# Patient Record
Sex: Male | Born: 1975 | Race: White | Hispanic: No | Marital: Married | Smoking: Former smoker
Health system: Southern US, Community
[De-identification: ages and names within clinical notes are randomized; demographics above are authoritative.]

## PROBLEM LIST (undated history)

## (undated) DIAGNOSIS — E119 Type 2 diabetes mellitus without complications: Secondary | ICD-10-CM

## (undated) DIAGNOSIS — Z9989 Dependence on other enabling machines and devices: Secondary | ICD-10-CM

## (undated) DIAGNOSIS — G4733 Obstructive sleep apnea (adult) (pediatric): Secondary | ICD-10-CM

## (undated) DIAGNOSIS — R519 Headache, unspecified: Secondary | ICD-10-CM

## (undated) DIAGNOSIS — I10 Essential (primary) hypertension: Secondary | ICD-10-CM

## (undated) DIAGNOSIS — G8929 Other chronic pain: Secondary | ICD-10-CM

## (undated) DIAGNOSIS — R51 Headache: Secondary | ICD-10-CM

## (undated) HISTORY — DX: Headache, unspecified: R51.9

## (undated) HISTORY — DX: Obstructive sleep apnea (adult) (pediatric): G47.33

## (undated) HISTORY — PX: APPENDECTOMY: SHX54

## (undated) HISTORY — DX: Type 2 diabetes mellitus without complications: E11.9

## (undated) HISTORY — DX: Essential (primary) hypertension: I10

## (undated) HISTORY — DX: Dependence on other enabling machines and devices: Z99.89

## (undated) HISTORY — DX: Headache: R51

## (undated) HISTORY — DX: Other chronic pain: G89.29

---

## 2008-10-06 LAB — HM DIABETES EYE EXAM: HM Diabetic Eye Exam: NORMAL

## 2009-01-28 ENCOUNTER — Ambulatory Visit (HOSPITAL_BASED_OUTPATIENT_CLINIC_OR_DEPARTMENT_OTHER): Admission: RE | Admit: 2009-01-28 | Discharge: 2009-01-28 | Payer: Self-pay | Admitting: Neurology

## 2009-01-28 ENCOUNTER — Encounter: Payer: Self-pay | Admitting: Internal Medicine

## 2009-02-05 ENCOUNTER — Ambulatory Visit: Payer: Self-pay | Admitting: Internal Medicine

## 2009-03-04 ENCOUNTER — Ambulatory Visit: Payer: Self-pay | Admitting: Internal Medicine

## 2009-03-04 DIAGNOSIS — G4730 Sleep apnea, unspecified: Secondary | ICD-10-CM

## 2009-03-04 DIAGNOSIS — G4710 Hypersomnia, unspecified: Secondary | ICD-10-CM | POA: Insufficient documentation

## 2009-03-11 DIAGNOSIS — J309 Allergic rhinitis, unspecified: Secondary | ICD-10-CM | POA: Insufficient documentation

## 2009-03-25 ENCOUNTER — Encounter: Payer: Self-pay | Admitting: Internal Medicine

## 2009-04-07 ENCOUNTER — Encounter: Payer: Self-pay | Admitting: Internal Medicine

## 2009-04-12 ENCOUNTER — Ambulatory Visit: Payer: Self-pay | Admitting: Internal Medicine

## 2009-04-12 DIAGNOSIS — I10 Essential (primary) hypertension: Secondary | ICD-10-CM

## 2009-04-12 DIAGNOSIS — I152 Hypertension secondary to endocrine disorders: Secondary | ICD-10-CM | POA: Insufficient documentation

## 2009-04-12 DIAGNOSIS — E1159 Type 2 diabetes mellitus with other circulatory complications: Secondary | ICD-10-CM | POA: Insufficient documentation

## 2009-04-12 LAB — CONVERTED CEMR LAB
AST: 34 units/L (ref 0–37)
Alkaline Phosphatase: 58 units/L (ref 39–117)
Basophils Absolute: 0.1 10*3/uL (ref 0.0–0.1)
Bilirubin, Direct: 0.2 mg/dL (ref 0.0–0.3)
CO2: 29 meq/L (ref 19–32)
Chloride: 105 meq/L (ref 96–112)
Creatinine, Ser: 0.7 mg/dL (ref 0.4–1.5)
HCT: 41.1 % (ref 39.0–52.0)
Hemoglobin: 14 g/dL (ref 13.0–17.0)
Lymphocytes Relative: 26 % (ref 12.0–46.0)
MCHC: 34.1 g/dL (ref 30.0–36.0)
MCV: 86.8 fL (ref 78.0–100.0)
Monocytes Relative: 9.6 % (ref 3.0–12.0)
Potassium: 3.8 meq/L (ref 3.5–5.1)
RBC: 4.74 M/uL (ref 4.22–5.81)
Specific Gravity, Urine: >=1.03 (ref 1.000–1.030)
TSH: 1.21 microintl units/mL (ref 0.35–5.50)
Total Bilirubin: 0.3 mg/dL (ref 0.3–1.2)
Urine Glucose: NEGATIVE mg/dL
Urobilinogen, UA: 0.2 (ref 0.0–1.0)
pH: 5.5 (ref 5.0–8.0)

## 2009-04-25 ENCOUNTER — Encounter (INDEPENDENT_AMBULATORY_CARE_PROVIDER_SITE_OTHER): Payer: Self-pay | Admitting: *Deleted

## 2009-04-25 ENCOUNTER — Ambulatory Visit: Payer: Self-pay | Admitting: Internal Medicine

## 2009-05-10 ENCOUNTER — Encounter: Payer: Self-pay | Admitting: Internal Medicine

## 2009-05-26 ENCOUNTER — Encounter: Payer: Self-pay | Admitting: Internal Medicine

## 2009-06-03 ENCOUNTER — Ambulatory Visit: Payer: Self-pay | Admitting: Internal Medicine

## 2009-06-03 DIAGNOSIS — D485 Neoplasm of uncertain behavior of skin: Secondary | ICD-10-CM | POA: Insufficient documentation

## 2009-06-18 ENCOUNTER — Encounter: Payer: Self-pay | Admitting: Internal Medicine

## 2009-06-21 ENCOUNTER — Telehealth: Payer: Self-pay | Admitting: Internal Medicine

## 2009-06-28 ENCOUNTER — Ambulatory Visit: Payer: Self-pay | Admitting: Internal Medicine

## 2009-06-28 LAB — CONVERTED CEMR LAB
BUN: 6 mg/dL (ref 6–23)
Chloride: 103 meq/L (ref 96–112)
Direct LDL: 104.7 mg/dL
Hgb A1c MFr Bld: 6.5 % (ref 4.6–6.5)
Potassium: 4.2 meq/L (ref 3.5–5.1)
VLDL: 64.8 mg/dL — ABNORMAL HIGH (ref 0.0–40.0)

## 2009-08-16 ENCOUNTER — Ambulatory Visit: Payer: Self-pay | Admitting: Internal Medicine

## 2009-08-16 DIAGNOSIS — F329 Major depressive disorder, single episode, unspecified: Secondary | ICD-10-CM

## 2009-08-16 DIAGNOSIS — F3289 Other specified depressive episodes: Secondary | ICD-10-CM | POA: Insufficient documentation

## 2009-08-16 LAB — CONVERTED CEMR LAB
Leukocytes, UA: NEGATIVE
Nitrite: NEGATIVE
Specific Gravity, Urine: >=1.03 (ref 1.000–1.030)
Urine Glucose: >=1000 mg/dL
Urobilinogen, UA: 0.2 (ref 0.0–1.0)

## 2009-08-25 ENCOUNTER — Ambulatory Visit: Payer: Self-pay | Admitting: Professional

## 2009-09-21 ENCOUNTER — Encounter: Payer: Self-pay | Admitting: Internal Medicine

## 2009-09-28 ENCOUNTER — Ambulatory Visit: Payer: Self-pay | Admitting: Internal Medicine

## 2009-09-28 ENCOUNTER — Encounter (INDEPENDENT_AMBULATORY_CARE_PROVIDER_SITE_OTHER): Payer: Self-pay | Admitting: *Deleted

## 2009-09-28 LAB — CONVERTED CEMR LAB
ALT: 48 units/L (ref 0–53)
AST: 35 units/L (ref 0–37)
Basophils Relative: 0.5 % (ref 0.0–3.0)
Bilirubin Urine: NEGATIVE
Bilirubin, Direct: 0.1 mg/dL (ref 0.0–0.3)
Chloride: 103 meq/L (ref 96–112)
Direct LDL: 105 mg/dL
Eosinophils Relative: 5.5 % — ABNORMAL HIGH (ref 0.0–5.0)
HCT: 42.4 % (ref 39.0–52.0)
Hgb A1c MFr Bld: 6.6 % — ABNORMAL HIGH (ref 4.6–6.5)
Ketones, ur: NEGATIVE mg/dL
Leukocytes, UA: NEGATIVE
Lymphs Abs: 1.9 10*3/uL (ref 0.7–4.0)
MCV: 86.7 fL (ref 78.0–100.0)
Monocytes Absolute: 0.6 10*3/uL (ref 0.1–1.0)
Monocytes Relative: 8.8 % (ref 3.0–12.0)
Neutrophils Relative %: 58.3 % (ref 43.0–77.0)
Potassium: 4.2 meq/L (ref 3.5–5.1)
RBC: 4.89 M/uL (ref 4.22–5.81)
Sodium: 143 meq/L (ref 135–145)
Total CHOL/HDL Ratio: 6
Total Protein: 8 g/dL (ref 6.0–8.3)
Urine Glucose: NEGATIVE mg/dL
VLDL: 55 mg/dL — ABNORMAL HIGH (ref 0.0–40.0)
WBC: 7.2 10*3/uL (ref 4.5–10.5)
pH: 6 (ref 5.0–8.0)

## 2009-09-29 ENCOUNTER — Encounter: Payer: Self-pay | Admitting: Internal Medicine

## 2009-10-03 ENCOUNTER — Encounter: Payer: Self-pay | Admitting: Internal Medicine

## 2009-10-03 ENCOUNTER — Ambulatory Visit: Payer: Self-pay | Admitting: Internal Medicine

## 2009-10-03 DIAGNOSIS — J45909 Unspecified asthma, uncomplicated: Secondary | ICD-10-CM | POA: Insufficient documentation

## 2009-10-03 DIAGNOSIS — R05 Cough: Secondary | ICD-10-CM

## 2009-10-03 DIAGNOSIS — R059 Cough, unspecified: Secondary | ICD-10-CM | POA: Insufficient documentation

## 2009-10-07 ENCOUNTER — Ambulatory Visit: Payer: Self-pay | Admitting: Internal Medicine

## 2009-11-01 ENCOUNTER — Telehealth: Payer: Self-pay | Admitting: Internal Medicine

## 2009-11-10 ENCOUNTER — Telehealth: Payer: Self-pay | Admitting: Internal Medicine

## 2010-03-16 ENCOUNTER — Emergency Department (HOSPITAL_COMMUNITY)
Admission: EM | Admit: 2010-03-16 | Discharge: 2010-03-16 | Payer: Self-pay | Source: Home / Self Care | Admitting: Emergency Medicine

## 2010-03-20 LAB — DIFFERENTIAL
Basophils Absolute: 0 10*3/uL (ref 0.0–0.1)
Basophils Relative: 1 % (ref 0–1)
Eosinophils Absolute: 0.3 10*3/uL (ref 0.0–0.7)
Lymphocytes Relative: 22 % (ref 12–46)
Lymphs Abs: 1.7 10*3/uL (ref 0.7–4.0)
Monocytes Absolute: 0.6 10*3/uL (ref 0.1–1.0)
Neutrophils Relative %: 66 % (ref 43–77)

## 2010-03-20 LAB — POCT I-STAT, CHEM 8
BUN: 12 mg/dL (ref 6–23)
Chloride: 106 mEq/L (ref 96–112)
Glucose, Bld: 248 mg/dL — ABNORMAL HIGH (ref 70–99)
Hemoglobin: 14.3 g/dL (ref 13.0–17.0)
Potassium: 3.9 mEq/L (ref 3.5–5.1)
Sodium: 141 mEq/L (ref 135–145)
TCO2: 24 mmol/L (ref 0–100)

## 2010-03-20 LAB — CBC
Hemoglobin: 13.9 g/dL (ref 13.0–17.0)
MCH: 28.7 pg (ref 26.0–34.0)
RBC: 4.85 MIL/uL (ref 4.22–5.81)

## 2010-03-22 ENCOUNTER — Ambulatory Visit
Admission: RE | Admit: 2010-03-22 | Discharge: 2010-03-22 | Payer: Self-pay | Source: Home / Self Care | Attending: Internal Medicine | Admitting: Internal Medicine

## 2010-03-22 ENCOUNTER — Other Ambulatory Visit: Payer: Self-pay | Admitting: Internal Medicine

## 2010-03-22 ENCOUNTER — Encounter: Payer: Self-pay | Admitting: Internal Medicine

## 2010-03-22 DIAGNOSIS — G8929 Other chronic pain: Secondary | ICD-10-CM | POA: Insufficient documentation

## 2010-03-22 DIAGNOSIS — M25559 Pain in unspecified hip: Secondary | ICD-10-CM | POA: Insufficient documentation

## 2010-03-22 DIAGNOSIS — M25569 Pain in unspecified knee: Secondary | ICD-10-CM

## 2010-03-22 DIAGNOSIS — I880 Nonspecific mesenteric lymphadenitis: Secondary | ICD-10-CM | POA: Insufficient documentation

## 2010-03-22 LAB — URINALYSIS, ROUTINE W REFLEX MICROSCOPIC
Bilirubin Urine: NEGATIVE
Leukocytes, UA: NEGATIVE
Urine Glucose: 100
pH: 6 (ref 5.0–8.0)

## 2010-03-22 LAB — CBC WITH DIFFERENTIAL/PLATELET
Basophils Absolute: 0 10*3/uL (ref 0.0–0.1)
Eosinophils Relative: 5.1 % — ABNORMAL HIGH (ref 0.0–5.0)
Lymphs Abs: 2 10*3/uL (ref 0.7–4.0)
Monocytes Absolute: 0.9 10*3/uL (ref 0.1–1.0)
Monocytes Relative: 9.6 % (ref 3.0–12.0)
Neutro Abs: 5.6 10*3/uL (ref 1.4–7.7)
Neutrophils Relative %: 62.1 % (ref 43.0–77.0)
Platelets: 278 10*3/uL (ref 150.0–400.0)
WBC: 9 10*3/uL (ref 4.5–10.5)

## 2010-03-22 LAB — HEPATIC FUNCTION PANEL
ALT: 46 U/L (ref 0–53)
AST: 34 U/L (ref 0–37)
Albumin: 4.2 g/dL (ref 3.5–5.2)
Alkaline Phosphatase: 67 U/L (ref 39–117)
Bilirubin, Direct: 0.1 mg/dL (ref 0.0–0.3)
Total Protein: 7.8 g/dL (ref 6.0–8.3)

## 2010-03-22 LAB — BASIC METABOLIC PANEL
BUN: 14 mg/dL (ref 6–23)
GFR: 123.2 mL/min (ref >60.00)
Glucose, Bld: 188 mg/dL — ABNORMAL HIGH (ref 70–99)
Potassium: 4.2 mEq/L (ref 3.5–5.1)

## 2010-03-22 LAB — SEDIMENTATION RATE: Sed Rate: 25 mm/hr — ABNORMAL HIGH (ref 0–22)

## 2010-03-22 LAB — TSH: TSH: 2.09 u[IU]/mL (ref 0.35–5.50)

## 2010-03-22 LAB — CONVERTED CEMR LAB
Alpha-1-Globulin: 4.3 % (ref 2.9–4.9)
Gamma Globulin: 15.8 % (ref 11.1–18.8)

## 2010-03-22 LAB — HEMOGLOBIN A1C: Hgb A1c MFr Bld: 8.3 % — ABNORMAL HIGH (ref 4.6–6.5)

## 2010-03-23 ENCOUNTER — Telehealth: Payer: Self-pay | Admitting: Internal Medicine

## 2010-03-28 NOTE — Letter (Signed)
Summary: Results Follow-up Letter  [facility]  [address] [place], [place] [address]   Phone: [phone]  Fax: [phone]    04/12/2009  [address] [place], [place]  [address]  Dear [name],   The following are the results of your recent test(s):  Test     Result     Blood sugar     194 A1C=7.2     decent control Liver/kidney   normal Urine       normal Thyroid     normal CBC       normal   _________________________________________________________  Please call for an appointment as directed _________________________________________________________ _________________________________________________________ _________________________________________________________  Sincerely,  Sanda Linger MD [facility]

## 2010-03-28 NOTE — Assessment & Plan Note (Signed)
Summary: NEW/ [employer]ET[employer] / NWS  #   Vital Signs:  Patient profile:   35 year old male Height:      69 inches (175.26 cm) Weight:      286 pounds (130 kg) BMI:     42.39 O2 Sat:      97 % on Room air Temp:     97.5 degrees F (36.39 degrees C) oral Pulse rate:   69 / minute Pulse rhythm:   regular Resp:     16 per minute BP sitting:   146 / 88  (left arm) Cuff size:   large  Vitals Entered By: Rock Nephew CMA (April 12, 2009 8:50 AM)  Nutrition Counseling: Patient's BMI is greater than 25 and therefore counseled on weight management options.  O2 Flow:  Room air CC: New pt here to establish care/allergies/growth on left arm/aj   Primary Care Provider:  [name]  CC:  New pt here to establish care/allergies/growth on left arm/aj.  History of Present Illness: New to me with complaints.... 1. Has been on CPAP for 3 weeks but he still has excessive daytime sleepiness, weight gain, fatigue, and lack of exercise. 2. He has had a raised bump on left forearm for several months. 3. allergies  Preventive Screening-Counseling & Management  Alcohol-Tobacco     Alcohol drinks/day: 0     Smoking Status: never  Hep-HIV-STD-Contraception     Hepatitis Risk: no risk noted     HIV Risk: no risk noted     STD Risk: no risk noted     SBE monthly: yes      Sexual History:  currently monogamous.        Drug Use:  never.        Blood Transfusions:  no.    Medications Prior to Update: 1)  Atenolol 50 Mg Tabs (Atenolol) .... Take 2 By Mouth Once Daily  Current Medications (verified): 1)  Atenolol 50 Mg Tabs (Atenolol) .... Take 2 By Mouth Once Daily  Allergies (verified): 1)  ! Pcn  Past History:  Past Medical History: Obstructive Sleep Apnea NPSG 01/28/09- AHI 37.8; CPAP 13- AHI 0/hr Allergic Rhinitis Chronic headache Diabetes mellitus, type II Hypertension Allergic rhinitis  Social History: Smoking Status:  never Hepatitis Risk:  no risk noted HIV Risk:  no  risk noted STD Risk:  no risk noted Sexual History:  currently monogamous Drug Use:  never Blood Transfusions:  no  Review of Systems       The patient complains of weight gain.  The patient denies anorexia, fever, weight loss, chest pain, syncope, dyspnea on exertion, peripheral edema, prolonged cough, headaches, hemoptysis, abdominal pain, hematuria, difficulty walking, depression, and angioedema.   ENT:  Complains of nasal congestion and postnasal drainage; denies decreased hearing, ear discharge, hoarseness, nosebleeds, ringing in ears, sinus pressure, and sore throat. Resp:  Complains of excessive snoring and hypersomnolence; denies chest discomfort, chest pain with inspiration, cough, coughing up blood, pleuritic, shortness of breath, sputum productive, and wheezing. Endo:  Complains of weight change; denies cold intolerance, excessive hunger, excessive thirst, excessive urination, heat intolerance, and polyuria.  Physical Exam  General:  alert, well-developed, well-nourished, well-hydrated, and overweight-appearing.   Head:  normocephalic, atraumatic, no abnormalities observed, and no abnormalities palpated.   Eyes:  vision grossly intact, pupils equal, pupils round, and pupils reactive to light.   Ears:  R ear normal and L ear normal.   Mouth:  Oral mucosa and oropharynx without lesions or  exudates.  Teeth in good repair. Neck:  supple, full ROM, no masses, no carotid bruits, and no neck tenderness.   Lungs:  Normal respiratory effort, chest expands symmetrically. Lungs are clear to auscultation, no crackles or wheezes. Heart:  Normal rate and regular rhythm. S1 and S2 normal without gallop, murmur, click, rub or other extra sounds. Abdomen:  soft, non-tender, normal bowel sounds, no distention, no masses, no guarding, no rigidity, no rebound tenderness, no hepatomegaly, and no splenomegaly.   Msk:  normal ROM, no joint tenderness, no joint swelling, no joint warmth, and no redness  over joints.   Pulses:  R and L carotid,radial,femoral,dorsalis pedis and posterior tibial pulses are full and equal bilaterally Extremities:  No clubbing, cyanosis, edema, or deformity noted with normal full range of motion of all joints.   Neurologic:  No cranial nerve deficits noted. Station and gait are normal. Plantar reflexes are down-going bilaterally. DTRs are symmetrical throughout. Sensory, motor and coordinative functions appear intact. Skin:  on the left upper forearm there is a purple, pendunculated lesion that is round and 4 mm in width and 1 cm in heighth. Cervical Nodes:  no anterior cervical adenopathy and no posterior cervical adenopathy.   Axillary Nodes:  no R axillary adenopathy and no L axillary adenopathy.   Psych:  Cognition and judgment appear intact. Alert and cooperative with normal attention span and concentration. No apparent delusions, illusions, hallucinations  Diabetes Management Exam:    Foot Exam (with socks and/or shoes not present):       Sensory-Pinprick/Light touch:          Left medial foot (L-4): normal          Left dorsal foot (L-5): normal          Left lateral foot (S-1): normal          Right medial foot (L-4): normal          Right dorsal foot (L-5): normal          Right lateral foot (S-1): normal       Sensory-Monofilament:          Left foot: normal          Right foot: normal       Inspection:          Left foot: normal          Right foot: normal       Nails:          Left foot: normal          Right foot: normal    Eye Exam:       Eye Exam done elsewhere          Date: 10/06/2008          Results: normal          Done by: [employer]   Impression & Recommendations:  Problem # 1:  NEOPLASM OF UNCERTAIN BEHAVIOR OF SKIN (ICD-238.2) Assessment New  Orders: Dermatology Referral (Derma)  Problem # 2:  HYPERTENSION (ICD-401.9) Assessment: Unchanged  His updated medication list for this problem includes:    Atenolol 50 Mg  Tabs (Atenolol) .Marland Kitchen... Take 2 by mouth once daily  Orders: Venipuncture (91478) TLB-BMP (Basic Metabolic Panel-BMET) (80048-METABOL) TLB-CBC Platelet - w/Differential (85025-CBCD) TLB-Hepatic/Liver Function Pnl (80076-HEPATIC) TLB-TSH (Thyroid Stimulating Hormone) (84443-TSH) TLB-A1C / Hgb A1C (Glycohemoglobin) (83036-A1C) TLB-Udip w/ Micro (81001-URINE) TLB-Microalbumin/Creat Ratio, Urine (82043-MALB) Venipuncture (29562) TLB-BMP (Basic Metabolic Panel-BMET) (80048-METABOL) TLB-CBC Platelet - w/Differential (85025-CBCD)  TLB-Hepatic/Liver Function Pnl (80076-HEPATIC) TLB-TSH (Thyroid Stimulating Hormone) (84443-TSH) TLB-A1C / Hgb A1C (Glycohemoglobin) (83036-A1C) TLB-Udip w/ Micro (81001-URINE)  BP today: 146/88 Prior BP: 122/84 (03/04/2009)  Problem # 3:  DIABETES MELLITUS, TYPE II (ICD-250.00) Assessment: Unchanged  Orders: Venipuncture (10175) TLB-BMP (Basic Metabolic Panel-BMET) (80048-METABOL) TLB-CBC Platelet - w/Differential (85025-CBCD) TLB-Hepatic/Liver Function Pnl (80076-HEPATIC) TLB-TSH (Thyroid Stimulating Hormone) (84443-TSH) TLB-A1C / Hgb A1C (Glycohemoglobin) (83036-A1C) TLB-Udip w/ Micro (81001-URINE) TLB-Microalbumin/Creat Ratio, Urine (82043-MALB) Venipuncture (10258) TLB-BMP (Basic Metabolic Panel-BMET) (80048-METABOL) TLB-CBC Platelet - w/Differential (85025-CBCD) TLB-Hepatic/Liver Function Pnl (80076-HEPATIC) TLB-TSH (Thyroid Stimulating Hormone) (84443-TSH) TLB-A1C / Hgb A1C (Glycohemoglobin) (83036-A1C) TLB-Udip w/ Micro (81001-URINE)  Problem # 4:  HYPERSOMNIA WITH SLEEP APNEA UNSPECIFIED (ICD-780.53) Assessment: Deteriorated start Nuvigil Orders: Venipuncture (52778) TLB-BMP (Basic Metabolic Panel-BMET) (80048-METABOL) TLB-CBC Platelet - w/Differential (85025-CBCD) TLB-Hepatic/Liver Function Pnl (80076-HEPATIC) TLB-TSH (Thyroid Stimulating Hormone) (84443-TSH) TLB-A1C / Hgb A1C (Glycohemoglobin) (83036-A1C) TLB-Udip w/ Micro  (81001-URINE) TLB-Microalbumin/Creat Ratio, Urine (82043-MALB) Sleep Disorder Referral (Sleep Disorder) Venipuncture (24235) TLB-BMP (Basic Metabolic Panel-BMET) (80048-METABOL) TLB-CBC Platelet - w/Differential (85025-CBCD) TLB-Hepatic/Liver Function Pnl (80076-HEPATIC) TLB-TSH (Thyroid Stimulating Hormone) (84443-TSH) TLB-A1C / Hgb A1C (Glycohemoglobin) (83036-A1C) TLB-Udip w/ Micro (81001-URINE)  Problem # 5:  ALLERGIC RHINITIS (ICD-477.9) Assessment: New  His updated medication list for this problem includes:    Allegra 180 Mg Tabs (Fexofenadine hcl) ..... One by mouth once daily for allergies  Discussed use of allergy medications and environmental measures.   Complete Medication List: 1)  Atenolol 50 Mg Tabs (Atenolol) .... Take 2 by mouth once daily 2)  Allegra 180 Mg Tabs (Fexofenadine hcl) .... One by mouth once daily for allergies 3)  Nuvigil 150 Mg Tabs (Armodafinil) .... Once daily for sleepiness  Patient Instructions: 1)  Please schedule a follow-up appointment in 2 weeks. 2)  It is important that you exercise regularly at least 20 minutes 5 times a week. If you develop chest pain, have severe difficulty breathing, or feel very tired , stop exercising immediately and seek medical attention. 3)  You need to lose weight. Consider a lower calorie diet and regular exercise.  4)  Check your blood sugars regularly. If your readings are usually above  200 or below 70 you should contact our office. 5)  It is important that your Diabetic A1c level is checked every 3 months. 6)  See your eye doctor yearly to check for diabetic eye damage. 7)  Check your feet each night for sore areas, calluses or signs of infection. 8)  Check your Blood Pressure regularly. If it is above 130/80: you should make an appointment. Prescriptions: NUVIGIL 150 MG TABS (ARMODAFINIL) once daily for sleepiness  #21 x 0   Entered and Authorized by:   [name]   Signed by:   [name] on  04/12/2009   Method used:   Samples Given   RxID:   [id number] ALLEGRA 180 MG TABS (FEXOFENADINE HCL) one by mouth once daily for allergies  #30 x 11   Entered and Authorized by:   [name]   Signed by:   [name] on 04/12/2009   Method used:   Electronically to        [facility] (retail)       [address]       [place], [place]  [address]       Ph: [phone] or [phone]       Fax: [phone]   RxID:   [id number]

## 2010-03-28 NOTE — Assessment & Plan Note (Signed)
Summary: 1 month/apc   Copy to:  Dr Clarisse Gouge Primary Provider/Referring Provider:  [name]  CC:  1 mointh followup.  Pt states that CPAP works well.  He is averaging 7-8 hours of sleep every night and feels much better rested during the day.Marland Kitchen  History of Present Illness:  March 04, 2009- 33 yoM referred courtesy of Dr Clarisse Gouge with obstructive sleep apnea. He complains that he is always tired with trouble staying aeake in daytime. Wife complains of his snoring and he wakes with dry  mouth. He lets his wife drive. Recently quit caffeine due to headaches.  Bedtime 10-11 PM, sleep latency 30 minutes. Restless sleep with frequent waking to roll over, up 7 AM. Weight gain 50 lbs/ 2 years. No ENT surgery. NPSG done 01/28/09- AHI 37.8. CPAP titrated to 13 cwp for AHI 0/hr.  April 25, 2009- OSA He has been using cpap regularly with good compliance and control. I will review download  when available. He had transient ear popping  but that has resolved.  Pressure 13 through [facility]. Has full face mask. It also relieved nasal congestion. Wife hasn't reported concerns. He sleeps well and feels rested.      Current Medications (verified): 1)  Atenolol 50 Mg Tabs (Atenolol) .... Take One By Mouth Bid 2)  Allegra 180 Mg Tabs (Fexofenadine Hcl) .... One By Mouth Once Daily For Allergies 3)  Nuvigil 150 Mg Tabs (Armodafinil) .... Once Daily For Sleepiness 4)  Janumet 50-500 Mg Tabs (Sitagliptin-Metformin Hcl) .... One By Mouth Two Times A Day For Diabetes 5)  Altace 5 Mg Caps (Ramipril) .... One By Mouth Once Daily For Blood Pressure  Allergies (verified): 1)  ! 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    Vital Signs:  Patient profile:   35 year old male Weight:      287 pounds O2 Sat:      95 % on Room air Pulse rate:   70 / minute BP sitting:   140 / 80  (left arm) Cuff size:   large  Vitals Entered By: Vernie Murders (April 25, 2009 3:26 PM)  O2 Flow:  Room air  Physical Exam  Additional Exam:  General: A/Ox3; pleasant and cooperative, NAD, beard heavy set SKIN: no rash, lesions NODES: no lymphadenopathy HEENT: [place]/AT, EOM- WNL, Conjuctivae- clear, PERRLA, TM-WNL, Nose- mild stuffiness, Throat- clear and wnl, Mallampati  III NECK: Supple w/ fair ROM, JVD- none, normal carotid impulses w/o bruits Thyroid- CHEST: Clear to P&A HEART: RRR, no m/g/r heard ABDOMEN: Soft and nl; QIH:KVQQ, nl pulses, no edema  NEURO: Grossly intact to observation      Impression & Recommendations:  Problem # 1:  HYPERSOMNIA WITH SLEEP APNEA UNSPECIFIED (ICD-780.53)  Excellent initial complaince and control at 13 cwp. We can continue without changes needed. We again discussed cpap therapy and sleep hygiene.  Medications Added to Medication List This Visit: 1)  Cpap 13 Hcs   Other Orders: Est. Patient Level II ([address])  Patient Instructions: 1)  Please schedule a follow-up appointment in 6 months. 2)  Continue with CPAP at 13. Let me know if there are problems.

## 2010-03-28 NOTE — Letter (Signed)
Summary: Results Follow-up Letter  [facility]  [address] [place], [place] [address]   Phone: [phone]  Fax: [phone]    09/29/2009  [address] [place], [place]  [address]  Dear Mr. SENSABAUGH,   The following are the results of your recent test(s):  Test     Result     Blood sugars   good control Liver/kidney   normal CBC       normal Thyroid     normal Urine       normal   _________________________________________________________  Please call for an appointment as directed _________________________________________________________ _________________________________________________________ _________________________________________________________  Sincerely,  Sanda Linger MD [facility]

## 2010-03-28 NOTE — Assessment & Plan Note (Signed)
Summary: sleep apnea/ mbw   Copy to:  Dr Clarisse Gouge Primary Provider/Referring Provider:  Modena Slater Jones/ [facility]   History of Present Illness: March 04, 2009- 35 yoM referred courtesy of Dr Clarisse Gouge with obstructive sleep apnea. He complains that he is always tired with trouble staying aeake in daytime. Wife complains of his snoring and he wakes with dry  mouth. He lets his wife drive. Recently quit caffeine due to headaches.  Bedtime 10-11 PM, sleep latency 30 minutes. Restless sleep with frequent waking to roll over, up 7 AM. Weight gain 50 lbs/ 2 years. No ENT surgery. NPSG done 01/28/09- AHI 37.8. CPAP titrated to 13 cwp for AHI 0/hr.  Preventive Screening-Counseling & Management  Alcohol-Tobacco     Smoking Status: current  Current Medications (verified): 1)  Atenolol 50 Mg Tabs (Atenolol) .... Take 2 By Mouth Once Daily  Allergies (verified): 1)  ! 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  The patient denies shortness of breath at rest, productive cough,  non-productive cough, coughing up blood, chest pain, irregular heartbeats, acid heartburn, indigestion, abdominal pain, difficulty swallowing, sore throat, tooth/dental problems, itching, ear ache, anxiety, depression, hand/feet swelling, rash, change in color of mucus, and fever.    Vital Signs:  Patient profile:   35 year old male Height:      69 inches Weight:      292.38 pounds BMI:     43.33 O2 Sat:      98 % on Room air Pulse rate:   76 / minute BP sitting:   122 / 84  (right arm) Cuff size:   large  O2 Flow:  Room air  Physical Exam  Additional Exam:  General: A/Ox3; pleasant and cooperative, NAD, beard SKIN: no rash, lesions NODES: no lymphadenopathy HEENT: [place]/AT, EOM- WNL, Conjuctivae- clear, PERRLA, TM-WNL, Nose- clear, Throat- clear and wnl, [place]  III NECK: Supple w/ fair ROM, JVD- none, normal carotid impulses w/o bruits Thyroid- normal to palpation CHEST: Clear to P&A HEART: RRR, no m/g/r heard ABDOMEN: Soft and nl; UJW:JXBJ, nl pulses, no edema  NEURO: Grossly intact to observation      Impression & Recommendations:  Problem # 1:  HYPERSOMNIA WITH SLEEP APNEA UNSPECIFIED (ICD-780.53)  He will be a good candidate for cpap, but may need to work to get a mask seal with his beard. We have discussed weight, sleep hygiene, driving responsibility, and the physiology and treatments for sleep apnea.  Medications Added to Medication List This Visit: 1)  Atenolol 50 Mg Tabs (Atenolol) .... Take 2 by mouth once daily  Other Orders: Consultation Level III ([address]) DME Referral (DME)  Patient Instructions: 1)  Please schedule a follow-up appointment in 1 month. 2)  See PCC to start CPAP. Call if you have problems.

## 2010-03-28 NOTE — Letter (Signed)
Summary: [facility] Consult Scheduled Letter  [facility]  [address] [place], [place] [address]   Phone: [phone]  Fax: [phone]      09/28/2009 MRN: [id number]  Robert Gamble [address] [place], [place]  [address]    Dear Mr. KIRTZ,      We have scheduled an appointment for you. At the recommendation of Dr.Thomas Jones we have scheduled you a consult with ([facility]) DR.Young on August 30,2011 Tue at 3:15pm.Their phone number is [phone].If this appointment day and time is not convenient for you, please feel free to call the office of the doctor you are being referred to at the number listed above and reschedule the appointment.  **Please give 24hr notice if you need to Cancel/Reschedule to avoid a $50.00 fee. also Bring insurancce card and any co-pay due at time of visit** .   [facility]-2nd floor [address]  [place] [address]  Thank you,  Patient Care Coordinator [facility]

## 2010-03-28 NOTE — Letter (Signed)
Summary: CMN for CPAP Supplies/[facility]  CMN for CPAP Supplies/[facility]   Imported By: Sherian Rein 05/13/2009 11:03:42  _____________________________________________________________________  External Attachment:    Type:   Image     Comment:   External Document

## 2010-03-28 NOTE — Assessment & Plan Note (Signed)
Summary: [facility]  [facility]   Vital Signs:  Patient profile:   35 year old male Height:      69 inches Weight:      279 pounds BMI:     41.35 O2 Sat:      97 % on Room air Temp:     97.1 degrees F oral Pulse rate:   82 / minute Pulse rhythm:   regular Resp:     16 per minute BP sitting:   130 / 78  (left arm) Cuff size:   large  Vitals Entered By: Lamar Sprinkles, CMA (June 03, 2009 8:09 AM)  Nutrition Counseling: Patient's BMI is greater than 25 and therefore counseled on weight management options.  O2 Flow:  Room air CC: f/u from testing by Dr Vela Prose, increased pain in left leg   Primary Care Provider:  [name]  CC:  f/u from testing by Dr Vela Prose and increased pain in left leg.  History of Present Illness: He returns for f/up and complains of worsening symptoms in his left thigh with sharp, shooting pain. Dr. 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  He also comments about lesion on his left elbow that has not been shown to a Dermatologist yet.  Current Medications (verified): 1)  Atenolol 50 Mg Tabs (Atenolol) .... Take One By Mouth Two Times A Day 2)  Allegra 180 Mg Tabs (Fexofenadine Hcl) .... One By Mouth Once Daily For Allergies 3)  Nuvigil 150 Mg Tabs (Armodafinil) .... Once Daily For Sleepiness 4)  Janumet 50-500 Mg Tabs (Sitagliptin-Metformin Hcl) .... One By Mouth Two Times A Day For Diabetes 5)  Altace 5 Mg Caps (Ramipril) .... One By Mouth Once Daily For Blood Pressure 6)  Cpap 13 Hcs  Allergies (verified): 1)  ! Pcn  Past History:  Past Medical History: Reviewed history from 04/12/2009 and no changes required. Obstructive Sleep Apnea NPSG 01/28/09- AHI 37.8; CPAP 13- AHI 0/hr Allergic Rhinitis Chronic headache Diabetes mellitus, type II Hypertension Allergic rhinitis  Past Surgical History: Reviewed history from 03/04/2009 and no changes required. None  Family History: Reviewed history from  03/04/2009 and no changes required. Parents living Sister- OSA/ CPAP Lung Cancer, clotting disorder, emphysema, heart disease  Social History: Reviewed history from 03/04/2009 and no changes required. 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 1/2 ppd Quit ETOH, Street drugs  Review of Systems  The patient denies anorexia, fever, weight loss, weight gain, chest pain, syncope, dyspnea on exertion, peripheral edema, prolonged cough, headaches, hemoptysis, abdominal pain, hematuria, suspicious skin lesions, and enlarged lymph nodes.    Physical Exam  General:  alert, well-developed, well-nourished, well-hydrated, and overweight-appearing.   Head:  normocephalic, atraumatic, no abnormalities observed, and no abnormalities palpated.   Eyes:  vision grossly intact, pupils equal, pupils round, and pupils reactive to light.   Mouth:  Oral mucosa and oropharynx without lesions or exudates.  Teeth in good repair. Neck:  supple, full ROM, no masses, no carotid bruits, and no neck tenderness.   Lungs:  Normal respiratory effort, chest expands symmetrically. Lungs are clear to auscultation, no crackles or wheezes. Heart:  Normal rate and regular rhythm. S1 and S2 normal without gallop, murmur, click, rub or other extra sounds. Abdomen:  soft, non-tender, normal bowel sounds, no distention, no masses, no guarding, no rigidity, no rebound tenderness, no hepatomegaly, and no splenomegaly.   Msk:  normal ROM, no joint  tenderness, no joint swelling, no joint warmth, and no redness over joints.   Pulses:  R and L carotid,radial,femoral,dorsalis pedis and posterior tibial pulses are full and equal bilaterally Extremities:  No clubbing, cyanosis, edema, or deformity noted with normal full range of motion of all joints.   Neurologic:  LLE sensory loss over the left lateral thigh.  alert & oriented X3, cranial nerves II-XII intact, strength normal in all extremities, gait normal, DTRs  symmetrical and normal, finger-to-nose normal, heel-to-shin normal, toes down bilaterally on Babinski, Romberg negative, and LUE sensory loss.   Skin:  over his left elbow he has a raised, purple dome that is slightly pedunculated.  Cervical Nodes:  no anterior cervical adenopathy and no posterior cervical adenopathy.   Axillary Nodes:  no R axillary adenopathy and no L axillary adenopathy.   Inguinal Nodes:  no R inguinal adenopathy and no L inguinal adenopathy.   Psych:  Cognition and judgment appear intact. Alert and cooperative with normal attention span and concentration. No apparent delusions, illusions, hallucinations  Diabetes Management Exam:    Foot Exam (with socks and/or shoes not present):       Sensory-Pinprick/Light touch:          Left medial foot (L-4): normal          Left dorsal foot (L-5): normal          Left lateral foot (S-1): normal          Right medial foot (L-4): normal          Right dorsal foot (L-5): normal          Right lateral foot (S-1): normal       Sensory-Monofilament:          Left foot: normal          Right foot: normal       Inspection:          Left foot: normal          Right foot: normal       Nails:          Left foot: normal          Right foot: normal   Impression & Recommendations:  Problem # 1:  HYPERTENSION (ICD-401.9) Assessment Improved  His updated medication list for this problem includes:    Atenolol 50 Mg Tabs (Atenolol) .Marland Kitchen... Take one by mouth two times a day    Altace 5 Mg Caps (Ramipril) ..... One by mouth once daily for blood pressure  BP today: 130/78 Prior BP: 140/80 (04/25/2009)  Prior 10 Yr Risk Heart Disease: Not enough information (04/25/2009)  Labs Reviewed: K+: 3.8 (04/12/2009) Creat: : 0.7 (04/12/2009)     Problem # 2:  MONONEURITIS OF UNSPECIFIED SITE (ICD-355.9) Assessment: New try lyrica for pain Orders: Neurology Referral (Neuro)  Problem # 3:  DIABETES MELLITUS, TYPE II  (ICD-250.00) Assessment: Unchanged  His updated medication list for this problem includes:    Janumet 50-500 Mg Tabs (Sitagliptin-metformin hcl) ..... One by mouth two times a day for diabetes    Altace 5 Mg Caps (Ramipril) ..... One by mouth once daily for blood pressure  Labs Reviewed: Creat: 0.7 (04/12/2009)     Last Eye Exam: normal (10/06/2008) Reviewed HgBA1c results: 7.2 (04/12/2009)  Problem # 4:  NEOPLASM, SKIN, UNCERTAIN BEHAVIOR (ICD-238.2) Assessment: Unchanged  Orders: Dermatology Referral (Derma)  Complete Medication List: 1)  Atenolol 50 Mg Tabs (Atenolol) .... Take one by mouth two times  a day 2)  Allegra 180 Mg Tabs (Fexofenadine hcl) .... One by mouth once daily for allergies 3)  Nuvigil 150 Mg Tabs (Armodafinil) .... Once daily for sleepiness 4)  Janumet 50-500 Mg Tabs (Sitagliptin-metformin hcl) .... One by mouth two times a day for diabetes 5)  Altace 5 Mg Caps (Ramipril) .... One by mouth once daily for blood pressure 6)  Cpap 13 Hcs  7)  Lyrica 50 Mg Caps (Pregabalin) .... One by mouth three times a day as needed for nerve pain  Patient Instructions: 1)  Please schedule a follow-up appointment in 2 months. 2)  It is important that you exercise regularly at least 20 minutes 5 times a week. If you develop chest pain, have severe difficulty breathing, or feel very tired , stop exercising immediately and seek medical attention. 3)  You need to lose weight. Consider a lower calorie diet and regular exercise.  4)  Check your blood sugars regularly. If your readings are usually above 200  or below 70 you should contact our office. 5)  It is important that your Diabetic A1c level is checked every 3 months. 6)  See your eye doctor yearly to check for diabetic eye damage. 7)  Check your feet each night for sore areas, calluses or signs of infection. 8)  Check your Blood Pressure regularly. If it is above 130/80: you should make an appointment. Prescriptions: LYRICA  50 MG CAPS (PREGABALIN) One by mouth three times a day as needed for nerve pain  #63 x 0   Entered and Authorized by:   [name]   Signed by:   [name] on 06/03/2009   Method used:   Samples Given   RxID:   [id number] JANUMET 50-500 MG TABS (SITAGLIPTIN-METFORMIN HCL) One by mouth two times a day for diabetes  #168 x 0   Entered and Authorized by:   [name]   Signed by:   [name] on 06/03/2009   Method used:   Samples Given   RxID:   [id number]

## 2010-03-28 NOTE — Letter (Signed)
Summary: CMN for CPAP Supplies/[facility]  CMN for CPAP Supplies/[facility]   Imported By: Sherian Rein 03/29/2009 07:44:48  _____________________________________________________________________  External Attachment:    Type:   Image     Comment:   External Document

## 2010-03-28 NOTE — Progress Notes (Signed)
Summary: [facility] pt  Phone Note Call from Patient Call back at [facility] Phone [phone]   Summary of Call: Pt has history of migraines. He had rx for imitrex from previous MD. Patient is requesting new rx for imitrex or maxalt. Please advise.   Initial call taken by: Lamar Sprinkles, CMA,  November 01, 2009 1:52 PM  Follow-up for Phone Call        [facility] Maxalt #12 and 1 ref Follow-up by: Tresa Garter MD,  November 01, 2009 3:00 PM  Additional Follow-up for Phone Call Additional follow up Details #1::        Pt informed  Additional Follow-up by: Lamar Sprinkles, CMA,  November 01, 2009 3:32 PM    New/Updated Medications: MAXALT-MLT 10 MG TBDP (RIZATRIPTAN BENZOATE) 1 by mouth once daily as needed migraine Prescriptions: MAXALT-MLT 10 MG TBDP (RIZATRIPTAN BENZOATE) 1 by mouth once daily as needed migraine  #12 x 1   Entered and Authorized by:   Tresa Garter MD   Signed by:   Tresa Garter MD on 11/01/2009   Method used:   Electronically to        [id number] (retail)       [address]       [place], [place]  [address]       Ph: [phone] or [phone]       Fax: [phone]   RxID:   [id number] MAXALT-MLT 10 MG TBDP (RIZATRIPTAN BENZOATE) 1 by mouth once daily as needed migraine  #12 x 12   Entered and Authorized by:   Tresa Garter MD   Signed by:   Tresa Garter MD on 11/01/2009   Method used:   Electronically to        [id number] (retail)       [address]       [place], [place]  [address]       Ph: [phone] or [phone]       Fax: [phone]   RxID:   [id number]

## 2010-03-28 NOTE — Assessment & Plan Note (Signed)
Summary: FU Robert [facility]ble FRIDAY/NWS  #   Vital Signs:  Patient profile:   35 year old male Height:      69 inches Weight:      272.25 pounds BMI:     40.35 O2 Sat:      94 % on Room air Temp:     97.4 degrees F oral Pulse rate:   65 / minute Pulse rhythm:   regular Resp:     16 per minute BP sitting:   140 / 82  (left arm) Cuff size:   large  Vitals Entered By: Rock Nephew CMA (October 07, 2009 8:36 AM)  Nutrition Counseling: Patient's BMI is greater than 25 and therefore counseled on weight management options.  O2 Flow:  Room air CC: follow-up visit Is Patient Diabetic? Yes Did you bring your meter with you today? No Pain Assessment Patient in pain? no       Does patient need assistance? Functional Status Self care Ambulation Normal   Primary Care Provider:  [name]  CC:  follow-up visit.  History of Present Illness: He returns for f/up and he says that he feels much better with less cough and no wheezing.  Preventive Screening-Counseling & Management  Alcohol-Tobacco     Alcohol drinks/day: 0     Smoking Status: never  Clinical Review Panels:  Diabetes Management   HgBA1C:  6.6 (09/28/2009)   Creatinine:  0.7 (09/28/2009)   Last Dilated Eye Exam:  normal (10/06/2008)   Last Foot Exam:  yes (10/07/2009)  CBC   WBC:  7.2 (09/28/2009)   RBC:  4.89 (09/28/2009)   Hgb:  14.6 (09/28/2009)   Hct:  42.4 (09/28/2009)   Platelets:  239.0 (09/28/2009)   MCV  86.7 (09/28/2009)   MCHC  34.5 (09/28/2009)   RDW  15.0 (09/28/2009)   PMN:  58.3 (09/28/2009)   Lymphs:  26.9 (09/28/2009)   Monos:  8.8 (09/28/2009)   Eosinophils:  5.5 (09/28/2009)   Basophil:  0.5 (09/28/2009)  Complete Metabolic Panel   Glucose:  90 (09/28/2009)   Sodium:  143 (09/28/2009)   Potassium:  4.2 (09/28/2009)   Chloride:  103 (09/28/2009)   CO2:  31 (09/28/2009)   BUN:  10 (09/28/2009)   Creatinine:  0.7 (09/28/2009)   Albumin:  4.5 (09/28/2009)   Total Protein:  8.0  (09/28/2009)   Calcium:  9.1 (09/28/2009)   Total Bili:  0.5 (09/28/2009)   Alk Phos:  49 (09/28/2009)   SGPT (ALT):  48 (09/28/2009)   SGOT (AST):  35 (09/28/2009)   Current Medications (verified): 1)  Atenolol 50 Mg Tabs (Atenolol) .... Take One By Mouth Two Times A Day 2)  Allegra 180 Mg Tabs (Fexofenadine Hcl) .... One By Mouth Once Daily For Allergies As Needed 3)  Janumet 50-500 Mg Tabs (Sitagliptin-Metformin Hcl) .... One By Mouth Two Times A Day For Diabetes 4)  Altace 5 Mg Caps (Ramipril) .... One By Mouth Once Daily For Blood Pressure 5)  Cpap 13 Hcs 6)  Neurontin 100 Mg Caps (Gabapentin) .... One By Mouth Three Times A Day As Needed 7)  Cymbalta 30 Mg Cpep (Duloxetine Hcl) .... One By Mouth Once Daily 8)  Freestyle Lite Test  Strp (Glucose Blood) .... Use Two Times A Day As Directed 9)  Avelox 400 Mg Tabs (Moxifloxacin Hcl) .... One By Mouth Once Daily For 7 Days 10)  Mytussin Ac 100-10 Mg/68ml Syrp (Guaifenesin-Codeine) .... 5-10 Ml By Mouth Qid As Needed For Cough 11)  Proair Hfa 108 (90 Base) Mcg/act Aers (Albuterol Sulfate) .Marland Kitchen.. 1-2 Puffs Qid As Needed For Wheezing  Allergies (verified): 1)  ! Pcn 2)  ! 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 Parents living Sister- OSA/ CPAP Lung Cancer, clotting disorder, emphysema, heart disease  Social History: Reviewed history from 03/04/2009 and no changes required. 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   Resp:  Complains of cough; denies chest discomfort, coughing up blood, pleuritic, shortness of breath, sputum productive, and wheezing.  Physical Exam  General:  alert, well-developed, well-nourished, well-hydrated, and overweight-appearing.   Mouth:  good dentition, no gingival abnormalities, pharynx pink and moist, no erythema, no exudates, no posterior lymphoid hypertrophy, no postnasal drip, no pharyngeal crowing, no lesions, no aphthous ulcers, no erosions, no tongue abnormalities, and no leukoplakia.   Neck:  supple, full ROM, no masses, no thyromegaly, no thyroid nodules or tenderness, no JVD, normal carotid upstroke, and no carotid bruits.   Lungs:  normal respiratory effort, no intercostal retractions, no accessory muscle use, normal breath sounds, no dullness, no fremitus, no crackles, and no wheezes.   Heart:  normal rate, regular rhythm, no murmur, no gallop, no rub, and no JVD.   Abdomen:  soft, non-tender, normal bowel sounds, no distention, no masses, no guarding, no rigidity, no rebound tenderness, no abdominal hernia, no inguinal hernia, no hepatomegaly, and no splenomegaly.   Msk:  normal ROM, no joint tenderness, no joint swelling, no joint warmth, no redness over joints, no joint deformities, no joint instability, and no crepitation.   Pulses:  R and L carotid,radial,femoral,dorsalis pedis and posterior tibial pulses are full and equal bilaterally Extremities:  No clubbing, cyanosis, edema, or deformity noted with  normal full range of motion of all joints.   Neurologic:  No cranial nerve deficits noted. Station and gait are normal. Plantar reflexes are down-going bilaterally. DTRs are symmetrical throughout. Sensory, motor and coordinative functions appear intact. Skin:  Intact without suspicious lesions or rashes Cervical Nodes:  No lymphadenopathy noted Psych:  Cognition and judgment appear intact. Alert and cooperative with normal attention span and concentration. No apparent delusions, illusions, hallucinations  Diabetes Management Exam:    Foot Exam (with socks and/or shoes not present):       Sensory-Pinprick/Light touch:          Left medial foot (L-4): normal          Left dorsal foot (L-5): normal          Left lateral foot (S-1): normal          Right medial foot (L-4): normal          Right dorsal foot (L-5): normal  Right lateral foot (S-1): normal       Sensory-Monofilament:          Left foot: normal          Right foot: normal       Inspection:          Left foot: normal          Right foot: normal       Nails:          Left foot: normal          Right foot: normal   Impression & Recommendations:  Problem # 1:  BRONCHITIS-ACUTE (ICD-466.0) Assessment Improved  His updated medication list for this problem includes:    Avelox 400 Mg Tabs (Moxifloxacin hcl) ..... One by mouth once daily for 7 days    Mytussin Ac 100-10 Mg/29ml Syrp (Guaifenesin-codeine) .Marland Kitchen... 5-10 ml by mouth qid as needed for cough    Proair Hfa 108 (90 Base) Mcg/act Aers (Albuterol sulfate) .Marland Kitchen... 1-2 puffs qid as needed for wheezing  Take antibiotics and other medications as directed. Encouraged to push clear liquids, get enough rest, and take acetaminophen as needed. To be seen in 5-7 days if no improvement, sooner if worse.  Problem # 2:  ASTHMA, UNSPECIFIED, UNSPECIFIED STATUS (ICD-493.90) Assessment: Improved  His updated medication list for this problem includes:    Proair Hfa 108 (90 Base)  Mcg/act Aers (Albuterol sulfate) .Marland Kitchen... 1-2 puffs qid as needed for wheezing  Pulmonary Functions Reviewed: O2 sat: 94 (10/07/2009)  Problem # 3:  COUGH (ICD-786.2) Assessment: Improved  Complete Medication List: 1)  Atenolol 50 Mg Tabs (Atenolol) .... Take one by mouth two times a day 2)  Allegra 180 Mg Tabs (Fexofenadine hcl) .... One by mouth once daily for allergies as needed 3)  Janumet 50-500 Mg Tabs (Sitagliptin-metformin hcl) .... One by mouth two times a day for diabetes 4)  Altace 5 Mg Caps (Ramipril) .... One by mouth once daily for blood pressure 5)  Cpap 13 Hcs  6)  Neurontin 100 Mg Caps (Gabapentin) .... One by mouth three times a day as needed 7)  Cymbalta 30 Mg Cpep (Duloxetine hcl) .... One by mouth once daily 8)  Freestyle Lite Test Strp (Glucose blood) .... Use two times a day as directed 9)  Avelox 400 Mg Tabs (Moxifloxacin hcl) .... One by mouth once daily for 7 days 10)  Mytussin Ac 100-10 Mg/84ml Syrp (Guaifenesin-codeine) .... 5-10 ml by mouth qid as needed for cough 11)  Proair Hfa 108 (90 Base) Mcg/act Aers (Albuterol sulfate) .Marland Kitchen.. 1-2 puffs qid as needed for wheezing  Patient Instructions: 1)  Please schedule a follow-up appointment in 2 months. 2)  It is important that you exercise regularly at least 20 minutes 5 times a week. If you develop chest pain, have severe difficulty breathing, or feel very tired , stop exercising immediately and seek medical attention. 3)  You need to lose weight. Consider a lower calorie diet and regular exercise.  4)  Check your blood sugars regularly. If your readings are usually above 200  or below 70 you should contact our office. 5)  It is important that your Diabetic A1c level is checked every 3 months. 6)  See your eye doctor yearly to check for diabetic eye damage. 7)  Check your feet each night for sore areas, calluses or signs of infection. 8)  Check your Blood Pressure regularly. If it is above 130/80: you should make an  appointment.

## 2010-03-28 NOTE — Assessment & Plan Note (Signed)
Summary: 2 MO ROV /NWS  #   Vital Signs:  Patient profile:   35 year old male Height:      69 inches Weight:      277 pounds BMI:     41.05 O2 Sat:      96 % on Room air Temp:     97.0 degrees F oral Pulse rate:   60 / minute Pulse rhythm:   regular Resp:     16 per minute BP sitting:   110 / 68  (left arm) Cuff size:   large  Vitals Entered By: Bill Salinas CMA (Jun 28, 2009 8:24 AM)  Nutrition Counseling: Patient's BMI is greater than 25 and therefore counseled on weight management options.  O2 Flow:  Room air CC: follow-up visit/ ab   Primary Care Provider:  [name]  CC:  follow-up visit/ ab.  History of Present Illness:  Follow-Up Visit      This is a 35 year old man who presents for Follow-up visit.  The patient denies chest pain, palpitations, dizziness, syncope, low blood sugar symptoms, high blood sugar symptoms, edema, SOB, DOE, PND, and orthopnea.  The patient reports taking meds as prescribed, monitoring BP, monitoring blood sugars, and dietary compliance.  When questioned about possible medication side effects, the patient notes none.    Preventive Screening-Counseling & Management  Alcohol-Tobacco     Alcohol drinks/day: 0     Smoking Status: never  Clinical Review Panels:  Diabetes Management   HgBA1C:  7.2 (04/12/2009)   Creatinine:  0.7 (04/12/2009)   Last Dilated Eye Exam:  normal (10/06/2008)   Last Foot Exam:  yes (06/28/2009)  CBC   WBC:  6.6 (04/12/2009)   RBC:  4.74 (04/12/2009)   Hgb:  14.0 (04/12/2009)   Hct:  41.1 (04/12/2009)   Platelets:  230.0 (04/12/2009)   MCV  86.8 (04/12/2009)   MCHC  34.1 (04/12/2009)   RDW  12.8 (04/12/2009)   PMN:  58.3 (04/12/2009)   Lymphs:  26.0 (04/12/2009)   Monos:  9.6 (04/12/2009)   Eosinophils:  5.2 (04/12/2009)   Basophil:  0.9 (04/12/2009)  Complete Metabolic Panel   Glucose:  194 (04/12/2009)   Sodium:  141 (04/12/2009)   Potassium:  3.8 (04/12/2009)   Chloride:  105  (04/12/2009)   CO2:  29 (04/12/2009)   BUN:  11 (04/12/2009)   Creatinine:  0.7 (04/12/2009)   Albumin:  4.0 (04/12/2009)   Total Protein:  7.6 (04/12/2009)   Calcium:  9.3 (04/12/2009)   Total Bili:  0.3 (04/12/2009)   Alk Phos:  58 (04/12/2009)   SGPT (ALT):  49 (04/12/2009)   SGOT (AST):  34 (04/12/2009)   Medications Prior to Update: 1)  Atenolol 50 Mg Tabs (Atenolol) .... Take One By Mouth Two Times A Day 2)  Allegra 180 Mg Tabs (Fexofenadine Hcl) .... One By Mouth Once Daily For Allergies 3)  Nuvigil 150 Mg Tabs (Armodafinil) .... Once Daily For Sleepiness 4)  Janumet 50-500 Mg Tabs (Sitagliptin-Metformin Hcl) .... One By Mouth Two Times A Day For Diabetes 5)  Altace 5 Mg Caps (Ramipril) .... One By Mouth Once Daily For Blood Pressure 6)  Cpap 13 Hcs 7)  Neurontin 100 Mg Caps (Gabapentin) .... One By Mouth Tid  Current Medications (verified): 1)  Atenolol 50 Mg Tabs (Atenolol) .... Take One By Mouth Two Times A Day 2)  Allegra 180 Mg Tabs (Fexofenadine Hcl) .... One By Mouth Once Daily For Allergies 3)  Nuvigil 150 Mg Tabs (Armodafinil) .... Once Daily For Sleepiness 4)  Janumet 50-500 Mg Tabs (Sitagliptin-Metformin Hcl) .... One By Mouth Two Times A Day For Diabetes 5)  Altace 5 Mg Caps (Ramipril) .... One By Mouth Once Daily For Blood Pressure 6)  Cpap 13 Hcs 7)  Neurontin 100 Mg Caps (Gabapentin) .... One By Mouth Tid  Allergies (verified): 1)  ! Pcn 2)  ! Lyrica (Pregabalin)  Past History:  Past Medical History: Reviewed history from 04/12/2009 and no changes required. Obstructive Sleep Apnea NPSG 01/28/09- AHI 37.8; CPAP 13- AHI 0/hr Allergic Rhinitis Chronic headache Diabetes mellitus, type II Hypertension Allergic rhinitis  Past Surgical History: Reviewed history from 03/04/2009 and no changes required. None  Family History: Reviewed history from 03/04/2009 and no changes required. Parents living Sister- OSA/ CPAP Lung Cancer, clotting disorder,  emphysema, heart disease  Social History: Reviewed history from 03/04/2009 and no changes required. 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    Physical Exam  General:  alert, well-developed, well-nourished, well-hydrated, and overweight-appearing.   Head:  normocephalic, atraumatic, no abnormalities observed, and no abnormalities palpated.   Mouth:  Oral mucosa and oropharynx without lesions or exudates.  Teeth in good repair. Neck:  supple, full ROM, no masses, no carotid bruits, and no neck tenderness.   Lungs:  Normal respiratory effort, chest expands symmetrically. Lungs are clear to auscultation, no crackles or wheezes. Heart:  Normal rate and regular rhythm. S1 and S2 normal without gallop, murmur, click, rub or other extra sounds. Abdomen:  soft, non-tender, normal bowel sounds, no distention, no masses, no guarding, no rigidity, no rebound tenderness, no hepatomegaly, and no splenomegaly.   Msk:  normal ROM, no joint tenderness, no joint swelling, no joint warmth, and no redness over joints.   Pulses:  R and L carotid,radial,femoral,dorsalis pedis and posterior tibial pulses are full and equal bilaterally Extremities:  No clubbing, cyanosis, edema, or deformity noted with normal full range of motion of all joints.   Neurologic:  LLE sensory loss over the left lateral thigh.  alert & oriented X3, cranial nerves II-XII intact, strength normal in all extremities, gait normal, DTRs symmetrical and normal, finger-to-nose normal, heel-to-shin normal, toes down bilaterally on Babinski, Romberg negative, and LUE sensory loss.   Skin:  turgor normal, color normal, no rashes, no suspicious lesions, no ecchymoses, no petechiae, no purpura, no ulcerations, and no  edema.   Cervical Nodes:  no anterior cervical adenopathy and no posterior cervical adenopathy.   Axillary Nodes:  no R axillary adenopathy and no L axillary adenopathy.   Psych:  Cognition and judgment appear intact. Alert and cooperative with normal attention span and concentration. No apparent delusions, illusions, hallucinations  Diabetes Management Exam:    Foot Exam (with socks and/or shoes not present):       Sensory-Pinprick/Light touch:          Left medial foot (L-4): normal          Left dorsal foot (L-5): normal          Left lateral foot (S-1): normal          Right medial foot (L-4): normal          Right dorsal foot (L-5): normal          Right  lateral foot (S-1): normal       Sensory-Monofilament:          Left foot: normal          Right foot: normal       Inspection:          Left foot: normal          Right foot: normal       Nails:          Left foot: normal          Right foot: normal   Impression & Recommendations:  Problem # 1:  MONONEURITIS OF UNSPECIFIED SITE (ICD-355.9) Assessment Improved he feels better on neurontin so will continue  Problem # 2:  HYPERTENSION (ICD-401.9) Assessment: Improved  His updated medication list for this problem includes:    Atenolol 50 Mg Tabs (Atenolol) .Marland Kitchen... Take one by mouth two times a day    Altace 5 Mg Caps (Ramipril) ..... One by mouth once daily for blood pressure  Orders: TLB-Lipid Panel (80061-LIPID) Venipuncture ([address]) TLB-BMP (Basic Metabolic Panel-BMET) (80048-METABOL) TLB-A1C / Hgb A1C (Glycohemoglobin) (83036-A1C)  BP today: 110/68 Prior BP: 130/78 (06/03/2009)  Prior 10 Yr Risk Heart Disease: Not enough information (04/25/2009)  Labs Reviewed: K+: 3.8 (04/12/2009) Creat: : 0.7 (04/12/2009)     Problem # 3:  DIABETES MELLITUS, TYPE II (ICD-250.00) Assessment: Improved  His updated medication list for this problem includes:    Janumet 50-500 Mg Tabs (Sitagliptin-metformin hcl) ..... One by  mouth two times a day for diabetes    Altace 5 Mg Caps (Ramipril) ..... One by mouth once daily for blood pressure  Orders: TLB-Lipid Panel (80061-LIPID) Venipuncture ([address]) TLB-BMP (Basic Metabolic Panel-BMET) (80048-METABOL) TLB-A1C / Hgb A1C (Glycohemoglobin) (83036-A1C)  Labs Reviewed: Creat: 0.7 (04/12/2009)     Last Eye Exam: normal (10/06/2008) Reviewed HgBA1c results: 7.2 (04/12/2009)  Complete Medication List: 1)  Atenolol 50 Mg Tabs (Atenolol) .... Take one by mouth two times a day 2)  Allegra 180 Mg Tabs (Fexofenadine hcl) .... One by mouth once daily for allergies 3)  Nuvigil 150 Mg Tabs (Armodafinil) .... Once daily for sleepiness 4)  Janumet 50-500 Mg Tabs (Sitagliptin-metformin hcl) .... One by mouth two times a day for diabetes 5)  Altace 5 Mg Caps (Ramipril) .... One by mouth once daily for blood pressure 6)  Cpap 13 Hcs  7)  Neurontin 100 Mg Caps (Gabapentin) .... One by mouth tid  Patient Instructions: 1)  Please schedule a follow-up appointment in 4 months. 2)  It is important that you exercise regularly at least 20 minutes 5 times a week. If you develop chest pain, have severe difficulty breathing, or feel very tired , stop exercising immediately and seek medical attention. 3)  You need to lose weight. Consider a lower calorie diet and regular exercise.  4)  Check your blood sugars regularly. If your readings are usually above 200  or below 70 you should contact our office. 5)  It is important that your Diabetic A1c level is checked every 3 months. 6)  See your eye doctor yearly to check for diabetic eye damage. 7)  Check your feet each night for sore areas, calluses or signs of infection.   Immunization History:  Tetanus/Td Immunization History:    Tetanus/Td:  td (03/29/2004)

## 2010-03-28 NOTE — Letter (Signed)
Summary: Lipid Letter  [facility]  [address] [place], [place] [address]   Phone: [phone]  Fax: [phone]    09/29/2009  Robert Gamble [address] [place], [place]  [address]  Dear Robert Gamble:  We have carefully reviewed your last lipid profile from  and the results are noted below with a summary of recommendations for lipid management.    Cholesterol:       170     Goal: <200   HDL "good" Cholesterol:   57.84     Goal: >40   LDL "bad" Cholesterol:   105     Goal: <130   Triglycerides:       275.0     Goal: <150   too high!        TLC Diet (Therapeutic Lifestyle Change): Saturated Fats & Transfatty acids should be kept < 7% of total calories ***Reduce Saturated Fats Polyunstaurated Fat can be up to 10% of total calories Monounsaturated Fat Fat can be up to 20% of total calories Total Fat should be no greater than 25-35% of total calories Carbohydrates should be 50-60% of total calories Protein should be approximately 15% of total calories Fiber should be at least 20-30 grams a day ***Increased fiber may help lower LDL Total Cholesterol should be < 200mg /day Consider adding plant stanol/sterols to diet (example: Benacol spread) ***A higher intake of unsaturated fat may reduce Triglycerides and Increase HDL    Adjunctive Measures (may lower LIPIDS and reduce risk of Heart Attack) include: Aerobic Exercise (20-30 minutes 3-4 times a week) Limit Alcohol Consumption Weight Reduction Aspirin 75-81 mg a day by mouth (if not allergic or contraindicated) Dietary Fiber 20-30 grams a day by mouth     Current Medications: 1)    Atenolol 50 Mg Tabs (Atenolol) .... Take one by mouth two times a day 2)    Allegra 180 Mg Tabs (Fexofenadine hcl) .... One by mouth once daily for allergies as needed 3)    Janumet 50-500 Mg Tabs (Sitagliptin-metformin hcl) .... One by mouth two times a day for diabetes 4)    Altace 5 Mg Caps (Ramipril) .... One by mouth  once daily for blood pressure 5)    Cpap 13 Hcs  6)    Neurontin 100 Mg Caps (Gabapentin) .... One by mouth three times a day as needed 7)    Cymbalta 30 Mg Cpep (Duloxetine hcl) .... One by mouth once daily 8)    Freestyle Lite Test  Strp (Glucose blood) .... Use two times a day as directed  If you have any questions, please call. We appreciate being able to work with you.   Sincerely,    [facility] [name]

## 2010-03-28 NOTE — Assessment & Plan Note (Signed)
Summary: cold-lb   Vital Signs:  Patient profile:   35 year old male Height:      69 inches Weight:      278 pounds BMI:     41.20 O2 Sat:      94 % on Room air Temp:     97.8 degrees F oral Pulse rate:   99 / minute Pulse rhythm:   regular Resp:     20 per minute BP sitting:   140 / 90  (left arm) Cuff size:   large  Vitals Entered By: Rock Nephew CMA (October 03, 2009 8:50 AM)  Nutrition Counseling: Patient's BMI is greater than 25 and therefore counseled on weight management options.  O2 Flow:  Room air CC: Pt c/o congestion, cough w/ yellow mucus, headache and Bilqateral Ear pain/ pressure x 09/30/09, URI symptoms Is Patient Diabetic? Yes Did you bring your meter with you today? No Pain Assessment Patient in pain? yes     Location: head   Primary Care Provider:  [name]  CC:  Pt c/o congestion, cough w/ yellow mucus, headache and Bilqateral Ear pain/ pressure x 09/30/09, and URI symptoms.  History of Present Illness:  URI Symptoms      This is a 35 year old man who presents with URI symptoms.  The symptoms began 3 days ago.  The severity is described as moderate.  The patient reports nasal congestion, purulent nasal discharge, sore throat, productive cough, and earache, but denies sick contacts.  Associated symptoms include low-grade fever (<100.5 degrees), dyspnea, and wheezing.  The patient denies stiff neck, rash, vomiting, diarrhea, use of an antipyretic, and response to antipyretic.  The patient also reports muscle aches and severe fatigue.  The patient denies sneezing, seasonal symptoms, and headache.  The patient denies the following risk factors for Strep sinusitis: unilateral facial pain, unilateral nasal discharge, poor response to decongestant, double sickening, and tooth pain.    Current Medications (verified): 1)  Atenolol 50 Mg Tabs (Atenolol) .... Take One By Mouth Two Times A Day 2)  Allegra 180 Mg Tabs (Fexofenadine Hcl) .... One By Mouth Once  Daily For Allergies As Needed 3)  Janumet 50-500 Mg Tabs (Sitagliptin-Metformin Hcl) .... One By Mouth Two Times A Day For Diabetes 4)  Altace 5 Mg Caps (Ramipril) .... One By Mouth Once Daily For Blood Pressure 5)  Cpap 13 Hcs 6)  Neurontin 100 Mg Caps (Gabapentin) .... One By Mouth Three Times A Day As Needed 7)  Cymbalta 30 Mg Cpep (Duloxetine Hcl) .... One By Mouth Once Daily 8)  Freestyle Lite Test  Strp (Glucose Blood) .... Use Two Times A Day As Directed  Allergies (verified): 1)  ! Pcn 2)  ! 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 Parents living Sister- OSA/ CPAP Lung Cancer, clotting disorder, emphysema, heart disease  Social History: Reviewed history from 03/04/2009 and  no changes required. 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  The patient denies anorexia, weight loss, chest pain, syncope, dyspnea on exertion, peripheral edema, headaches, hemoptysis, abdominal pain, hematuria, suspicious skin lesions, and enlarged lymph nodes.    Physical Exam  General:  alert, well-developed, well-nourished, well-hydrated, and  overweight-appearing.   Head:  normocephalic, atraumatic, no abnormalities observed, and no abnormalities palpated.   Eyes:  vision grossly intact, pupils equal, pupils round, and pupils reactive to light.  no icterus. Ears:  R ear normal and L ear normal.   Nose:  no external deformity, no nasal discharge, no nasal polyps, no nasal mucosal lesions, no mucosal friability, no active bleeding or clots, mucosal erythema, and mucosal edema.   Mouth:  good dentition, no gingival abnormalities, pharynx pink and moist, no erythema, no exudates, no posterior lymphoid hypertrophy, no postnasal drip, no pharyngeal crowing, no lesions, no aphthous ulcers, no erosions, no tongue abnormalities, and no leukoplakia.   Neck:  supple, full ROM, no masses, no thyromegaly, no thyroid nodules or tenderness, no JVD, normal carotid upstroke, and no carotid bruits.   Lungs:  normal respiratory effort, no intercostal retractions, no accessory muscle use, no dullness, no fremitus, no crackles, R wheezes, and L wheezes.   Heart:  normal rate, regular rhythm, no murmur, no gallop, no rub, no JVD, and no HJR.   Abdomen:  soft, non-tender, normal bowel sounds, no distention, no masses, no guarding, no rigidity, no rebound tenderness, no abdominal hernia, no inguinal hernia, no hepatomegaly, and no splenomegaly.   Msk:  normal ROM, no joint tenderness, no joint swelling, no joint warmth, no redness over joints, no joint deformities, no joint instability, and no crepitation.   Pulses:  R and L carotid,radial,femoral,dorsalis pedis and posterior tibial pulses are full and equal bilaterally Extremities:  No clubbing, cyanosis, edema, or deformity noted with normal full range of motion of all joints.   Neurologic:  No cranial nerve deficits noted. Station and gait are normal. Plantar reflexes are down-going bilaterally. DTRs are symmetrical throughout. Sensory, motor and coordinative functions appear intact. Skin:  Intact without  suspicious lesions or rashes Cervical Nodes:  No lymphadenopathy noted Axillary Nodes:  No palpable lymphadenopathy Inguinal Nodes:  No significant adenopathy Psych:  Cognition and judgment appear intact. Alert and cooperative with normal attention span and concentration. No apparent delusions, illusions, hallucinations Additional Exam:  he received a nebulized inhalation with xopenex 1.25 mg and his lungs are clear afterwards and his breathing is unlabored  Diabetes Management Exam:    Foot Exam (with socks and/or shoes not present):       Sensory-Pinprick/Light touch:          Left medial foot (L-4): normal          Left dorsal foot (L-5): normal          Left lateral foot (S-1): normal          Right medial foot (L-4): normal          Right dorsal foot (L-5): normal          Right lateral foot (S-1): normal       Sensory-Monofilament:          Left foot: normal          Right foot: normal       Inspection:  Left foot: normal          Right foot: normal       Nails:          Left foot: normal          Right foot: normal   Impression & Recommendations:  Problem # 1:  ASTHMA, UNSPECIFIED, UNSPECIFIED STATUS (ICD-493.90) Assessment New  given depo-medrol IM,  His updated medication list for this problem includes:    Proair Hfa 108 (90 Base) Mcg/act Aers (Albuterol sulfate) .Marland Kitchen... 1-2 puffs qid as needed for wheezing  Pulmonary Functions Reviewed: O2 sat: 94 (10/03/2009)  Orders: Nebulizer Tx ([address])  Problem # 2:  BRONCHITIS-ACUTE (ICD-466.0) Assessment: New  His updated medication list for this problem includes:    Avelox 400 Mg Tabs (Moxifloxacin hcl) ..... One by mouth once daily for 7 days    Mytussin Ac 100-10 Mg/63ml Syrp (Guaifenesin-codeine) .Marland Kitchen... 5-10 ml by mouth qid as needed for cough    Proair Hfa 108 (90 Base) Mcg/act Aers (Albuterol sulfate) .Marland Kitchen... 1-2 puffs qid as needed for wheezing  Take antibiotics and other medications as directed. Encouraged  to push clear liquids, get enough rest, and take acetaminophen as needed. To be seen in 5-7 days if no improvement, sooner if worse.  Problem # 3:  COUGH (ICD-786.2) Assessment: New  Orders: T-2 View CXR (71020TC)  Complete Medication List: 1)  Atenolol 50 Mg Tabs (Atenolol) .... Take one by mouth two times a day 2)  Allegra 180 Mg Tabs (Fexofenadine hcl) .... One by mouth once daily for allergies as needed 3)  Janumet 50-500 Mg Tabs (Sitagliptin-metformin hcl) .... One by mouth two times a day for diabetes 4)  Altace 5 Mg Caps (Ramipril) .... One by mouth once daily for blood pressure 5)  Cpap 13 Hcs  6)  Neurontin 100 Mg Caps (Gabapentin) .... One by mouth three times a day as needed 7)  Cymbalta 30 Mg Cpep (Duloxetine hcl) .... One by mouth once daily 8)  Freestyle Lite Test Strp (Glucose blood) .... Use two times a day as directed 9)  Avelox 400 Mg Tabs (Moxifloxacin hcl) .... One by mouth once daily for 7 days 10)  Mytussin Ac 100-10 Mg/33ml Syrp (Guaifenesin-codeine) .... 5-10 ml by mouth qid as needed for cough 11)  Proair Hfa 108 (90 Base) Mcg/act Aers (Albuterol sulfate) .Marland Kitchen.. 1-2 puffs qid as needed for wheezing  Patient Instructions: 1)  Please schedule a follow-up appointment in 2-3 days. 2)  Take your antibiotic as prescribed until ALL of it is gone, but stop if you develop a rash or swelling and contact our office as soon as possible. 3)  Acute bronchitis symptoms for less than 10 days are not helped by antibiotics. take over the counter cough medications. call if no improvment in  5-7 days, sooner if increasing cough, fever, or new symptoms( shortness of breath, chest pain). Prescriptions: PROAIR HFA 108 (90 BASE) MCG/ACT AERS (ALBUTEROL SULFATE) 1-2 puffs QID as needed for wheezing  #1 inh x 3   Entered and Authorized by:   [name]   Signed by:   [name] on 10/03/2009   Method used:   Electronically to        [facility] (retail)       [address]       [place], [place]  [address]       Ph: [phone] or [phone]       Fax:  [phone]   RxID:   [id number] MYTUSSIN AC 100-10 MG/5ML SYRP (GUAIFENESIN-CODEINE) 5-10 ml by mouth QID as needed for cough  #8 ounces x 0   Entered and Authorized by:   [name]   Signed by:   [name] on 10/03/2009   Method used:   Print then Give to Patient   RxID:   [id number] AVELOX 400 MG TABS (MOXIFLOXACIN HCL) One by mouth once daily for 7 days  #7 x 0   Entered and Authorized by:   [name]   Signed by:   [name] on 10/03/2009   Method used:   Samples Given   RxID:   [id number]

## 2010-03-28 NOTE — Assessment & Plan Note (Signed)
Summary: FU Robert Gamble   Vital Signs:  Patient profile:   35 year old male Height:      69 inches Weight:      272 pounds BMI:     40.31 O2 Sat:      96 % on Room air Temp:     98.0 degrees F oral Pulse rate:   76 / minute Pulse rhythm:   regular Resp:     16 per minute BP sitting:   132 / 82  (left arm) Cuff size:   large  Vitals Entered By: Rock Nephew CMA (September 28, 2009 8:13 AM)  Nutrition Counseling: Patient's BMI is greater than 25 and therefore counseled on weight management options.  O2 Flow:  Room air CC: Follow-up visit// requesting lab orders Is Patient Diabetic? No Pain Assessment Patient in pain? no        Primary Care Provider:  [name]  CC:  Follow-up visit// requesting lab orders.  History of Present Illness:  Follow-Up Visit      This is a 35 year old man who presents for Follow-up visit.  The patient denies chest pain, palpitations, dizziness, syncope, low blood sugar symptoms, high blood sugar symptoms, edema, SOB, DOE, PND, and orthopnea.  Since the last visit the patient notes no new problems or concerns and being seen by a specialist.  The patient reports taking meds as prescribed, monitoring BP, monitoring blood sugars, and dietary compliance.  When questioned about possible medication side effects, the patient notes none.    Preventive Screening-Counseling & Management  Alcohol-Tobacco     Alcohol drinks/day: 0     Smoking Status: never  Caffeine-Diet-Exercise     Does Patient Exercise: no     Exercise Counseling: to improve exercise regimen  Hep-HIV-STD-Contraception     Hepatitis Risk: no risk noted     HIV Risk: no risk noted     STD Risk: no risk noted     SBE monthly: yes      Sexual History:  currently monogamous.        Drug Use:  never.        Blood Transfusions:  no.    Clinical Review Panels:  Lipid Management   Cholesterol:  177 (06/28/2009)   HDL (good cholesterol):  28.70 (06/28/2009)  Diabetes  Management   HgBA1C:  6.5 (06/28/2009)   Creatinine:  0.7 (06/28/2009)   Last Dilated Eye Exam:  normal (10/06/2008)   Last Foot Exam:  yes (09/28/2009)  CBC   WBC:  6.6 (04/12/2009)   RBC:  4.74 (04/12/2009)   Hgb:  14.0 (04/12/2009)   Hct:  41.1 (04/12/2009)   Platelets:  230.0 (04/12/2009)   MCV  86.8 (04/12/2009)   MCHC  34.1 (04/12/2009)   RDW  12.8 (04/12/2009)   PMN:  58.3 (04/12/2009)   Lymphs:  26.0 (04/12/2009)   Monos:  9.6 (04/12/2009)   Eosinophils:  5.2 (04/12/2009)   Basophil:  0.9 (04/12/2009)  Complete Metabolic Panel   Glucose:  94 (06/28/2009)   Sodium:  142 (06/28/2009)   Potassium:  4.2 (06/28/2009)   Chloride:  103 (06/28/2009)   CO2:  31 (06/28/2009)   BUN:  6 (06/28/2009)   Creatinine:  0.7 (06/28/2009)   Albumin:  4.0 (04/12/2009)   Total Protein:  7.6 (04/12/2009)   Calcium:  9.7 (06/28/2009)   Total Bili:  0.3 (04/12/2009)   Alk Phos:  58 (04/12/2009)   SGPT (ALT):  49 (04/12/2009)   SGOT (AST):  34 (04/12/2009)   Medications Prior to Update: 1)  Atenolol 50 Mg Tabs (Atenolol) .... Take One By Mouth Two Times A Day 2)  Allegra 180 Mg Tabs (Fexofenadine Hcl) .... One By Mouth Once Daily For Allergies As Needed 3)  Janumet 50-500 Mg Tabs (Sitagliptin-Metformin Hcl) .... One By Mouth Two Times A Day For Diabetes 4)  Altace 5 Mg Caps (Ramipril) .... One By Mouth Once Daily For Blood Pressure 5)  Cpap 13 Hcs 6)  Neurontin 100 Mg Caps (Gabapentin) .... One By Mouth Tid 7)  Cymbalta 30 Mg Cpep (Duloxetine Hcl) .... One By Mouth Once Daily 8)  Freestyle Lite Test  Strp (Glucose Blood) .... Use Two Times A Day As Directed  Current Medications (verified): 1)  Atenolol 50 Mg Tabs (Atenolol) .... Take One By Mouth Two Times A Day 2)  Allegra 180 Mg Tabs (Fexofenadine Hcl) .... One By Mouth Once Daily For Allergies As Needed 3)  Janumet 50-500 Mg Tabs (Sitagliptin-Metformin Hcl) .... One By Mouth Two Times A Day For Diabetes 4)  Altace 5 Mg Caps  (Ramipril) .... One By Mouth Once Daily For Blood Pressure 5)  Cpap 13 Hcs 6)  Neurontin 100 Mg Caps (Gabapentin) .... One By Mouth Three Times A Day As Needed 7)  Cymbalta 30 Mg Cpep (Duloxetine Hcl) .... One By Mouth Once Daily 8)  Freestyle Lite Test  Strp (Glucose Blood) .... Use Two Times A Day As Directed  Allergies (verified): 1)  ! Pcn 2)  ! 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 Parents living Sister- OSA/ CPAP Lung Cancer, clotting disorder, emphysema, heart disease  Social History: Reviewed history from 03/04/2009 and no changes required. 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 1/2 ppd Quit ETOH, Street drugs Does Patient Exercise:  no  Review of Systems  The patient denies anorexia, fever, weight loss, weight gain, chest pain, syncope, abdominal pain, suspicious skin lesions, depression, angioedema, and prolonged cough.   Psych:  Denies anxiety, depression, easily angered, easily tearful, irritability, mental problems, panic attacks, sense of great danger, suicidal thoughts/plans, thoughts of violence, unusual visions or sounds, and thoughts /plans of harming others. Endo:  Denies cold intolerance, excessive  hunger, excessive thirst, excessive urination, heat intolerance, polyuria, and weight change.  Physical Exam  General:  alert, well-developed, well-nourished, well-hydrated, and overweight-appearing.   Head:  normocephalic, atraumatic, no abnormalities observed, and no abnormalities palpated.   Mouth:  Oral mucosa and oropharynx without lesions or exudates.  Teeth in good repair. Neck:  supple, full ROM, no masses, no carotid bruits, and no neck tenderness.   Lungs:  Normal respiratory effort, chest expands symmetrically. Lungs are clear to auscultation, no crackles or wheezes. Heart:  Normal rate and regular rhythm. S1 and S2 normal without gallop, murmur, click, rub or other extra sounds. Abdomen:  soft, non-tender, normal bowel sounds, no distention, no masses, no guarding,  no rigidity, no rebound tenderness, no hepatomegaly, and no splenomegaly.  RLQ tenderness.   Msk:  normal ROM, no joint tenderness, no joint swelling, no joint warmth, no redness over joints, no joint deformities, no joint instability, no crepitation, and no muscle atrophy.   Pulses:  R and L carotid,radial,femoral,dorsalis pedis and posterior tibial pulses are full and equal bilaterally Extremities:  No clubbing, cyanosis, edema, or deformity noted with normal full range of motion of all joints.   Neurologic:  alert & oriented X3, cranial nerves II-XII intact, strength normal in all extremities, sensation intact to light touch, gait normal, and DTRs symmetrical and normal.   Skin:  turgor normal, color normal, no rashes, no suspicious lesions, no ecchymoses, no petechiae, no purpura, no ulcerations, and no edema.   Cervical Nodes:  no anterior cervical adenopathy and no posterior cervical adenopathy.   Axillary Nodes:  no R axillary adenopathy and no L axillary adenopathy.   Inguinal Nodes:  no R inguinal adenopathy and no L inguinal adenopathy.   Psych:  Cognition and judgment appear intact. Alert and cooperative with  normal attention span and concentration. No apparent delusions, illusions, hallucinations  Diabetes Management Exam:    Foot Exam (with socks and/or shoes not present):       Sensory-Pinprick/Light touch:          Left medial foot (L-4): normal          Left dorsal foot (L-5): normal          Left lateral foot (S-1): normal          Right medial foot (L-4): normal          Right dorsal foot (L-5): normal          Right lateral foot (S-1): normal       Sensory-Monofilament:          Left foot: normal          Right foot: normal       Inspection:          Left foot: normal          Right foot: normal       Nails:          Left foot: normal          Right foot: normal   Impression & Recommendations:  Problem # 1:  DEPRESSIVE DISORDER (ICD-311) Assessment Improved  His updated medication list for this problem includes:    Cymbalta 30 Mg Cpep (Duloxetine hcl) ..... One by mouth once daily  Discussed treatment options, including trial of antidpressant medication. Will refer to behavioral health. Follow-up call in in 24-48 hours and recheck in 2 weeks, sooner as needed. Patient agrees to call if any worsening of symptoms or thoughts of doing harm arise. Verified that the patient has no suicidal ideation at this time.   Problem # 2:  HYPERTENSION (ICD-401.9) Assessment: Improved  His updated medication list for this problem includes:    Atenolol 50 Mg Tabs (Atenolol) .Marland Kitchen... Take one by mouth two times a day    Altace 5 Mg Caps (Ramipril) ..... One by mouth once daily for blood pressure  Orders: Venipuncture (11914) TLB-Lipid Panel (80061-LIPID) TLB-BMP (Basic Metabolic Panel-BMET) (80048-METABOL) TLB-CBC Platelet - w/Differential (85025-CBCD) TLB-Hepatic/Liver Function Pnl (80076-HEPATIC) TLB-TSH (Thyroid Stimulating Hormone) (84443-TSH) TLB-A1C / Hgb A1C (Glycohemoglobin) (83036-A1C) TLB-Udip w/ Micro (81001-URINE)  BP today: 132/82 Prior BP: 154/82 (08/16/2009)  Prior 10  Yr Risk Heart Disease: Not enough information (  04/25/2009)  Labs Reviewed: K+: 4.2 (06/28/2009) Creat: : 0.7 (06/28/2009)   Chol: 177 (06/28/2009)   HDL: 28.70 (06/28/2009)   TG: 324.0 (06/28/2009)  Problem # 3:  DIABETES MELLITUS, TYPE II (ICD-250.00) Assessment: Unchanged  His updated medication list for this problem includes:    Janumet 50-500 Mg Tabs (Sitagliptin-metformin hcl) ..... One by mouth two times a day for diabetes    Altace 5 Mg Caps (Ramipril) ..... One by mouth once daily for blood pressure  Orders: Venipuncture (04540) TLB-Lipid Panel (80061-LIPID) TLB-BMP (Basic Metabolic Panel-BMET) (80048-METABOL) TLB-CBC Platelet - w/Differential (85025-CBCD) TLB-Hepatic/Liver Function Pnl (80076-HEPATIC) TLB-TSH (Thyroid Stimulating Hormone) (84443-TSH) TLB-A1C / Hgb A1C (Glycohemoglobin) (83036-A1C) TLB-Udip w/ Micro (81001-URINE)  Problem # 4:  HYPERSOMNIA WITH SLEEP APNEA UNSPECIFIED (ICD-780.53) Assessment: Unchanged  Orders: Sleep Disorder Referral (Sleep Disorder)  Complete Medication List: 1)  Atenolol 50 Mg Tabs (Atenolol) .... Take one by mouth two times a day 2)  Allegra 180 Mg Tabs (Fexofenadine hcl) .... One by mouth once daily for allergies as needed 3)  Janumet 50-500 Mg Tabs (Sitagliptin-metformin hcl) .... One by mouth two times a day for diabetes 4)  Altace 5 Mg Caps (Ramipril) .... One by mouth once daily for blood pressure 5)  Cpap 13 Hcs  6)  Neurontin 100 Mg Caps (Gabapentin) .... One by mouth three times a day as needed 7)  Cymbalta 30 Mg Cpep (Duloxetine hcl) .... One by mouth once daily 8)  Freestyle Lite Test Strp (Glucose blood) .... Use two times a day as directed  Patient Instructions: 1)  Please schedule a follow-up appointment in 4 months. 2)  It is important that you exercise regularly at least 20 minutes 5 times a week. If you develop chest pain, have severe difficulty breathing, or feel very tired , stop exercising immediately and seek  medical attention. 3)  You need to lose weight. Consider a lower calorie diet and regular exercise.  4)  Check your blood sugars regularly. If your readings are usually above 200: or below 70 you should contact our office. 5)  It is important that your Diabetic A1c level is checked every 3 months. 6)  See your eye doctor yearly to check for diabetic eye damage. 7)  Check your feet each night for sore areas, calluses or signs of infection. 8)  Check your Blood Pressure regularly. If it is above 130/80: you should make an appointment. Prescriptions: CYMBALTA 30 MG CPEP (DULOXETINE HCL) One by mouth once daily  #30 x 11   Entered and Authorized by:   [name]   Signed by:   [name] on 09/28/2009   Method used:   Electronically to        [id number] (retail)       [address]       [place], [place]  [address]       Ph: [phone] or [phone]       Fax: [phone]   RxID:   [id number] JANUMET 50-500 MG TABS (SITAGLIPTIN-METFORMIN HCL) One by mouth two times a day for diabetes  #238 x 0   Entered and Authorized by:   [name]   Signed by:   [name] on 09/28/2009   Method used:   Samples Given   RxID:   [id number]

## 2010-03-28 NOTE — Letter (Signed)
Summary: Out of Work  [facility]  [address] [place], [place] [address]   Phone: [phone]  Fax: [phone]    October 03, 2009   Employee:  Robert Gamble    To Whom It May Concern:   For Medical reasons, please excuse the above named employee from work for the following dates:  Start:   10/03/09  End:   10/06/09  If you need additional information, please feel free to contact our office.         Sincerely,    [name]

## 2010-03-28 NOTE — Letter (Signed)
Summary: Lipid Letter  [facility]  [address] [place], [place] [address]   Phone: [phone]  Fax: [phone]    06/28/2009  Robert Gamble [address] [place], [place]  [address]  Dear Robert Gamble:  We have carefully reviewed your last lipid profile from  and the results are noted below with a summary of recommendations for lipid management.    Cholesterol:       177     Goal: <   HDL "good" Cholesterol:   16.60     Goal: >   LDL "bad" Cholesterol:   105     Goal: <   Triglycerides:       324.0     Goal: <    the triglycerides need to be lower and the HDL higher, your blood sugars look good.    TLC Diet (Therapeutic Lifestyle Change): Saturated Fats & Transfatty acids should be kept < 7% of total calories ***Reduce Saturated Fats Polyunstaurated Fat can be up to 10% of total calories Monounsaturated Fat Fat can be up to 20% of total calories Total Fat should be no greater than 25-35% of total calories Carbohydrates should be 50-60% of total calories Protein should be approximately 15% of total calories Fiber should be at least 20-30 grams a day ***Increased fiber may help lower LDL Total Cholesterol should be < 200mg /day Consider adding plant stanol/sterols to diet (example: Benacol spread) ***A higher intake of unsaturated fat may reduce Triglycerides and Increase HDL    Adjunctive Measures (may lower LIPIDS and reduce risk of Heart Attack) include: Aerobic Exercise (20-30 minutes 3-4 times a week) Limit Alcohol Consumption Weight Reduction Aspirin 75-81 mg a day by mouth (if not allergic or contraindicated) Dietary Fiber 20-30 grams a day by mouth     Current Medications: 1)    Atenolol 50 Mg Tabs (Atenolol) .... Take one by mouth two times a day 2)    Allegra 180 Mg Tabs (Fexofenadine hcl) .... One by mouth once daily for allergies 3)    Nuvigil 150 Mg Tabs (Armodafinil) .... Once daily for sleepiness 4)    Janumet 50-500 Mg Tabs  (Sitagliptin-metformin hcl) .... One by mouth two times a day for diabetes 5)    Altace 5 Mg Caps (Ramipril) .... One by mouth once daily for blood pressure 6)    Cpap 13 Hcs  7)    Neurontin 100 Mg Caps (Gabapentin) .... One by mouth tid  If you have any questions, please call. We appreciate being able to work with you.   Sincerely,    [facility] [name]

## 2010-03-28 NOTE — Progress Notes (Signed)
Summary: ALT MED  Phone Note Call from Patient Call back at Home Phone [phone]   Summary of Call: Pt stopped lyrica b/c he was intolerant of side effects. Pt has f/u next week but wants to know if doctor would prescribe alt med while waiting on apt.  Initial call taken by: Lamar Sprinkles, CMA,  June 21, 2009 11:38 AM  Follow-up for Phone Call        Pt informed  Follow-up by: Lamar Sprinkles, CMA,  June 21, 2009 1:40 PM   New Allergies: ! LYRICA (PREGABALIN) New/Updated Medications: NEURONTIN 100 MG CAPS (GABAPENTIN) One by mouth TID New Allergies: ! LYRICA (PREGABALIN)Prescriptions: NEURONTIN 100 MG CAPS (GABAPENTIN) One by mouth TID  #90 x 2   Entered and Authorized by:   [name]   Signed by:   [name] on 06/21/2009   Method used:   Electronically to        [id number] (retail)       [address]       [place], [place]  [address]       Ph: [phone] or [phone]       Fax: [phone]   RxID:   [id number]

## 2010-03-28 NOTE — Progress Notes (Signed)
Summary: nos appt  Phone Note Call from Patient   Caller: [email or web address]  Call For: young Summary of Call: In ref to nos from 9/14, pt states he will call to rsc. Initial call taken by: Darletta Moll,  November 10, 2009 9:50 AM

## 2010-03-28 NOTE — Letter (Signed)
Summary: [facility] Consult Scheduled Letter  [facility]  [address] [place], [place] [address]   Phone: [phone]  Fax: [phone]      04/25/2009 MRN: [id number]  K[place]ion [place] [address] [place], [place]  [address]    Dear Mr. BOLGER,      We have scheduled an appointment for you.  At the recommendation of Dr.Jones, we have scheduled you a consult with [facility] on March 17.2011 at 8:45 am.  Their phone number is [phone]. If this appointment day and time is not convenient for you, please feel free to call the office of the doctor you are being referred to at the number listed above and reschedule the appointment.  [facility]  [address] [place], [place]  [address] [phone]   Thank you,  Patient Care Coordinator [facility]
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
## 2010-03-28 NOTE — Assessment & Plan Note (Signed)
Summary: 2 WK ROV  / APPT AT 3:30 WITH DR Villa Herb   #   Vital Signs:  Patient profile:   35 year old male Height:      69 inches Weight:      284.38 pounds BMI:     42.15 O2 Sat:      97 % on Room air Temp:     98.1 degrees F oral Pulse rate:   75 / minute Pulse rhythm:   regular Resp:     16 per minute BP sitting:   140 / 88  (left arm)  Vitals Entered By: Lucious Groves (April 25, 2009 2:20 PM)  Nutrition Counseling: Patient's BMI is greater than 25 and therefore counseled on weight management options.  O2 Flow:  Room air CC: F/U--Pt states that he is doing fine, but has pain, numbness, and burning in Left thigh./kb, Hypertension Management Is Patient Diabetic? No Pain Assessment Patient in pain? no      Comments Per the patient the pain comes on/off and is sharp, 7 of 10. This began about 1 week ago./kb   Primary Care Provider:  [name]  CC:  F/U--Pt states that he is doing fine, but has pain, numbness, and burning in Left thigh./kb, and Hypertension Management.  History of Present Illness: He returns to start treatment for DM II.  Also, he reports a problem with his left lateral thigh for 10-12 years with intermittent episodes of sharp/brief pain with numbness and burning. There has not been a rash.  He is doing well on Nuvigil  with better energy, staying awake during the day, completing more tasks, and losing weight. He is sleeping better at night as well.  Hypertension History:      He denies headache, chest pain, palpitations, dyspnea with exertion, orthopnea, PND, peripheral edema, visual symptoms, neurologic problems, syncope, and side effects from treatment.  He notes no problems with any antihypertensive medication side effects.        Positive major cardiovascular risk factors include diabetes and hypertension.  Negative major cardiovascular risk factors include male age less than 34 years old, no history of hyperlipidemia, negative family history  for ischemic heart disease, and non-tobacco-user status.        Further assessment for target organ damage reveals no history of ASHD, cardiac end-organ damage (CHF/LVH), stroke/TIA, peripheral vascular disease, renal insufficiency, or hypertensive retinopathy.     Preventive Screening-Counseling & Management  Alcohol-Tobacco     Alcohol drinks/day: 0     Smoking Status: never  Hep-HIV-STD-Contraception     Hepatitis Risk: no risk noted     HIV Risk: no risk noted     STD Risk: no risk noted     SBE monthly: yes      Sexual History:  currently monogamous.        Drug Use:  never.        Blood Transfusions:  no.    Clinical Review Panels:  Diabetes Management   HgBA1C:  7.2 (04/12/2009)   Creatinine:  0.7 (04/12/2009)   Last Dilated Eye Exam:  normal (10/06/2008)   Last Foot Exam:  yes (04/25/2009)  CBC   WBC:  6.6 (04/12/2009)   RBC:  4.74 (04/12/2009)   Hgb:  14.0 (04/12/2009)   Hct:  41.1 (04/12/2009)   Platelets:  230.0 (04/12/2009)   MCV  86.8 (04/12/2009)   MCHC  34.1 (04/12/2009)   RDW  12.8 (04/12/2009)   PMN:  58.3 (04/12/2009)   Lymphs:  26.0 (04/12/2009)   Monos:  9.6 (04/12/2009)   Eosinophils:  5.2 (04/12/2009)   Basophil:  0.9 (04/12/2009)  Complete Metabolic Panel   Glucose:  194 (04/12/2009)   Sodium:  141 (04/12/2009)   Potassium:  3.8 (04/12/2009)   Chloride:  105 (04/12/2009)   CO2:  29 (04/12/2009)   BUN:  11 (04/12/2009)   Creatinine:  0.7 (04/12/2009)   Albumin:  4.0 (04/12/2009)   Total Protein:  7.6 (04/12/2009)   Calcium:  9.3 (04/12/2009)   Total Bili:  0.3 (04/12/2009)   Alk Phos:  58 (04/12/2009)   SGPT (ALT):  49 (04/12/2009)   SGOT (AST):  34 (04/12/2009)   Medications Prior to Update: 1)  Atenolol 50 Mg Tabs (Atenolol) .... Take 2 By Mouth Once Daily 2)  Allegra 180 Mg Tabs (Fexofenadine Hcl) .... One By Mouth Once Daily For Allergies 3)  Nuvigil 150 Mg Tabs (Armodafinil) .... Once Daily For Sleepiness  Current  Medications (verified): 1)  Atenolol 50 Mg Tabs (Atenolol) .... Take One By Mouth Bid 2)  Allegra 180 Mg Tabs (Fexofenadine Hcl) .... One By Mouth Once Daily For Allergies 3)  Nuvigil 150 Mg Tabs (Armodafinil) .... Once Daily For Sleepiness 4)  Janumet 50-500 Mg Tabs (Sitagliptin-Metformin Hcl) .... One By Mouth Two Times A Day For Diabetes 5)  Altace 5 Mg Caps (Ramipril) .... One By Mouth Once Daily For Blood Pressure  Allergies (verified): 1)  ! Pcn  Past History:  Past Medical History: Reviewed history from 04/12/2009 and no changes required. Obstructive Sleep Apnea NPSG 01/28/09- AHI 37.8; CPAP 13- AHI 0/hr Allergic Rhinitis Chronic headache Diabetes mellitus, type II Hypertension Allergic rhinitis  Past Surgical History: Reviewed history from 03/04/2009 and no changes required. None  Family History: Reviewed history from 03/04/2009 and no changes required. Parents living Sister- OSA/ CPAP Lung Cancer, clotting disorder, emphysema, heart disease  Social History: Reviewed history from 03/04/2009 and no changes required. 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 1/2 ppd Quit ETOH, Street drugs  Review of Systems  The patient denies anorexia, fever, weight loss, weight gain, chest pain, abdominal pain, hematuria, difficulty walking, and enlarged lymph nodes.   MS:  Denies joint pain, joint redness, joint swelling, loss of strength, low back pain, mid back pain, muscle, cramps, and muscle weakness. Neuro:  Complains of numbness; denies brief paralysis, disturbances in coordination, headaches, inability to speak, memory loss, poor balance, seizures, sensation of room spinning, tingling, tremors, and weakness. Psych:  Denies anxiety, depression, easily angered, easily tearful, irritability, mental problems, panic attacks, sense of great danger, suicidal thoughts/plans, thoughts of violence, unusual visions or sounds, and thoughts /plans of harming  others. Endo:  Denies cold intolerance, excessive hunger, excessive thirst, excessive urination, heat intolerance, polyuria, and weight change.  Physical Exam  General:  alert, well-developed, well-nourished, well-hydrated, and overweight-appearing.   Head:  normocephalic, atraumatic, no abnormalities observed, and no abnormalities palpated.   Eyes:  vision grossly intact, pupils equal, pupils round, and pupils reactive to light.   Ears:  R ear normal and L ear normal.   Neck:  supple, full ROM, no masses, no carotid bruits, and no neck tenderness.   Lungs:  Normal respiratory effort, chest expands symmetrically. Lungs are clear to auscultation, no crackles or wheezes. Heart:  Normal rate and regular rhythm. S1 and S2 normal without gallop, murmur, click, rub or other extra sounds. Abdomen:  soft, non-tender, normal bowel sounds, no distention, no masses, no guarding, no rigidity, no rebound  tenderness, no hepatomegaly, and no splenomegaly.   Msk:  normal ROM, no joint tenderness, no joint swelling, no joint warmth, and no redness over joints.   Pulses:  R and L carotid,radial,femoral,dorsalis pedis and posterior tibial pulses are full and equal bilaterally Extremities:  No clubbing, cyanosis, edema, or deformity noted with normal full range of motion of all joints.   Neurologic:  LLE sensory loss over the left lateral thigh.  alert & oriented X3, cranial nerves II-XII intact, strength normal in all extremities, gait normal, DTRs symmetrical and normal, finger-to-nose normal, heel-to-shin normal, toes down bilaterally on Babinski, Romberg negative, and LUE sensory loss.   Skin:  turgor normal, color normal, no rashes, no suspicious lesions, no ecchymoses, no petechiae, no purpura, no ulcerations, and no edema.   Cervical Nodes:  no anterior cervical adenopathy and no posterior cervical adenopathy.   Axillary Nodes:  no R axillary adenopathy and no L axillary adenopathy.   Inguinal Nodes:  no R  inguinal adenopathy and no L inguinal adenopathy.   Psych:  Cognition and judgment appear intact. Alert and cooperative with normal attention span and concentration. No apparent delusions, illusions, hallucinations  Diabetes Management Exam:    Foot Exam (with socks and/or shoes not present):       Sensory-Pinprick/Light touch:          Left medial foot (L-4): normal          Left dorsal foot (L-5): normal          Left lateral foot (S-1): normal          Right medial foot (L-4): normal          Right dorsal foot (L-5): normal          Right lateral foot (S-1): normal       Sensory-Monofilament:          Left foot: normal          Right foot: normal       Inspection:          Left foot: normal          Right foot: normal       Nails:          Left foot: normal          Right foot: normal   Impression & Recommendations:  Problem # 1:  PARESTHESIA (ICD-782.0) Assessment New will look for sensory neuropathy Orders: Neurology Referral (Neuro)  Problem # 2:  HYPERTENSION (ICD-401.9) Assessment: Deteriorated  His updated medication list for this problem includes:    Atenolol 50 Mg Tabs (Atenolol) .Marland Kitchen... Take one by mouth bid    Altace 5 Mg Caps (Ramipril) ..... One by mouth once daily for blood pressure  BP today: 140/88 Prior BP: 146/88 (04/12/2009)  10 Yr Risk Heart Disease: Not enough information  Labs Reviewed: K+: 3.8 (04/12/2009) Creat: : 0.7 (04/12/2009)     Problem # 3:  DIABETES MELLITUS, TYPE II (ICD-250.00) Assessment: Deteriorated  His updated medication list for this problem includes:    Janumet 50-500 Mg Tabs (Sitagliptin-metformin hcl) ..... One by mouth two times a day for diabetes    Altace 5 Mg Caps (Ramipril) ..... One by mouth once daily for blood pressure  Orders: Ophthalmology Referral (Ophthalmology)  Labs Reviewed: Creat: 0.7 (04/12/2009)     Last Eye Exam: normal (10/06/2008) Reviewed HgBA1c results: 7.2 (04/12/2009)  Problem # 4:   HYPERSOMNIA WITH SLEEP APNEA UNSPECIFIED (ICD-780.53) Assessment: Improved continue Nuvigil  Complete Medication List: 1)  Atenolol 50 Mg Tabs (Atenolol) .... Take one by mouth bid 2)  Allegra 180 Mg Tabs (Fexofenadine hcl) .... One by mouth once daily for allergies 3)  Nuvigil 150 Mg Tabs (Armodafinil) .... Once daily for sleepiness 4)  Janumet 50-500 Mg Tabs (Sitagliptin-metformin hcl) .... One by mouth two times a day for diabetes 5)  Altace 5 Mg Caps (Ramipril) .... One by mouth once daily for blood pressure 6)  Cpap 13 Hcs   Hypertension Assessment/Plan:      The patient's hypertensive risk group is category C: Target organ damage and/or diabetes.  Today's blood pressure is 140/88.  His blood pressure goal is < 130/80.  Patient Instructions: 1)  Please schedule a follow-up appointment in 2 months. 2)  It is important that you exercise regularly at least 20 minutes 5 times a week. If you develop chest pain, have severe difficulty breathing, or feel very tired , stop exercising immediately and seek medical attention. 3)  You need to lose weight. Consider a lower calorie diet and regular exercise.  4)  Check your blood sugars regularly. If your readings are usually above 200  or below 70 you should contact our office. 5)  It is important that your Diabetic A1c level is checked every 3 months. 6)  See your eye doctor yearly to check for diabetic eye damage. 7)  Check your feet each night for sore areas, calluses or signs of infection. 8)  Check your Blood Pressure regularly. If it is above 130/80: you should make an appointment. Prescriptions: NUVIGIL 150 MG TABS (ARMODAFINIL) once daily for sleepiness  #30 x 11   Entered and Authorized by:   [name]   Signed by:   [name] on 04/25/2009   Method used:   Print then Give to Patient   RxID:   [id number] ATENOLOL 50 MG TABS (ATENOLOL) take one by mouth bid  #60 x 11   Entered and Authorized by:   [name]   Signed by:   [name] on 04/25/2009   Method used:   Print then Give to Patient   RxID:   [id number] ALTACE 5 MG CAPS (RAMIPRIL) One by mouth once daily for blood pressure  #30 x 11   Entered and Authorized by:   [name]   Signed by:   [name] on 04/25/2009   Method used:   Print then Give to Patient   RxID:   [id number] JANUMET 50-500 MG TABS (SITAGLIPTIN-METFORMIN HCL) One by mouth two times a day for diabetes  #168 x 0   Entered and Authorized by:   [name]   Signed by:   [name] on 04/25/2009   Method used:   Samples Given   RxID:   [id number]
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
## 2010-03-30 NOTE — Letter (Signed)
Summary: Results Follow-up Letter  [facility]  [address] [place], [place] [address]   Phone: [phone]  Fax: [phone]    03/22/2010  [address] [place], [place]  [address]  Dear Robert Gamble,   The following are the results of your recent test(s):  Test       Result     Right hip xray     normal Left knee xray     normal Inflammation test     abnormal Blood sugars     too high CBC         slightly high eosinophils Liver/kidney     normal Urine         normal   _________________________________________________________  Please call for an appointment soon _________________________________________________________ _________________________________________________________ _________________________________________________________  Sincerely,  Sanda Linger MD [facility]

## 2010-03-30 NOTE — Progress Notes (Signed)
Summary: RESULTS & RX?  Phone Note Call from Patient   Summary of Call: Patient is requesting results and thought a pain med was going to be called into the pharmacy. I see letter - should pt be concerned about elevated eosinophils or inflammation tests?  Initial call taken by: Lamar Sprinkles, CMA,  March 23, 2010 3:01 PM  Follow-up for Phone Call        yes, we will need to f/up on his ct scan and labs Follow-up by: [name],  March 23, 2010 3:04 PM  Additional Follow-up for Phone Call Additional follow up Details #1::        What about pain med?  How soon does he need f/u? He needs another CT and more labs? Additional Follow-up by: Lamar Sprinkles, CMA,  March 23, 2010 3:20 PM    Additional Follow-up for Phone Call Additional follow up Details #2::    How soon does pt need f/u?..........................Marland KitchenLamar Sprinkles, CMA  March 23, 2010 5:50 PM  Follow-up by: [name],  March 24, 2010 7:45 AM  Additional Follow-up for Phone Call Additional follow up Details #3:: Details for Additional Follow-up Action Taken: return for a recheck in 2-3 weeks  Rx called in, Pt informed......................Marland KitchenLamar Sprinkles, CMA  March 24, 2010 11:59 AM  Additional Follow-up by: [name],  March 24, 2010 7:45 AM  Prescriptions: HYDROCODONE-ACETAMINOPHEN 5-325 MG TABS (HYDROCODONE-ACETAMINOPHEN) Take 1-2 every 4 to 6 hrs as needed for pain  #50 x 1   Entered by:   Lamar Sprinkles, CMA   Authorized by:   [name]   Signed by:   Lamar Sprinkles, CMA on 03/24/2010   Method used:   Telephoned to ...       [facility] (retail)       [address]       [place], [place]  [address]       Ph: [phone] or [phone]       Fax: [phone]   RxID:   [id number] HYDROCODONE-ACETAMINOPHEN 5-325 MG TABS (HYDROCODONE-ACETAMINOPHEN) Take 1-2 every 4 to 6 hrs as needed for pain  #50 x 1   Entered and Authorized by:   [name]   Signed  by:   [name] on 03/23/2010   Method used:   Historical   RxID:   [id number]

## 2010-04-11 ENCOUNTER — Ambulatory Visit: Payer: Self-pay | Admitting: Internal Medicine

## 2010-04-12 ENCOUNTER — Ambulatory Visit (HOSPITAL_COMMUNITY)
Admission: EM | Admit: 2010-04-12 | Discharge: 2010-04-13 | Disposition: A | Discharge status: Home / Self Care | Payer: BC Managed Care – PPO | Attending: Surgery | Admitting: Surgery

## 2010-04-12 ENCOUNTER — Ambulatory Visit (INDEPENDENT_AMBULATORY_CARE_PROVIDER_SITE_OTHER): Payer: BC Managed Care – PPO | Admitting: Internal Medicine

## 2010-04-12 ENCOUNTER — Ambulatory Visit (HOSPITAL_COMMUNITY)
Admission: RE | Admit: 2010-04-12 | Discharge: 2010-04-12 | Disposition: A | Discharge status: Home / Self Care | Payer: BC Managed Care – PPO | Source: Ambulatory Visit | Attending: Internal Medicine | Admitting: Internal Medicine

## 2010-04-12 ENCOUNTER — Encounter (INDEPENDENT_AMBULATORY_CARE_PROVIDER_SITE_OTHER): Payer: Self-pay | Admitting: *Deleted

## 2010-04-12 ENCOUNTER — Other Ambulatory Visit: Payer: Self-pay | Admitting: Internal Medicine

## 2010-04-12 ENCOUNTER — Other Ambulatory Visit: Payer: BC Managed Care – PPO

## 2010-04-12 ENCOUNTER — Encounter: Payer: Self-pay | Admitting: Internal Medicine

## 2010-04-12 DIAGNOSIS — R1031 Right lower quadrant pain: Secondary | ICD-10-CM

## 2010-04-12 DIAGNOSIS — M51379 Other intervertebral disc degeneration, lumbosacral region without mention of lumbar back pain or lower extremity pain: Secondary | ICD-10-CM | POA: Insufficient documentation

## 2010-04-12 DIAGNOSIS — E119 Type 2 diabetes mellitus without complications: Secondary | ICD-10-CM | POA: Insufficient documentation

## 2010-04-12 DIAGNOSIS — R599 Enlarged lymph nodes, unspecified: Secondary | ICD-10-CM | POA: Insufficient documentation

## 2010-04-12 DIAGNOSIS — K3580 Unspecified acute appendicitis: Secondary | ICD-10-CM | POA: Insufficient documentation

## 2010-04-12 DIAGNOSIS — G43909 Migraine, unspecified, not intractable, without status migrainosus: Secondary | ICD-10-CM | POA: Insufficient documentation

## 2010-04-12 DIAGNOSIS — R1933 Right lower quadrant abdominal rigidity: Secondary | ICD-10-CM

## 2010-04-12 DIAGNOSIS — J8410 Pulmonary fibrosis, unspecified: Secondary | ICD-10-CM | POA: Insufficient documentation

## 2010-04-12 DIAGNOSIS — I10 Essential (primary) hypertension: Secondary | ICD-10-CM | POA: Insufficient documentation

## 2010-04-12 DIAGNOSIS — G4733 Obstructive sleep apnea (adult) (pediatric): Secondary | ICD-10-CM | POA: Insufficient documentation

## 2010-04-12 DIAGNOSIS — Z01812 Encounter for preprocedural laboratory examination: Secondary | ICD-10-CM | POA: Insufficient documentation

## 2010-04-12 DIAGNOSIS — M5137 Other intervertebral disc degeneration, lumbosacral region: Secondary | ICD-10-CM | POA: Insufficient documentation

## 2010-04-12 DIAGNOSIS — F17200 Nicotine dependence, unspecified, uncomplicated: Secondary | ICD-10-CM | POA: Insufficient documentation

## 2010-04-12 DIAGNOSIS — Z79899 Other long term (current) drug therapy: Secondary | ICD-10-CM | POA: Insufficient documentation

## 2010-04-12 LAB — BASIC METABOLIC PANEL
BUN: 14 mg/dL (ref 6–23)
Calcium: 9.4 mg/dL (ref 8.4–10.5)
Creatinine, Ser: 0.8 mg/dL (ref 0.4–1.5)
GFR calc Af Amer: >60 mL/min (ref >60)
GFR calc non Af Amer: >60 mL/min (ref >60)
GFR: 125.03 mL/min (ref >60.00)
Glucose, Bld: 147 mg/dL — ABNORMAL HIGH (ref 70–99)
Sodium: 140 mEq/L (ref 135–145)

## 2010-04-12 LAB — DIFFERENTIAL
Basophils Absolute: 0.1 10*3/uL (ref 0.0–0.1)
Basophils Relative: 1 % (ref 0–1)
Eosinophils Absolute: 0.4 10*3/uL (ref 0.0–0.7)
Monocytes Absolute: 0.9 10*3/uL (ref 0.1–1.0)
Neutro Abs: 3.8 10*3/uL (ref 1.7–7.7)

## 2010-04-12 LAB — HEPATIC FUNCTION PANEL
ALT: 42 U/L (ref 0–53)
Bilirubin, Direct: 0.1 mg/dL (ref 0.0–0.3)
Total Bilirubin: 0.5 mg/dL (ref 0.3–1.2)

## 2010-04-12 LAB — CBC
Hemoglobin: 13.3 g/dL (ref 13.0–17.0)
MCHC: 34.4 g/dL (ref 30.0–36.0)
Platelets: 229 10*3/uL (ref 150–400)
RDW: 13.4 % (ref 11.5–15.5)

## 2010-04-12 LAB — LIPASE: Lipase: 26 U/L (ref 11.0–59.0)

## 2010-04-12 LAB — AMYLASE: Amylase: 29 U/L (ref 27–131)

## 2010-04-13 ENCOUNTER — Other Ambulatory Visit: Payer: Self-pay | Admitting: General Surgery

## 2010-04-13 LAB — URINALYSIS, ROUTINE W REFLEX MICROSCOPIC
Nitrite: NEGATIVE
Protein, ur: NEGATIVE mg/dL
Specific Gravity, Urine: 1.015 (ref 1.005–1.030)
Urobilinogen, UA: 0.2 mg/dL (ref 0.0–1.0)

## 2010-04-13 LAB — CBC WITH DIFFERENTIAL/PLATELET
Basophils Absolute: 0 10*3/uL (ref 0.0–0.1)
Eosinophils Relative: 5.1 % — ABNORMAL HIGH (ref 0.0–5.0)
Lymphs Abs: 2.6 10*3/uL (ref 0.7–4.0)
MCV: 85 fl (ref 78.0–100.0)
Monocytes Absolute: 1 10*3/uL (ref 0.1–1.0)
Monocytes Relative: 11.4 % (ref 3.0–12.0)
Neutrophils Relative %: 53.6 % (ref 43.0–77.0)
Platelets: 271 10*3/uL (ref 150.0–400.0)
RDW: 13.7 % (ref 11.5–14.6)
WBC: 8.8 10*3/uL (ref 4.5–10.5)

## 2010-04-13 LAB — GLUCOSE, CAPILLARY: Glucose-Capillary: 155 mg/dL — ABNORMAL HIGH (ref 70–99)

## 2010-04-13 LAB — URINE MICROSCOPIC-ADD ON: Urine-Other: NONE SEEN
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
## 2010-04-19 NOTE — Assessment & Plan Note (Signed)
Summary: PER Robert Gamble  2-3 WK FU PER PT D/T  STC   Vital Signs:  Patient profile:   35 year old male Height:      69 inches Weight:      273 pounds BMI:     40.46 O2 Sat:      98 % on Room air Temp:     97.5 degrees F oral Pulse rate:   80 / minute Pulse rhythm:   regular Resp:     16 per minute BP sitting:   150 / 86  (left arm) Cuff size:   large  Vitals Entered By: Rock Nephew CMA (April 12, 2010 3:27 PM)  Nutrition Counseling: Patient's BMI is greater than 25 and therefore counseled on weight management options.  O2 Flow:  Room air  Primary Care Provider:  [name]  CC:  Abdominal pain.  History of Present Illness:  Abdominal Pain      This is a 35 year old man who presents with Abdominal pain.  The symptoms began 2 days ago.  On a scale of 1 to 10, the intensity is described as a 6.  The patient reports diarrhea, but denies nausea, vomiting, constipation, melena, hematochezia, anorexia, and hematemesis.  The location of the pain is right lower quadrant.  The pain is described as constant and dull.  The patient denies the following symptoms: fever, weight loss, dysuria, chest pain, jaundice, and dark urine.  The pain is worse with jarring of the abdomen.    Preventive Screening-Counseling & Management  Alcohol-Tobacco     Alcohol drinks/day: 0     Alcohol Counseling: not indicated; patient does not drink     Smoking Status: never     Tobacco Counseling: not indicated; no tobacco use  Hep-HIV-STD-Contraception     Hepatitis Risk: no risk noted     HIV Risk: no risk noted     STD Risk: no risk noted     SBE monthly: yes      Sexual History:  currently monogamous.        Drug Use:  never.        Blood Transfusions:  no.    Clinical Review Panels:  Immunizations   Last Tetanus Booster:  Td (03/29/2004)  Lipid Management   Cholesterol:  170 (09/28/2009)   HDL (good cholesterol):  27.90 (09/28/2009)  Diabetes Management   HgBA1C:  8.3  (03/22/2010)   Creatinine:  0.8 (03/22/2010)   Last Dilated Eye Exam:  normal (10/06/2008)   Last Foot Exam:  yes (04/12/2010)  CBC   WBC:  9.0 (03/22/2010)   RBC:  4.81 (03/22/2010)   Hgb:  14.5 (03/22/2010)   Hct:  40.8 (03/22/2010)   Platelets:  278.0 (03/22/2010)   MCV  84.8 (03/22/2010)   MCHC  35.5 (03/22/2010)   RDW  13.3 (03/22/2010)   PMN:  62.1 (03/22/2010)   Lymphs:  22.7 (03/22/2010)   Monos:  9.6 (03/22/2010)   Eosinophils:  5.1 (03/22/2010)   Basophil:  0.5 (03/22/2010)  Complete Metabolic Panel   Glucose:  188 (03/22/2010)   Sodium:  140 (03/22/2010)   Potassium:  4.2 (03/22/2010)   Chloride:  103 (03/22/2010)   CO2:  29 (03/22/2010)   BUN:  14 (03/22/2010)   Creatinine:  0.8 (03/22/2010)   Albumin:  4.2 (03/22/2010)   Total Protein:  7.8 (03/22/2010)   Calcium:  9.6 (03/22/2010)   Total Bili:  0.4 (03/22/2010)   Alk Phos:  67 (03/22/2010)  SGPT (ALT):  46 (03/22/2010)   SGOT (AST):  34 (03/22/2010)   Medications Prior to Update: 1)  Atenolol 50 Mg Tabs (Atenolol) .... Take One By Mouth Two Times A Day 2)  Allegra 180 Mg Tabs (Fexofenadine Hcl) .... One By Mouth Once Daily For Allergies As Needed 3)  Janumet 50-500 Mg Tabs (Sitagliptin-Metformin Hcl) .... One By Mouth Two Times A Day For Diabetes 4)  Cpap 13 Hcs 5)  Neurontin 100 Mg Caps (Gabapentin) .... One By Mouth Three Times A Day As Needed 6)  Freestyle Lite Test  Strp (Glucose Blood) .... Use Two Times A Day As Directed 7)  Maxalt-Mlt 10 Mg Tbdp (Rizatriptan Benzoate) .Marland Kitchen.. 1 By Mouth Once Daily As Needed Migraine 8)  Hydrocodone-Acetaminophen 5-325 Mg Tabs (Hydrocodone-Acetaminophen) .... Take 1-2 Every 4 To 6 Hrs As Needed For Pain 9)  Ibuprofen .... As Needed 10)  Diovan 320 Mg Tabs (Valsartan) .... One By Mouth Once Daily For High Blood Pressure  Current Medications (verified): 1)  Atenolol 50 Mg Tabs (Atenolol) .... Take One By Mouth Two Times A Day 2)  Allegra 180 Mg Tabs (Fexofenadine  Hcl) .... One By Mouth Once Daily For Allergies As Needed 3)  Janumet 50-500 Mg Tabs (Sitagliptin-Metformin Hcl) .... One By Mouth Two Times A Day For Diabetes 4)  Cpap 13 Hcs 5)  Neurontin 100 Mg Caps (Gabapentin) .... One By Mouth Three Times A Day As Needed 6)  Freestyle Lite Test  Strp (Glucose Blood) .... Use Two Times A Day As Directed 7)  Maxalt-Mlt 10 Mg Tbdp (Rizatriptan Benzoate) .Marland Kitchen.. 1 By Mouth Once Daily As Needed Migraine 8)  Hydrocodone-Acetaminophen 5-325 Mg Tabs (Hydrocodone-Acetaminophen) .... Take 1-2 Every 4 To 6 Hrs As Needed For Pain 9)  Ibuprofen .... As Needed 10)  Diovan 320 Mg Tabs (Valsartan) .... One By Mouth Once Daily For High Blood Pressure  Allergies (verified): 1)  ! Pcn 2)  ! Lyrica (Pregabalin) 3)  ! 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 1/2 ppd Quit ETOH, Street drugs  Risk Factors: Alcohol Use: 0 (04/12/2010) Exercise: no (09/28/2009)  Risk Factors: Smoking Status: never (04/12/2010)  Family History: Reviewed history from 03/04/2009 and no changes required. Parents living Sister- OSA/ CPAP Lung Cancer, clotting disorder, emphysema, heart disease  Social History: Reviewed history from 03/04/2009 and no changes required. 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 1/2 ppd Quit ETOH, Street drugs  Review of Systems       The patient complains of abdominal pain.  The patient denies anorexia, fever, weight loss, weight  gain, chest pain, peripheral edema, prolonged cough, headaches, hemoptysis, hematuria, suspicious skin lesions, difficulty walking, enlarged lymph nodes, and angioedema.   General:  Complains of chills; denies fatigue, fever, loss of appetite, malaise, and sweats. GI:  Complains of abdominal pain, change in bowel habits, and diarrhea; denies bloody stools, constipation, dark tarry stools, gas, indigestion, loss of appetite, nausea, vomiting, vomiting blood, and yellowish skin color.  Physical Exam  General:  alert, well-developed, well-nourished, well-hydrated, appropriate dress, normal appearance, healthy-appearing, cooperative to examination, good hygiene, and  overweight-appearing.   Head:  normocephalic, atraumatic, and no abnormalities observed.   Mouth:  Oral mucosa and oropharynx without lesions or exudates.  Teeth in good repair. Neck:  supple, full ROM, no masses, no thyromegaly, no thyroid nodules or tenderness, no JVD, normal carotid upstroke, no carotid bruits, no cervical lymphadenopathy, and no neck tenderness.   Lungs:  normal respiratory effort, no intercostal retractions, no accessory muscle use, normal breath sounds, no dullness, no fremitus, no crackles, and no wheezes.   Heart:  normal rate, regular rhythm, no murmur, no gallop, no rub, and no JVD.   Abdomen:   RLQ soft, normal bowel sounds, no distention, no masses, no rigidity, no abdominal hernia, guarding, rebound tenderness, and RLQ tenderness.   Rectal:  No external abnormalities noted. Normal sphincter tone. No rectal masses or tenderness. Genitalia:  circumcised, no hydrocele, no varicocele, no scrotal masses, no testicular masses or atrophy, no cutaneous lesions, and no urethral discharge.   Msk:  normal ROM, no joint swelling, no joint warmth, no redness over joints, no joint deformities, no joint instability, no crepitation, and no muscle atrophy.  there is extensive ecchymosis over the right hip with mild ttp but no  boney derangements. Extremities:  No clubbing, cyanosis, edema, or deformity noted with normal full range of motion of all joints.   Neurologic:  No cranial nerve deficits noted. Station and gait are normal. Plantar reflexes are down-going bilaterally. DTRs are symmetrical throughout. Sensory, motor and coordinative functions appear intact. Skin:  turgor normal, color normal, no rashes, no suspicious lesions, no petechiae, no purpura, no ulcerations, and no edema.   Cervical Nodes:  no anterior cervical adenopathy and no posterior cervical adenopathy.   Psych:  Cognition and judgment appear intact. Alert and cooperative with normal attention span and concentration. No apparent delusions, illusions, hallucinations  Diabetes Management Exam:    Foot Exam (with socks and/or shoes not present):       Sensory-Pinprick/Light touch:          Left medial foot (L-4): normal          Left dorsal foot (L-5): normal          Left lateral foot (S-1): normal          Right medial foot (L-4): normal          Right dorsal foot (L-5): normal          Right lateral foot (S-1): normal       Sensory-Monofilament:          Left foot: normal          Right foot: normal       Inspection:          Left foot: normal          Right foot: normal       Nails:          Left foot: normal          Right foot: normal   Impression & Recommendations:  Problem # 1:  ABDOMINAL RIGIDITY RIGHT LOWER QUADRANT (ZOX-096.04) Assessment New will check labs and scan his abd for appendicitis Orders: Radiology Referral (Radiology) Venipuncture (54098) TLB-BMP (Basic Metabolic Panel-BMET) (80048-METABOL) TLB-CBC Platelet - w/Differential (85025-CBCD) TLB-Hepatic/Liver Function Pnl (80076-HEPATIC) TLB-Amylase (82150-AMYL) TLB-Lipase (83690-LIPASE)  Complete Medication List: 1)  Atenolol 50 Mg Tabs (Atenolol) .... Take one by mouth two times a day 2)  Allegra 180 Mg Tabs (Fexofenadine hcl) .... One by mouth once daily for  allergies as needed 3)  Janumet 50-500 Mg Tabs (Sitagliptin-metformin hcl) .... One by mouth two times a day for diabetes 4)  Cpap 13 Hcs  5)  Neurontin 100 Mg Caps (Gabapentin) .... One by mouth three times a day as needed 6)  Freestyle Lite Test Strp (Glucose blood) .... Use two times a day as directed 7)  Maxalt-mlt 10 Mg Tbdp (Rizatriptan benzoate) .Marland Kitchen.. 1 by mouth once daily as needed migraine 8)  Hydrocodone-acetaminophen 5-325 Mg Tabs (Hydrocodone-acetaminophen) .... Take 1-2 every 4 to 6 hrs as needed for pain 9)  Ibuprofen  .... As needed 10)  Diovan 320 Mg Tabs (Valsartan) .... One by mouth once daily for high blood pressure   Orders Added: 1)  Radiology Referral [Radiology] 2)  Venipuncture [16109] 3)  TLB-BMP (Basic Metabolic Panel-BMET) [80048-METABOL] 4)  TLB-CBC Platelet - w/Differential [85025-CBCD] 5)  TLB-Hepatic/Liver Function Pnl [80076-HEPATIC] 6)  TLB-Amylase [82150-AMYL] 7)  TLB-Lipase [83690-LIPASE] 8)  Est. Patient Level IV [60454]

## 2010-05-09 ENCOUNTER — Encounter (INDEPENDENT_AMBULATORY_CARE_PROVIDER_SITE_OTHER): Payer: Self-pay | Admitting: *Deleted

## 2010-05-09 ENCOUNTER — Other Ambulatory Visit: Payer: BC Managed Care – PPO

## 2010-05-09 ENCOUNTER — Ambulatory Visit (INDEPENDENT_AMBULATORY_CARE_PROVIDER_SITE_OTHER): Payer: BC Managed Care – PPO | Admitting: Internal Medicine

## 2010-05-09 ENCOUNTER — Other Ambulatory Visit: Payer: Self-pay | Admitting: Internal Medicine

## 2010-05-09 ENCOUNTER — Encounter: Payer: Self-pay | Admitting: Internal Medicine

## 2010-05-09 DIAGNOSIS — J45909 Unspecified asthma, uncomplicated: Secondary | ICD-10-CM

## 2010-05-09 DIAGNOSIS — I10 Essential (primary) hypertension: Secondary | ICD-10-CM

## 2010-05-09 DIAGNOSIS — E119 Type 2 diabetes mellitus without complications: Secondary | ICD-10-CM

## 2010-05-09 DIAGNOSIS — J309 Allergic rhinitis, unspecified: Secondary | ICD-10-CM

## 2010-05-09 DIAGNOSIS — I880 Nonspecific mesenteric lymphadenitis: Secondary | ICD-10-CM

## 2010-05-09 DIAGNOSIS — E669 Obesity, unspecified: Secondary | ICD-10-CM

## 2010-05-09 LAB — CBC WITH DIFFERENTIAL/PLATELET
Basophils Absolute: 0 10*3/uL (ref 0.0–0.1)
Basophils Relative: 0.6 % (ref 0.0–3.0)
Eosinophils Absolute: 0.4 10*3/uL (ref 0.0–0.7)
Lymphocytes Relative: 19.9 % (ref 12.0–46.0)
MCHC: 33.9 g/dL (ref 30.0–36.0)
MCV: 85.3 fl (ref 78.0–100.0)
Monocytes Absolute: 0.6 10*3/uL (ref 0.1–1.0)
Neutrophils Relative %: 65.4 % (ref 43.0–77.0)
Platelets: 272 10*3/uL (ref 150.0–400.0)
RDW: 13.7 % (ref 11.5–14.6)

## 2010-05-09 LAB — LIPID PANEL
Cholesterol: 171 mg/dL (ref 0–200)
HDL: 33.9 mg/dL — ABNORMAL LOW (ref >39.00)
Total CHOL/HDL Ratio: 5
Triglycerides: 174 mg/dL — ABNORMAL HIGH (ref 0.0–149.0)

## 2010-05-09 LAB — BASIC METABOLIC PANEL
BUN: 13 mg/dL (ref 6–23)
CO2: 29 mEq/L (ref 19–32)
Calcium: 9.5 mg/dL (ref 8.4–10.5)
Chloride: 102 mEq/L (ref 96–112)
Creatinine, Ser: 0.8 mg/dL (ref 0.4–1.5)
Glucose, Bld: 194 mg/dL — ABNORMAL HIGH (ref 70–99)

## 2010-05-09 LAB — HIGH SENSITIVITY CRP: CRP, High Sensitivity: 5.2 mg/L — ABNORMAL HIGH (ref 0.00–5.00)

## 2010-05-09 LAB — CONVERTED CEMR LAB
Albumin ELP: 54.9 % — ABNORMAL LOW (ref 55.8–66.1)
Alpha-1-Globulin: 4.2 % (ref 2.9–4.9)
Beta Globulin: 7.3 % — ABNORMAL HIGH (ref 4.7–7.2)
Gamma Globulin: 17 % (ref 11.1–18.8)

## 2010-05-09 LAB — URINALYSIS, ROUTINE W REFLEX MICROSCOPIC
Bilirubin Urine: NEGATIVE
Ketones, ur: NEGATIVE
Leukocytes, UA: NEGATIVE
Nitrite: NEGATIVE
Specific Gravity, Urine: >=1.03 (ref 1.000–1.030)
Urobilinogen, UA: 0.2 (ref 0.0–1.0)
pH: 6 (ref 5.0–8.0)

## 2010-05-09 LAB — HM DIABETES FOOT EXAM

## 2010-05-09 LAB — HEPATIC FUNCTION PANEL
Alkaline Phosphatase: 59 U/L (ref 39–117)
Bilirubin, Direct: 0.1 mg/dL (ref 0.0–0.3)
Total Protein: 7.5 g/dL (ref 6.0–8.3)

## 2010-05-09 LAB — SEDIMENTATION RATE: Sed Rate: 12 mm/hr (ref 0–22)

## 2010-05-16 ENCOUNTER — Ambulatory Visit: Payer: BC Managed Care – PPO | Admitting: Hematology & Oncology

## 2010-05-16 NOTE — Assessment & Plan Note (Signed)
Summary: FU/RX CONSULT/LB   Vital Signs:  Patient profile:   35 year old male Height:      69 inches (175.26 cm) Weight:      275 pounds (125.00 kg) BMI:     40.76 O2 Sat:      97 % on Room air Temp:     97.4 degrees F (36.33 degrees C) oral Pulse rate:   88 / minute Pulse rhythm:   regular Resp:     18 per minute BP sitting:   160 / 90  (left arm) Cuff size:   large  Vitals Entered By: Burnard Leigh CMA(AAMA) (May 09, 2010 8:45 AM)  Nutrition Counseling: Patient's BMI is greater than 25 and therefore counseled on weight management options.  O2 Flow:  Room air CC: Rx Consult.Discuss alternative for Janumet/side effects.sls, cma, URI symptoms Is Patient Diabetic? Yes Did you bring your meter with you today? No Pain Assessment Patient in pain? no        Primary Care Provider:  [name]  CC:  Rx Consult.Discuss alternative for Janumet/side effects.sls, cma, and URI symptoms.  History of Present Illness:  Follow-Up Visit      This is a 35 year old man who presents for Follow-up visit.  The patient denies chest pain, palpitations, dizziness, syncope, low blood sugar symptoms, high blood sugar symptoms, edema, SOB, DOE, PND, and orthopnea.  Since the last visit the patient notes problems with medications.  The patient reports taking meds as prescribed, monitoring BP, monitoring blood sugars, and dietary noncompliance.  When questioned about possible medication side effects, the patient notes GI upset.    URI Symptoms      The patient also presents with URI symptoms.  The symptoms began 2 weeks ago.  The severity is described as mild.  The patient reports nasal congestion and clear nasal discharge, but denies purulent nasal discharge, sore throat, dry cough, productive cough, earache, and sick contacts.  The patient denies fever, stiff neck, dyspnea, wheezing, rash, vomiting, diarrhea, use of an antipyretic, and response to antipyretic.  The patient also reports itchy  throat, sneezing, seasonal symptoms, and response to antihistamine.  The patient denies headache, muscle aches, and severe fatigue.  The patient denies the following risk factors for Strep sinusitis: unilateral facial pain, unilateral nasal discharge, poor response to decongestant, double sickening, tooth pain, Strep exposure, tender adenopathy, and absence of cough.    Preventive Screening-Counseling & Management  Alcohol-Tobacco     Alcohol drinks/day: 0     Alcohol Counseling: not indicated; patient does not drink     Smoking Status: never     Tobacco Counseling: not indicated; no tobacco use  Hep-HIV-STD-Contraception     Hepatitis Risk: no risk noted     HIV Risk: no risk noted     STD Risk: no risk noted     SBE monthly: yes      Sexual History:  currently monogamous.        Drug Use:  never.        Blood Transfusions:  no.    Clinical Review Panels:  Immunizations   Last Tetanus Booster:  Td (03/29/2004)  Lipid Management   Cholesterol:  170 (09/28/2009)   HDL (good cholesterol):  27.90 (09/28/2009)  Diabetes Management   HgBA1C:  8.3 (03/22/2010)   Creatinine:  0.8 (04/12/2010)   Last Dilated Eye Exam:  normal (10/06/2008)   Last Foot Exam:  yes (05/09/2010)  CBC   WBC:  8.8 (  04/12/2010)   RBC:  4.82 (04/12/2010)   Hgb:  14.1 (04/12/2010)   Hct:  41.0 (04/12/2010)   Platelets:  271.0 (04/12/2010)   MCV  85.0 (04/12/2010)   MCHC  34.5 (04/12/2010)   RDW  13.7 (04/12/2010)   PMN:  53.6 (04/12/2010)   Lymphs:  29.4 (04/12/2010)   Monos:  11.4 (04/12/2010)   Eosinophils:  5.1 (04/12/2010)   Basophil:  0.5 (04/12/2010)  Complete Metabolic Panel   Glucose:  147 (04/12/2010)   Sodium:  142 (04/12/2010)   Potassium:  4.5 (04/12/2010)   Chloride:  105 (04/12/2010)   CO2:  32 (04/12/2010)   BUN:  14 (04/12/2010)   Creatinine:  0.8 (04/12/2010)   Albumin:  4.6 (04/12/2010)   Total Protein:  8.0 (04/12/2010)   Calcium:  9.9 (04/12/2010)   Total Bili:  0.5  (04/12/2010)   Alk Phos:  70 (04/12/2010)   SGPT (ALT):  42 (04/12/2010)   SGOT (AST):  31 (04/12/2010)   Medications Prior to Update: 1)  Atenolol 50 Mg Tabs (Atenolol) .... Take One By Mouth Two Times A Day 2)  Allegra 180 Mg Tabs (Fexofenadine Hcl) .... One By Mouth Once Daily For Allergies As Needed 3)  Janumet 50-500 Mg Tabs (Sitagliptin-Metformin Hcl) .... One By Mouth Two Times A Day For Diabetes 4)  Cpap 13 Hcs 5)  Neurontin 100 Mg Caps (Gabapentin) .... One By Mouth Three Times A Day As Needed 6)  Freestyle Lite Test  Strp (Glucose Blood) .... Use Two Times A Day As Directed 7)  Maxalt-Mlt 10 Mg Tbdp (Rizatriptan Benzoate) .Marland Kitchen.. 1 By Mouth Once Daily As Needed Migraine 8)  Hydrocodone-Acetaminophen 5-325 Mg Tabs (Hydrocodone-Acetaminophen) .... Take 1-2 Every 4 To 6 Hrs As Needed For Pain 9)  Ibuprofen .... As Needed 10)  Diovan 320 Mg Tabs (Valsartan) .... One By Mouth Once Daily For High Blood Pressure  Current Medications (verified): 1)  Atenolol 50 Mg Tabs (Atenolol) .... Take One By Mouth Two Times A Day 2)  Allegra 180 Mg Tabs (Fexofenadine Hcl) .... One By Mouth Once Daily For Allergies As Needed 3)  Cpap 13 Hcs 4)  Neurontin 100 Mg Caps (Gabapentin) .... One By Mouth Three Times A Day As Needed 5)  Freestyle Lite Test  Strp (Glucose Blood) .... Use Two Times A Day As Directed 6)  Maxalt-Mlt 10 Mg Tbdp (Rizatriptan Benzoate) .Marland Kitchen.. 1 By Mouth Once Daily As Needed Migraine 7)  Hydrocodone-Acetaminophen 5-325 Mg Tabs (Hydrocodone-Acetaminophen) .... Take 1-2 Every 4 To 6 Hrs As Needed For Pain 8)  Ibuprofen .... As Needed 9)  Actos 30 Mg Tabs (Pioglitazone Hcl) .... One By Mouth Once Daily For Diabetes 10)  Diovan Hct 320-12.5 Mg Tabs (Valsartan-Hydrochlorothiazide) .... One By Mouth Once Daily For High Blood Pressure  Allergies (verified): 1)  ! Pcn 2)  ! Lyrica (Pregabalin) 3)  ! 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 1/2 ppd Quit ETOH, Street drugs  Risk Factors: Alcohol Use: 0 (05/09/2010) Exercise: no (09/28/2009)  Risk Factors: Smoking Status: never (05/09/2010)  Family History: Reviewed history from 03/04/2009 and no changes  required. Parents living Sister- OSA/ CPAP Lung Cancer, clotting disorder, emphysema, heart disease  Social History: Reviewed history from 03/04/2009 and no changes required. 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  The patient denies anorexia, fever, weight loss, chest pain, syncope, dyspnea on exertion, peripheral edema, prolonged cough, headaches, hemoptysis, abdominal pain, melena, hematochezia, severe indigestion/heartburn, hematuria, suspicious skin lesions, transient blindness, difficulty walking, depression, abnormal bleeding, enlarged lymph nodes, and angioedema.   ENT:  Complains of nasal congestion; denies decreased hearing, difficulty swallowing, ear discharge, earache, hoarseness, nosebleeds, postnasal drainage, ringing in ears, sinus pressure, and sore throat. Endo:  Complains of weight change; denies cold intolerance, excessive hunger, excessive thirst, excessive urination, heat intolerance, and polyuria. Heme:  Denies abnormal bruising, bleeding, enlarge lymph nodes, fevers, pallor, and skin discoloration.  Physical Exam  General:  alert, well-developed, well-nourished, well-hydrated,  appropriate dress, normal appearance, healthy-appearing, cooperative to examination, good hygiene, and overweight-appearing.   Head:  normocephalic, atraumatic, no abnormalities observed, and no abnormalities palpated.   Eyes:  vision grossly intact, pupils equal, pupils round, and pupils reactive to light.   Ears:  R ear normal and L ear normal.   Nose:  no external deformity, no nasal discharge, no airflow obstruction, no intranasal foreign body, no nasal polyps, no nasal mucosal lesions, no mucosal friability, no active bleeding or clots, no sinus percussion tenderness, no septum abnormalities, mucosal erythema, and mucosal edema.   Mouth:  Oral mucosa and oropharynx without lesions or exudates.  Teeth in good repair. Neck:  supple, full ROM, no masses, no thyromegaly, no thyroid nodules or tenderness, no JVD, normal carotid upstroke, no carotid bruits, no cervical lymphadenopathy, and no neck tenderness.   Lungs:  normal respiratory effort, no intercostal retractions, no accessory muscle use, normal breath sounds, no dullness, no fremitus, no crackles, and no wheezes.   Heart:  normal rate, regular rhythm, no murmur, no gallop, no rub, and no JVD.   Abdomen:  soft, non-tender, normal bowel sounds, no distention, no masses, no guarding, no rigidity, no rebound tenderness, no abdominal hernia, no inguinal hernia, no hepatomegaly, and no splenomegaly.   Msk:  normal ROM, no joint swelling, no joint warmth, no redness over joints, no joint deformities, no joint instability, no crepitation, and no muscle atrophy.  there is extensive ecchymosis over the right hip with mild ttp but no boney derangements. Pulses:  R and L carotid,radial,femoral,dorsalis pedis and posterior tibial pulses are full and equal bilaterally Extremities:  No clubbing, cyanosis, edema, or deformity noted with normal full range of motion of all joints.   Neurologic:  No cranial nerve deficits noted. Station and gait are normal.  Plantar reflexes are down-going bilaterally. DTRs are symmetrical throughout. Sensory, motor and coordinative functions appear intact. Skin:  Intact without suspicious lesions or rashes Cervical Nodes:  No lymphadenopathy noted Axillary Nodes:  No palpable lymphadenopathy Psych:  Cognition and judgment appear intact. Alert and cooperative with normal attention span and concentration. No apparent delusions, illusions, hallucinations  Diabetes Management Exam:    Foot Exam (with socks and/or shoes not present):       Sensory-Pinprick/Light touch:          Left medial foot (L-4): normal          Left dorsal foot (L-5): normal          Left lateral foot (S-1): normal  Right medial foot (L-4): normal          Right dorsal foot (L-5): normal          Right lateral foot (S-1): normal       Sensory-Monofilament:          Left foot: normal          Right foot: normal       Inspection:          Left foot: normal          Right foot: normal       Nails:          Left foot: normal          Right foot: normal   Impression & Recommendations:  Problem # 1:  ALLERGIC RHINITIS (ICD-477.9) Assessment Deteriorated will give him a dose of depo-medrol IM to reduce the symptoms His updated medication list for this problem includes:    Allegra 180 Mg Tabs (Fexofenadine hcl) ..... One by mouth once daily for allergies as needed  Problem # 2:  LYMPHADENITIS, MESENTERIC (ICD-289.2) Assessment: Unchanged  Orders: TLB-Lipid Panel (80061-LIPID) TLB-BMP (Basic Metabolic Panel-BMET) (80048-METABOL) TLB-CBC Platelet - w/Differential (85025-CBCD) TLB-Hepatic/Liver Function Pnl (80076-HEPATIC) TLB-TSH (Thyroid Stimulating Hormone) (84443-TSH) TLB-CRP-High Sensitivity (C-Reactive Protein) (86140-FCRP) TLB-Sedimentation Rate (ESR) (85652-ESR) T-SPE w/reflex to IFE (95621-30865) TLB-Udip w/ Micro (81001-URINE) TLB-A1C / Hgb A1C (Glycohemoglobin) (83036-A1C) Hematology Referral  (Hematology)  Problem # 3:  HYPERTENSION (ICD-401.9) Assessment: Deteriorated  The following medications were removed from the medication list:    Diovan 320 Mg Tabs (Valsartan) ..... One by mouth once daily for high blood pressure His updated medication list for this problem includes:    Atenolol 50 Mg Tabs (Atenolol) .Marland Kitchen... Take one by mouth two times a day    Diovan Hct 320-12.5 Mg Tabs (Valsartan-hydrochlorothiazide) ..... One by mouth once daily for high blood pressure  Orders: TLB-Lipid Panel (80061-LIPID) TLB-BMP (Basic Metabolic Panel-BMET) (80048-METABOL) TLB-CBC Platelet - w/Differential (85025-CBCD) TLB-Hepatic/Liver Function Pnl (80076-HEPATIC) TLB-TSH (Thyroid Stimulating Hormone) (84443-TSH) TLB-CRP-High Sensitivity (C-Reactive Protein) (86140-FCRP) TLB-Sedimentation Rate (ESR) (85652-ESR) T-SPE w/reflex to IFE (78469-62952) TLB-Udip w/ Micro (81001-URINE) TLB-A1C / Hgb A1C (Glycohemoglobin) (83036-A1C)  BP today: 160/90 Prior BP: 150/86 (04/12/2010)  Prior 10 Yr Risk Heart Disease: Not enough information (04/25/2009)  Labs Reviewed: K+: 4.5 (04/12/2010) Creat: : 0.8 (04/12/2010)   Chol: 170 (09/28/2009)   HDL: 27.90 (09/28/2009)   TG: 275.0 (09/28/2009)  Problem # 4:  DIABETES MELLITUS, TYPE II (ICD-250.00) Assessment: Deteriorated  The following medications were removed from the medication list:    Janumet 50-500 Mg Tabs (Sitagliptin-metformin hcl) ..... One by mouth two times a day for diabetes    Diovan 320 Mg Tabs (Valsartan) ..... One by mouth once daily for high blood pressure His updated medication list for this problem includes:    Actos 30 Mg Tabs (Pioglitazone hcl) ..... One by mouth once daily for diabetes    Diovan Hct 320-12.5 Mg Tabs (Valsartan-hydrochlorothiazide) ..... One by mouth once daily for high blood pressure  Orders: TLB-Lipid Panel (80061-LIPID) TLB-BMP (Basic Metabolic Panel-BMET) (80048-METABOL) TLB-CBC Platelet - w/Differential  (85025-CBCD) TLB-Hepatic/Liver Function Pnl (80076-HEPATIC) TLB-TSH (Thyroid Stimulating Hormone) (84443-TSH) TLB-CRP-High Sensitivity (C-Reactive Protein) (86140-FCRP) TLB-Sedimentation Rate (ESR) (85652-ESR) T-SPE w/reflex to IFE (84132-44010) TLB-Udip w/ Micro (81001-URINE) TLB-A1C / Hgb A1C (Glycohemoglobin) (83036-A1C) Diabetic Clinic Referral (Diabetic)  Labs Reviewed: Creat: 0.8 (04/12/2010)     Last Eye Exam: normal (10/06/2008) Reviewed HgBA1c results: 8.3 (03/22/2010)  6.6 (09/28/2009)  Problem # 5:  OBESITY (  ICD-278.00) Assessment: New  Orders: Nutrition Referral (Nutrition)  Ht: 69 (05/09/2010)   Wt: 275 (05/09/2010)   BMI: 40.76 (05/09/2010)  Complete Medication List: 1)  Atenolol 50 Mg Tabs (Atenolol) .... Take one by mouth two times a day 2)  Allegra 180 Mg Tabs (Fexofenadine hcl) .... One by mouth once daily for allergies as needed 3)  Cpap 13 Hcs  4)  Neurontin 100 Mg Caps (Gabapentin) .... One by mouth three times a day as needed 5)  Freestyle Lite Test Strp (Glucose blood) .... Use two times a day as directed 6)  Maxalt-mlt 10 Mg Tbdp (Rizatriptan benzoate) .Marland Kitchen.. 1 by mouth once daily as needed migraine 7)  Hydrocodone-acetaminophen 5-325 Mg Tabs (Hydrocodone-acetaminophen) .... Take 1-2 every 4 to 6 hrs as needed for pain 8)  Ibuprofen  .... As needed 9)  Actos 30 Mg Tabs (Pioglitazone hcl) .... One by mouth once daily for diabetes 10)  Diovan Hct 320-12.5 Mg Tabs (Valsartan-hydrochlorothiazide) .... One by mouth once daily for high blood pressure  Other Orders: Depo- Medrol 40mg  (J1030) Admin of Therapeutic Inj  intramuscular or subcutaneous (16109)  Patient Instructions: 1)  Please schedule a follow-up appointment in 2 months. 2)  It is important that you exercise regularly at least 20 minutes 5 times a week. If you develop chest pain, have severe difficulty breathing, or feel very tired , stop exercising immediately and seek medical attention. 3)  You  need to lose weight. Consider a lower calorie diet and regular exercise.  4)  Check your blood sugars regularly. If your readings are usually above 200 or below 70 you should contact our office. 5)  It is important that your Diabetic A1c level is checked every 3 months. 6)  See your eye doctor yearly to check for diabetic eye damage. 7)  Check your feet each night for sore areas, calluses or signs of infection. 8)  Check your Blood Pressure regularly. If it is above 130/80: you should make an appointment. Prescriptions: DIOVAN HCT 320-12.5 MG TABS (VALSARTAN-HYDROCHLOROTHIAZIDE) One by mouth once daily for high blood pressure  #56 x 0   Entered and Authorized by:   [name]   Signed by:   [name] on 05/09/2010   Method used:   Samples Given   RxID:   [id number] ACTOS 30 MG TABS (PIOGLITAZONE HCL) One by mouth once daily for diabetes  #84 x 0   Entered and Authorized by:   [name]   Signed by:   [name] on 05/09/2010   Method used:   Samples Given   RxID:   [id number]    Medication Administration  Injection # 1:    Medication: Depo- Medrol 40mg     Diagnosis: ASTHMA, UNSPECIFIED, UNSPECIFIED STATUS (ICD-493.90)    Route: IM    Site: L deltoid    Exp Date: 12/2012    Lot #: [id number]    Mfr: [employer]    Comments: 120mg     Patient tolerated injection without complications    Given by: Lamar Sprinkles, CMA (May 09, 2010 9:27 AM)  Orders Added: 1)  TLB-Lipid Panel [80061-LIPID] 2)  TLB-BMP (Basic Metabolic Panel-BMET) [80048-METABOL] 3)  TLB-CBC Platelet - w/Differential [85025-CBCD] 4)  TLB-Hepatic/Liver Function Pnl [80076-HEPATIC] 5)  TLB-TSH (Thyroid Stimulating Hormone) [84443-TSH] 6)  TLB-CRP-High Sensitivity (C-Reactive Protein) [86140-FCRP] 7)  TLB-Sedimentation Rate (ESR) [85652-ESR] 8)  T-SPE w/reflex to IFE [29528-41324] 9)  TLB-Udip w/ Micro [81001-URINE] 10)  TLB-A1C / Hgb  A1C (Glycohemoglobin) [83036-A1C] 11)   Hematology Referral [Hematology] 12)  Nutrition Referral [Nutrition] 13)  Diabetic Clinic Referral [Diabetic] 14)  Depo- Medrol 40mg  [J1030] 15)  Admin of Therapeutic Inj  intramuscular or subcutaneous [96372] 16)  Est. Patient Level V [16109]     Medication Administration  Injection # 1:    Medication: Depo- Medrol 40mg     Diagnosis: ASTHMA, UNSPECIFIED, UNSPECIFIED STATUS (ICD-493.90)    Route: IM    Site: L deltoid    Exp Date: 12/2012    Lot #: [id number]    Mfr: [employer]    Comments: 120mg     Patient tolerated injection without complications    Given by: Lamar Sprinkles, CMA (May 09, 2010 9:27 AM)  Orders Added: 1)  TLB-Lipid Panel [80061-LIPID] 2)  TLB-BMP (Basic Metabolic Panel-BMET) [80048-METABOL] 3)  TLB-CBC Platelet - w/Differential [85025-CBCD] 4)  TLB-Hepatic/Liver Function Pnl [80076-HEPATIC] 5)  TLB-TSH (Thyroid Stimulating Hormone) [84443-TSH] 6)  TLB-CRP-High Sensitivity (C-Reactive Protein) [86140-FCRP] 7)  TLB-Sedimentation Rate (ESR) [85652-ESR] 8)  T-SPE w/reflex to IFE [60454-09811] 9)  TLB-Udip w/ Micro [81001-URINE] 10)  TLB-A1C / Hgb A1C (Glycohemoglobin) [83036-A1C] 11)  Hematology Referral [Hematology] 12)  Nutrition Referral [Nutrition] 13)  Diabetic Clinic Referral [Diabetic] 14)  Depo- Medrol 40mg  [J1030] 15)  Admin of Therapeutic Inj  intramuscular or subcutaneous [96372] 16)  Est. Patient Level V [91478]

## 2010-05-30 ENCOUNTER — Ambulatory Visit: Payer: BC Managed Care – PPO | Admitting: Dietician

## 2010-06-08 ENCOUNTER — Encounter (HOSPITAL_BASED_OUTPATIENT_CLINIC_OR_DEPARTMENT_OTHER): Payer: BC Managed Care – PPO | Admitting: Hematology & Oncology

## 2010-06-08 ENCOUNTER — Other Ambulatory Visit: Payer: Self-pay | Admitting: Hematology & Oncology

## 2010-06-08 ENCOUNTER — Ambulatory Visit (HOSPITAL_BASED_OUTPATIENT_CLINIC_OR_DEPARTMENT_OTHER): Payer: BC Managed Care – PPO | Admitting: Hematology & Oncology

## 2010-06-08 ENCOUNTER — Other Ambulatory Visit (HOSPITAL_COMMUNITY)
Admission: RE | Admit: 2010-06-08 | Discharge: 2010-06-08 | Disposition: A | Discharge status: Home / Self Care | Payer: BC Managed Care – PPO | Source: Ambulatory Visit | Attending: Hematology & Oncology | Admitting: Hematology & Oncology

## 2010-06-08 DIAGNOSIS — R599 Enlarged lymph nodes, unspecified: Secondary | ICD-10-CM

## 2010-06-08 DIAGNOSIS — I10 Essential (primary) hypertension: Secondary | ICD-10-CM

## 2010-06-08 DIAGNOSIS — C772 Secondary and unspecified malignant neoplasm of intra-abdominal lymph nodes: Secondary | ICD-10-CM

## 2010-06-08 DIAGNOSIS — E119 Type 2 diabetes mellitus without complications: Secondary | ICD-10-CM

## 2010-06-08 DIAGNOSIS — I880 Nonspecific mesenteric lymphadenitis: Secondary | ICD-10-CM

## 2010-06-08 LAB — CBC WITH DIFFERENTIAL (CANCER CENTER ONLY)
BASO%: 0.6 % (ref 0.0–2.0)
EOS%: 3.8 % (ref 0.0–7.0)
LYMPH#: 2.3 10*3/uL (ref 0.9–3.3)
MCH: 28.3 pg (ref 28.0–33.4)
MCHC: 34.2 g/dL (ref 32.0–35.9)
MONO%: 6.8 % (ref 0.0–13.0)
NEUT#: 6.8 10*3/uL — ABNORMAL HIGH (ref 1.5–6.5)
Platelets: 302 10*3/uL (ref 145–400)

## 2010-06-08 LAB — CHCC SATELLITE - SMEAR

## 2010-06-08 LAB — TECHNOLOGIST REVIEW CHCC SATELLITE

## 2010-06-09 ENCOUNTER — Other Ambulatory Visit: Payer: Self-pay | Admitting: Hematology & Oncology

## 2010-06-09 DIAGNOSIS — C772 Secondary and unspecified malignant neoplasm of intra-abdominal lymph nodes: Secondary | ICD-10-CM

## 2010-06-12 LAB — COMPREHENSIVE METABOLIC PANEL
ALT: 34 U/L (ref 0–53)
AST: 24 U/L (ref 0–37)
Alkaline Phosphatase: 60 U/L (ref 39–117)
Potassium: 3.9 mEq/L (ref 3.5–5.3)
Sodium: 142 mEq/L (ref 135–145)
Total Bilirubin: 0.3 mg/dL (ref 0.3–1.2)
Total Protein: 8.1 g/dL (ref 6.0–8.3)

## 2010-06-12 LAB — VITAMIN D 25 HYDROXY (VIT D DEFICIENCY, FRACTURES): Vit D, 25-Hydroxy: 35 ng/mL (ref 30–89)

## 2010-06-12 LAB — SPEP & IFE WITH QIG
Albumin ELP: 54.9 % — ABNORMAL LOW (ref 55.8–66.1)
Alpha-1-Globulin: 4.1 % (ref 2.9–4.9)
Gamma Globulin: 17.1 % (ref 11.1–18.8)
IgM, Serum: 92 mg/dL (ref 60–263)
Total Protein, Serum Electrophoresis: 8.1 g/dL (ref 6.0–8.3)

## 2010-06-12 LAB — LACTATE DEHYDROGENASE: LDH: 191 U/L (ref 94–250)

## 2010-06-13 LAB — FLOW CYTOMETRY - CHCC SATELLITE

## 2010-07-11 ENCOUNTER — Ambulatory Visit: Payer: BC Managed Care – PPO | Admitting: Internal Medicine

## 2010-07-20 ENCOUNTER — Ambulatory Visit: Payer: BC Managed Care – PPO | Admitting: Internal Medicine

## 2010-07-25 ENCOUNTER — Telehealth: Payer: Self-pay | Admitting: *Deleted

## 2010-07-25 MED ORDER — PIOGLITAZONE HCL 30 MG PO TABS: 30.0000 mg | ORAL_TABLET | Freq: Every day | ORAL | Status: DC

## 2010-07-25 MED ORDER — VALSARTAN-HYDROCHLOROTHIAZIDE 320-12.5 MG PO TABS: 1.0000 | ORAL_TABLET | Freq: Every day | ORAL | Status: DC

## 2010-07-25 NOTE — Telephone Encounter (Signed)
Pt needs rfs of Diovan and actos. Done, pt aware

## 2010-07-31 ENCOUNTER — Ambulatory Visit: Payer: BC Managed Care – PPO | Admitting: Internal Medicine

## 2010-10-04 ENCOUNTER — Other Ambulatory Visit (HOSPITAL_BASED_OUTPATIENT_CLINIC_OR_DEPARTMENT_OTHER): Payer: BC Managed Care – PPO

## 2010-10-23 ENCOUNTER — Other Ambulatory Visit: Payer: Self-pay | Admitting: Internal Medicine

## 2010-11-04 ENCOUNTER — Other Ambulatory Visit: Payer: Self-pay | Admitting: Internal Medicine

## 2011-02-04 ENCOUNTER — Other Ambulatory Visit: Payer: Self-pay | Admitting: Internal Medicine

## 2011-02-07 ENCOUNTER — Other Ambulatory Visit: Payer: Self-pay | Admitting: Internal Medicine

## 2011-02-12 ENCOUNTER — Other Ambulatory Visit: Payer: Self-pay | Admitting: Internal Medicine

## 2011-11-10 IMAGING — CR DG HIP COMPLETE 2+V*R*
3 series · 3 of 3 positions shown · non-contrast
Comparison: CT 03/16/2010.

CLINICAL DATA: Right hip pain.  Trauma 1 week previously.

RIGHT HIP - COMPLETE 2+ VIEW

[view not recorded (1 of 3)]
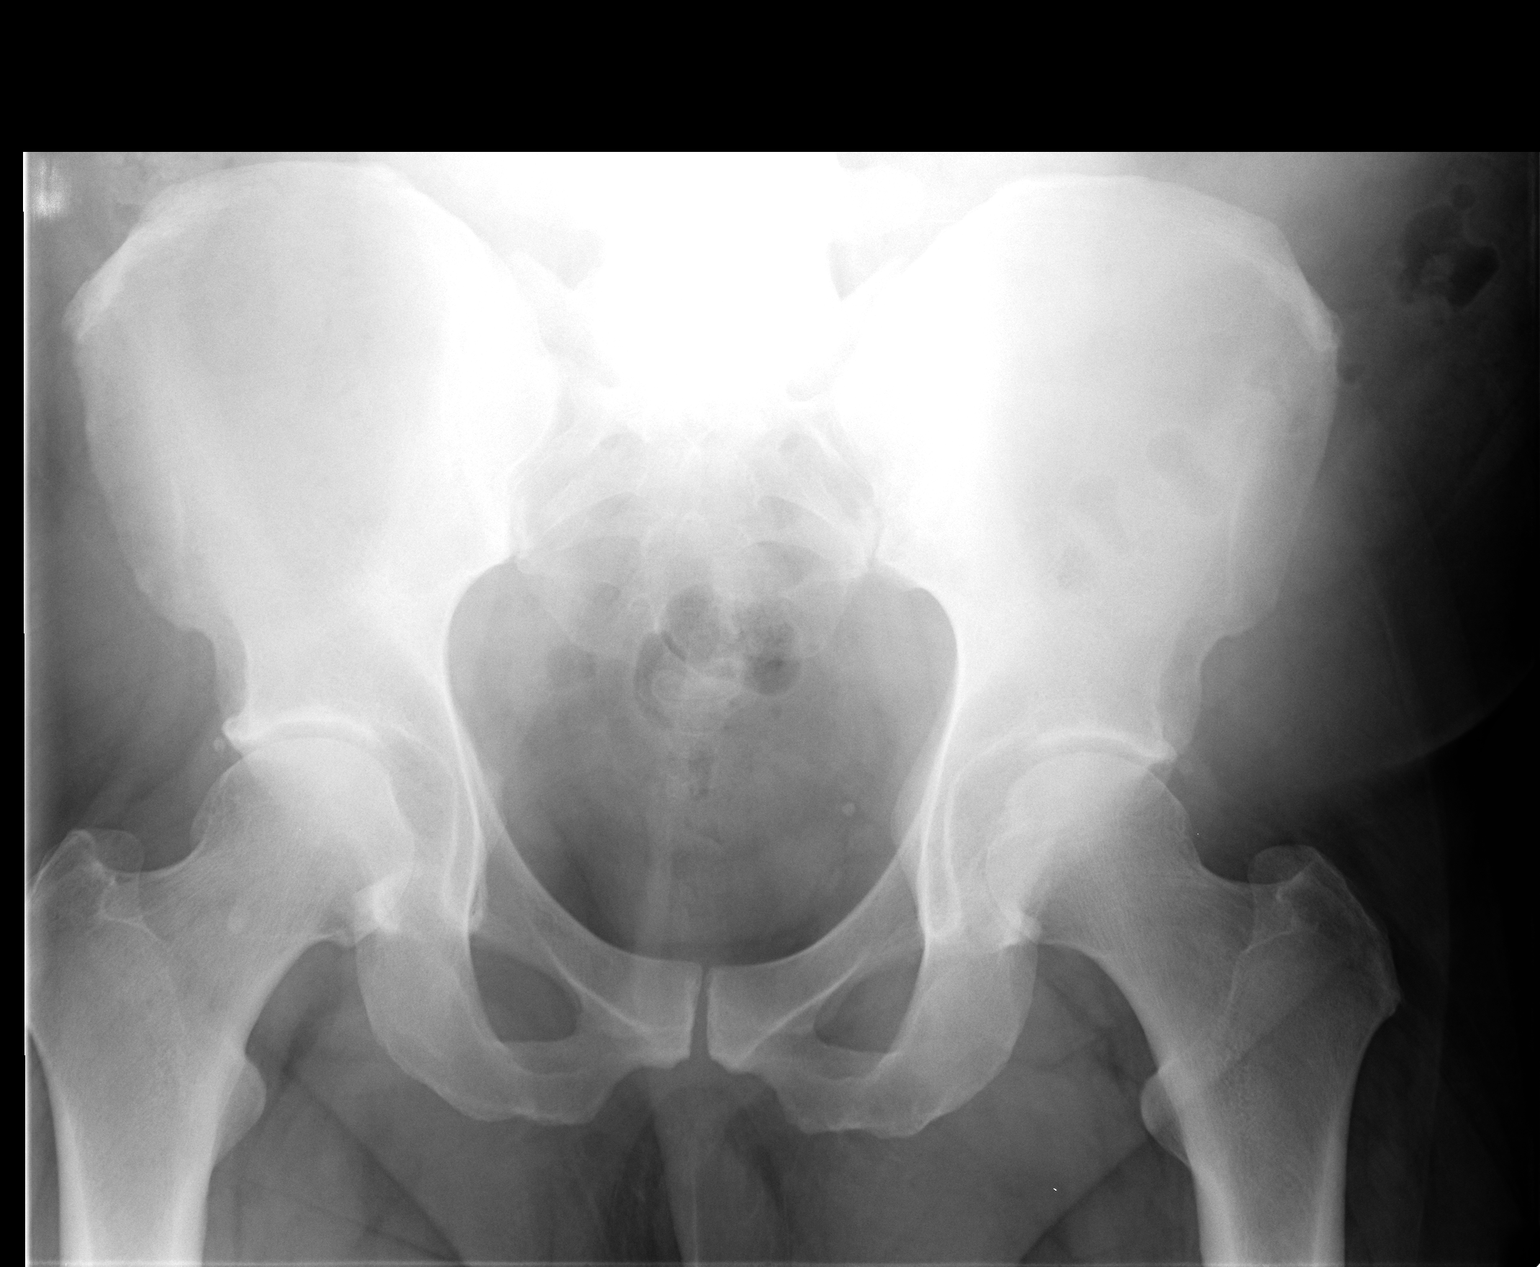

[view not recorded (2 of 3)]
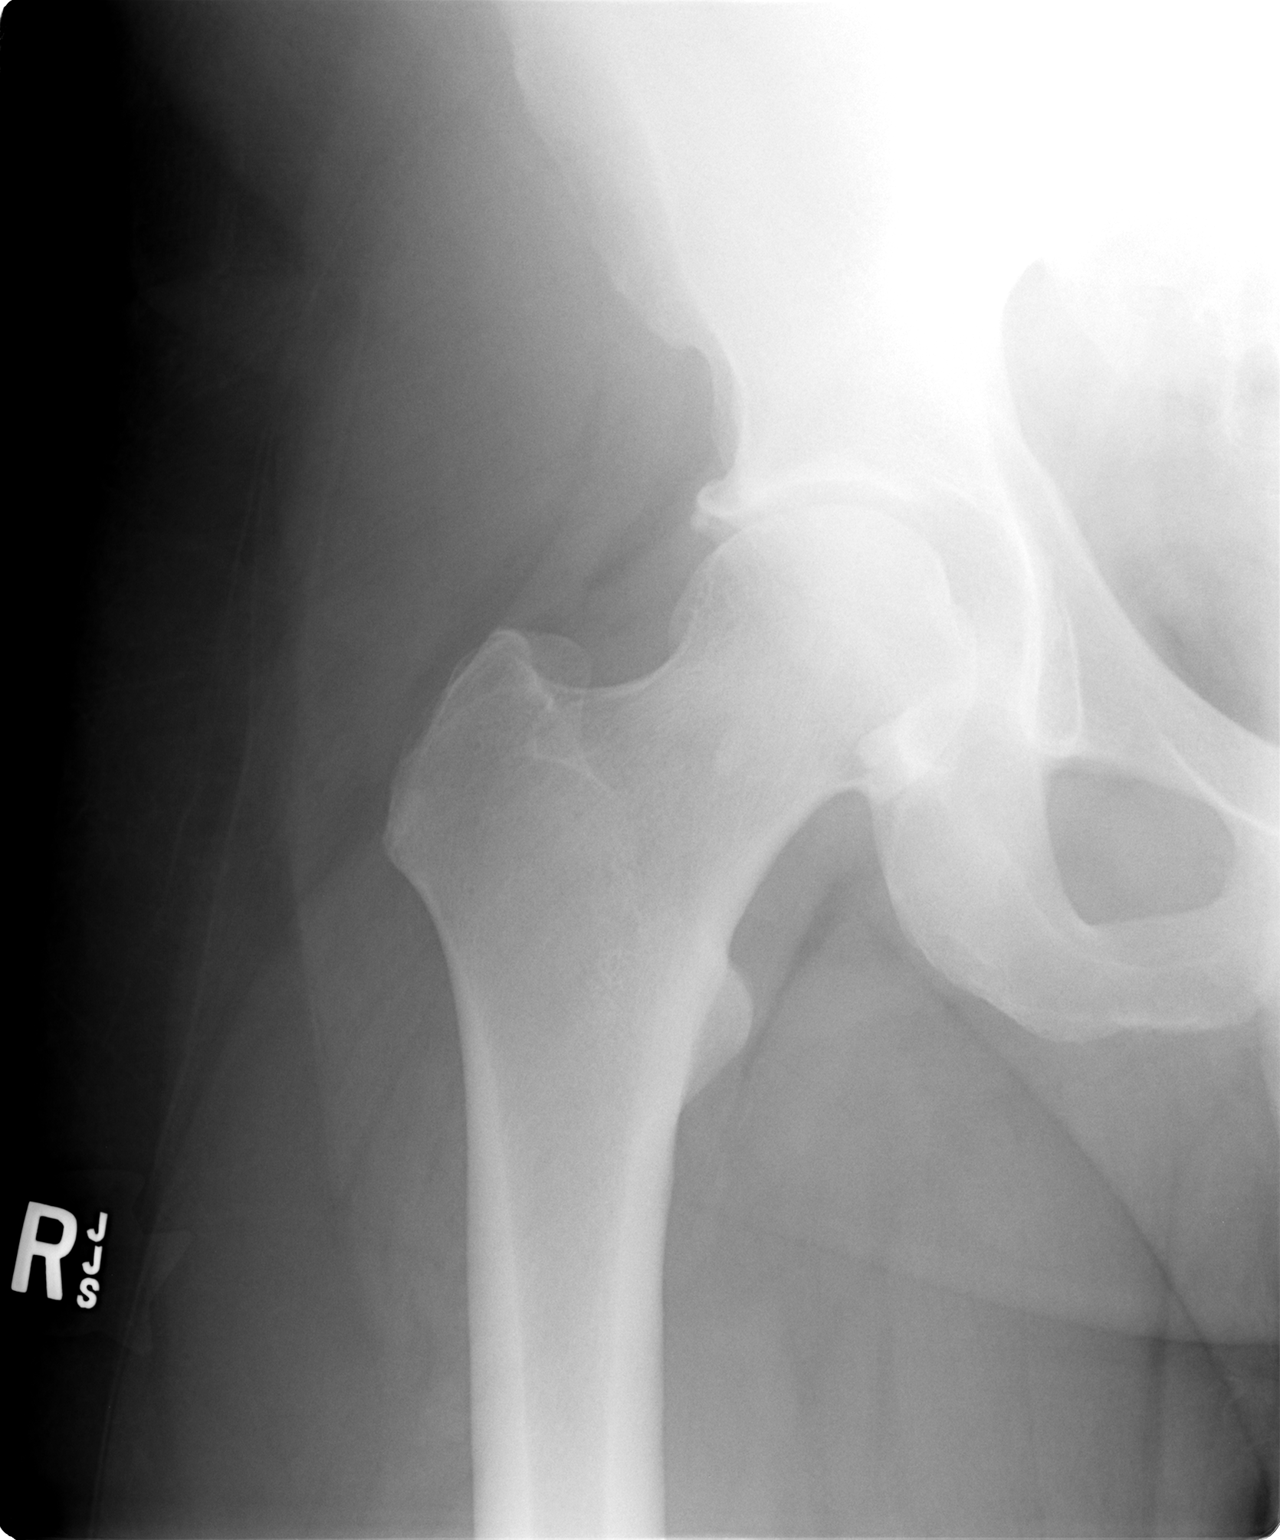

[view not recorded (3 of 3)]
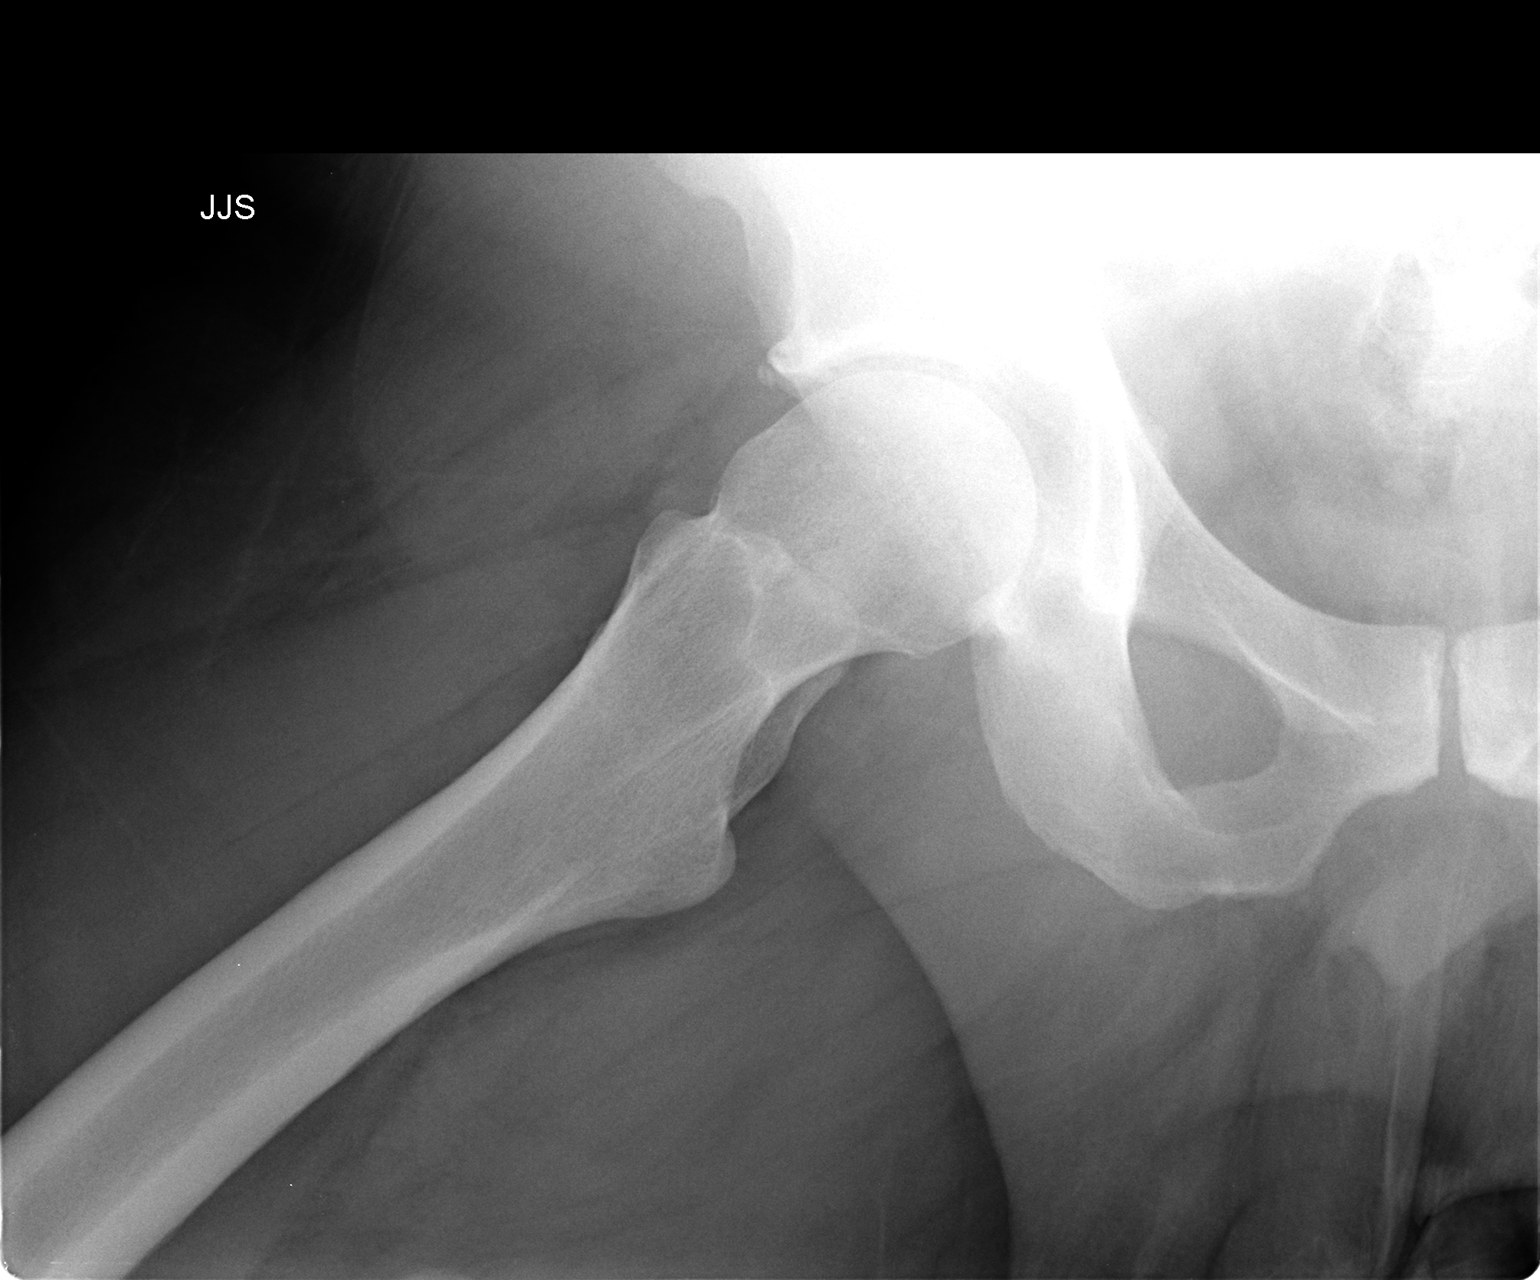

[3 of 3 positions shown; findings below may reference images not displayed]

FINDINGS: Hip joint spaces are preserved.

No fracture or dislocation is seen.
There is minimal enthesophyte spurring of femoral head.  Small
sclerotic density is unchanged in the right femoral neck consistent
with benign bone island.  No calcific bursitis is seen.
IMPRESSION: No fracture or dislocation.  Minimal spurring.  Benign bone island.

## 2011-11-29 ENCOUNTER — Other Ambulatory Visit: Payer: Self-pay | Admitting: Internal Medicine

## 2015-07-28 DIAGNOSIS — G43909 Migraine, unspecified, not intractable, without status migrainosus: Secondary | ICD-10-CM | POA: Diagnosis not present

## 2015-07-28 DIAGNOSIS — I10 Essential (primary) hypertension: Secondary | ICD-10-CM | POA: Diagnosis not present

## 2015-07-28 DIAGNOSIS — E119 Type 2 diabetes mellitus without complications: Secondary | ICD-10-CM | POA: Diagnosis not present

## 2015-08-15 DIAGNOSIS — E119 Type 2 diabetes mellitus without complications: Secondary | ICD-10-CM | POA: Diagnosis not present

## 2015-08-15 DIAGNOSIS — I10 Essential (primary) hypertension: Secondary | ICD-10-CM | POA: Diagnosis not present

## 2015-08-15 DIAGNOSIS — Z0000 Encounter for general adult medical examination without abnormal findings: Secondary | ICD-10-CM | POA: Diagnosis not present

## 2015-08-15 DIAGNOSIS — E7800 Pure hypercholesterolemia, unspecified: Secondary | ICD-10-CM | POA: Diagnosis not present

## 2015-10-27 ENCOUNTER — Ambulatory Visit: Payer: BLUE CROSS/BLUE SHIELD | Admitting: Neurology

## 2016-01-03 ENCOUNTER — Telehealth: Payer: Self-pay | Admitting: *Deleted

## 2016-01-03 NOTE — Telephone Encounter (Signed)
error 

## 2016-07-02 ENCOUNTER — Ambulatory Visit (INDEPENDENT_AMBULATORY_CARE_PROVIDER_SITE_OTHER): Payer: BLUE CROSS/BLUE SHIELD | Admitting: Family Medicine

## 2016-07-02 ENCOUNTER — Encounter: Payer: Self-pay | Admitting: Family Medicine

## 2016-07-02 VITALS — BP 142/78 | HR 60 | Temp 98.4°F | Ht 69.0 in | Wt 259.0 lb

## 2016-07-02 DIAGNOSIS — E669 Obesity, unspecified: Secondary | ICD-10-CM | POA: Insufficient documentation

## 2016-07-02 DIAGNOSIS — I10 Essential (primary) hypertension: Secondary | ICD-10-CM | POA: Diagnosis not present

## 2016-07-02 DIAGNOSIS — E119 Type 2 diabetes mellitus without complications: Secondary | ICD-10-CM | POA: Diagnosis not present

## 2016-07-02 LAB — POCT GLYCOSYLATED HEMOGLOBIN (HGB A1C): Hemoglobin A1C: 5.3

## 2016-07-02 MED ORDER — VALSARTAN-HYDROCHLOROTHIAZIDE 80-12.5 MG PO TABS: 1.0000 | ORAL_TABLET | Freq: Every day | ORAL | 3 refills | Status: DC

## 2016-07-02 MED ORDER — PHENTERMINE-TOPIRAMATE ER 7.5-46 MG PO CP24: 1.0000 | ORAL_CAPSULE | Freq: Every day | ORAL | 0 refills | Status: DC

## 2016-07-02 MED ORDER — PHENTERMINE-TOPIRAMATE ER 3.75-23 MG PO CP24: 1.0000 | ORAL_CAPSULE | Freq: Every day | ORAL | 0 refills | Status: DC

## 2016-07-02 NOTE — Progress Notes (Signed)
Robert Gamble is a 41 y.o. male is here to Robert Gamble.   History of Present Illness:   Water quality scientist, CMA, acting as scribe for Dr. Juleen Gamble.  Hypertension  This is a chronic problem. The current episode started more than 1 year ago. The problem has been gradually worsening since onset. The problem is uncontrolled. Pertinent negatives include no blurred vision, chest pain, headaches, neck pain, palpitations, peripheral edema or shortness of breath. There are no associated agents to hypertension.  Diabetes  He presents for his initial diabetic visit. He has type 2 diabetes mellitus. His disease course has been worsening. Pertinent negatives for hypoglycemia include no dizziness, headaches or nervousness/anxiousness. Pertinent negatives for diabetes include no blurred vision and no chest pain. Symptoms are worsening. His weight is increasing rapidly. His dinner blood glucose range is generally 90-110 mg/dl.  Migraine   This is a chronic problem. The problem occurs monthly. The pain is located in the frontal region. The pain does not radiate. The quality of the pain is described as throbbing. The pain is moderate. Pertinent negatives include no abdominal pain, back pain, blurred vision, dizziness, ear pain, fever, nausea, neck pain, sore throat or vomiting. Nothing aggravates the symptoms. He has tried beta blockers, triptans, acetaminophen and NSAIDs for the symptoms. The treatment provided moderate relief. His past medical history is significant for hypertension.   Health Maintenance Due  Topic Date Due  . PNEUMOCOCCAL POLYSACCHARIDE VACCINE (1) 08/08/1978  . URINE MICROALBUMIN  08/08/1986  . HIV Screening  08/08/1991  . FOOT EXAM  05/09/2011   PMHx, SurgHx, SocialHx, Medications, and Allergies were reviewed in the Visit Navigator and updated as appropriate.   Past Medical History:  Diagnosis Date  . Allergic rhinitis   . Chronic headache   . Diabetes mellitus 2   . HTN  (hypertension)   . OSA on CPAP    Past Surgical History:  Procedure Laterality Date  . APPENDECTOMY     Family History  Problem Relation Age of Onset  . Hypertension Father   . Sleep apnea Sister   . Diabetes Sister   . Hyperlipidemia Sister   . Hypertension Sister   . Mental illness Sister   . Lung cancer    . Clotting disorder    . Emphysema    . Heart disease    . Cancer Maternal Grandmother   . Cancer Maternal Grandfather    Social History  Substance Use Topics  . Smoking status: Former Research scientist (life sciences)  . Smokeless tobacco: Never Used  . Alcohol use No   Current Medications and Allergies:   .  SUMAtriptan (IMITREX) 100 MG tablet, Take 100 mg by mouth as needed for migraine. May repeat in 2 hours if headache persists or recurs., Disp: , Rfl:  .  verapamil (CALAN) 120 MG tablet, Take 120 mg by mouth 3 (three) times daily., Disp: , Rfl:   Allergies  Allergen Reactions  . Penicillins     REACTION: rash  . Pregabalin     REACTION: SOB MOOD SWINGS  . Ramipril     REACTION: cough   Review of Systems:   Review of Systems  Constitutional: Negative for chills and fever.  HENT: Negative for ear pain and sore throat.   Eyes: Negative for blurred vision.  Respiratory: Negative for shortness of breath.   Cardiovascular: Negative for chest pain and palpitations.  Gastrointestinal: Negative for abdominal pain, nausea and vomiting.  Genitourinary: Negative for frequency.  Musculoskeletal: Negative for back pain  and neck pain.  Skin: Negative for rash.  Neurological: Negative for dizziness, loss of consciousness and headaches.  Psychiatric/Behavioral: Negative for depression. The patient is not nervous/anxious.    Vitals:   Vitals:   07/02/16 0944  BP: (!) 142/78  Pulse: 60  Temp: 98.4 F (36.9 C)  TempSrc: Oral  SpO2: 97%  Weight: 259 lb (117.5 kg)  Height: 5\' 9"  (1.753 m)     Body mass index is 38.25 kg/m.  Physical Exam:   Physical Exam  Constitutional: He is  oriented to person, place, and time. He appears well-developed and well-nourished. No distress.  HENT:  Head: Normocephalic and atraumatic.  Right Ear: External ear normal.  Left Ear: External ear normal.  Nose: Nose normal.  Mouth/Throat: Oropharynx is clear and moist.  Eyes: Conjunctivae and EOM are normal. Pupils are equal, round, and reactive to light.  Neck: Normal range of motion. Neck supple.  Cardiovascular: Normal rate, regular rhythm, normal heart sounds and intact distal pulses.   Pulmonary/Chest: Effort normal and breath sounds normal.  Abdominal: Soft. Bowel sounds are normal.  Musculoskeletal: Normal range of motion.  Neurological: He is alert and oriented to person, place, and time.  Skin: Skin is warm and dry. Capillary refill takes less than 2 seconds.  Psychiatric: He has a normal mood and affect. His behavior is normal. Judgment and thought content normal.  Nursing note and vitals reviewed.  Results for orders placed or performed in visit on 07/02/16  POCT glycosylated hemoglobin (Hb A1C)  Result Value Ref Range   Hemoglobin A1C 5.3    Assessment and Plan:   Robert Gamble was seen today for establish care, hypertension, weight gain and diabetes.  Diagnoses and all orders for this visit:  Type 2 diabetes mellitus without complication, without long-term current use of insulin (HCC) Comments: Normal A1c. We'll check insulin level to see if there is still some insulin resistance. He may still benefit from metformin. Orders: -     POCT glycosylated hemoglobin (Hb A1C) -     CBC; Future -     Comprehensive metabolic panel; Future -     TSH; Future -     Insulin, Free (Bioactive); Future  Morbid obesity ([place]) Comments: Reviewed healthy diet and exercise choices. The patient is interested in medication for weight loss. After discussion, patient would like to start below medication. Expectations, risks, and potential side effects reviewed.  Orders: -      Phentermine-Topiramate 3.75-23 MG CP24; Take 1 capsule by mouth daily. -     Phentermine-Topiramate 7.5-46 MG CP24; Take 1 capsule by mouth daily. -     CBC; Future -     Comprehensive metabolic panel; Future -     TSH; Future  Essential hypertension Comments: Mild and will improve with weight loss. Red flags reviewed. He will monitor at home. Medication change today. Orders: -     valsartan-hydrochlorothiazide (DIOVAN-HCT) 80-12.5 MG tablet; Take 1 tablet by mouth daily. -     CBC; Future -     Comprehensive metabolic panel; Future -     TSH; Future    . Reviewed expectations re: course of current medical issues. . Discussed self-management of symptoms. . Outlined signs and symptoms indicating need for more acute intervention. . Patient verbalized understanding and all questions were answered. . See orders for this visit as documented in the electronic medical record. . Patient received an After Visit Summary.  Records requested if needed. I spent 45 minutes with this patient,  greater than 50% was face-to-face time counseling regarding the above diagnoses.  CMA served as Education administrator during this visit. History, Physical, and Plan performed by medical provider. Documentation and orders reviewed and attested to. Briscoe Deutscher, D.O.  Briscoe Deutscher, [place] [place] 07/02/2016  Future Appointments Date Time Provider [place]  07/03/2016 8:15 AM [facility] None  10/02/2016 7:30 AM Briscoe Deutscher, DO [facility] None

## 2016-07-03 ENCOUNTER — Other Ambulatory Visit: Payer: Self-pay

## 2016-07-03 ENCOUNTER — Telehealth: Payer: Self-pay | Admitting: Family Medicine

## 2016-07-03 ENCOUNTER — Other Ambulatory Visit (INDEPENDENT_AMBULATORY_CARE_PROVIDER_SITE_OTHER): Payer: BLUE CROSS/BLUE SHIELD

## 2016-07-03 DIAGNOSIS — I10 Essential (primary) hypertension: Secondary | ICD-10-CM | POA: Diagnosis not present

## 2016-07-03 DIAGNOSIS — E119 Type 2 diabetes mellitus without complications: Secondary | ICD-10-CM | POA: Diagnosis not present

## 2016-07-03 LAB — COMPREHENSIVE METABOLIC PANEL WITH GFR
ALT: 31 U/L (ref 0–53)
AST: 30 U/L (ref 0–37)
Albumin: 4.5 g/dL (ref 3.5–5.2)
Alkaline Phosphatase: 54 U/L (ref 39–117)
BUN: 13 mg/dL (ref 6–23)
CO2: 28 meq/L (ref 19–32)
Calcium: 9.5 mg/dL (ref 8.4–10.5)
Chloride: 104 meq/L (ref 96–112)
Creatinine, Ser: 0.84 mg/dL (ref 0.40–1.50)
GFR: 107.62 mL/min
Glucose, Bld: 99 mg/dL (ref 70–99)
Potassium: 3.7 meq/L (ref 3.5–5.1)
Sodium: 140 meq/L (ref 135–145)
Total Bilirubin: 0.6 mg/dL (ref 0.2–1.2)
Total Protein: 8 g/dL (ref 6.0–8.3)

## 2016-07-03 LAB — CBC
HCT: 42 % (ref 39.0–52.0)
Hemoglobin: 14.7 g/dL (ref 13.0–17.0)
MCHC: 35 g/dL (ref 30.0–36.0)
MCV: 85.2 fl (ref 78.0–100.0)
Platelets: 237 10*3/uL (ref 150.0–400.0)
RBC: 4.93 Mil/uL (ref 4.22–5.81)
RDW: 13.1 % (ref 11.5–15.5)
WBC: 5.6 10*3/uL (ref 4.0–10.5)

## 2016-07-03 LAB — TSH: TSH: 1.6 u[IU]/mL (ref 0.35–4.50)

## 2016-07-03 MED ORDER — TOPIRAMATE 50 MG PO TABS: 50.0000 mg | ORAL_TABLET | Freq: Two times a day (BID) | ORAL | 2 refills | Status: DC

## 2016-07-03 MED ORDER — PHENTERMINE HCL 37.5 MG PO TABS: ORAL_TABLET | ORAL | 2 refills | Status: DC

## 2016-07-03 NOTE — Telephone Encounter (Signed)
Prescription for Phentermine-Topiramate 7.5-46 MG CP24 is not covered by patients insurance, was informed it would be covered if prescribed separately.

## 2016-07-03 NOTE — Telephone Encounter (Signed)
Per Dr. Juleen China, ok to send in Topamax 50 mg BID and Phentermine 37.5 one-half tablet daily.  Prescriptions sent to patient's pharmacy.

## 2016-07-04 ENCOUNTER — Telehealth: Payer: Self-pay

## 2016-07-04 NOTE — Telephone Encounter (Signed)
Prior Auth for Phentermine was approved through 10/03/2015.  Patient currently picked up Qsymia from his pharmacy.  May not need individual Rx for phentermine.

## 2016-07-06 LAB — INSULIN, FREE (BIOACTIVE): Insulin, Free: 7.2 u[IU]/mL (ref 1.5–14.9)

## 2016-07-11 ENCOUNTER — Telehealth: Payer: Self-pay | Admitting: Family Medicine

## 2016-07-11 NOTE — Telephone Encounter (Signed)
Noted. I agree with the plan. Please call and find out if patient is still taking Qsymia, if so, please have him discontinue this medication. I will be happy to see him tomorrow. If symptoms worsen in the meantime, please have him go to the ER.

## 2016-07-11 NOTE — Telephone Encounter (Signed)
Patient scheduled for appointment with Inda Coke, PA on 07/12/2016 at 8:00 am.

## 2016-07-11 NOTE — Telephone Encounter (Signed)
Patient Name: Robert Gamble DOB: [birth year] Initial Comment Caller states he is having heart palpitations, recently made change to blood pressure and weight loss medications Nurse Assessment Nurse: Robert Sa, RN, Robert Gamble Date/Time (Eastern Time): 07/11/2016 10:47:15 AM Confirm and document reason for call. If symptomatic, describe symptoms. ---Caller states he developed heart palpitations about a week ago. He thinks they are related to new medications he started about a week ago. No severe breathing difficultly. No chest pain. Alert and responsive. Does the patient have any new or worsening symptoms? ---Yes Will a triage be completed? ---Yes Related visit to physician within the last 2 weeks? ---Yes Does the PT have any chronic conditions? (i.e. diabetes, asthma, etc.) ---Yes List chronic conditions. ---Diabetes, High Blood Pressure, Migraines Is this a behavioral health or substance abuse call? ---No Guidelines Guideline Title Affirmed Question Affirmed Notes Heart Rate and Heartbeat Questions [1] Palpitations AND [2] no improvement after following Care Advice Final Disposition User See PCP When Office is Open (within 3 days) [place], RN, Robert Gamble to See Within 24 Hours to due caller's concerns about his medications. Transferred to [facility] via the office phone line to check on appointment options. (Epic down) Referrals REFERRED TO PCP OFFICE Disagree/Comply: Comply

## 2016-07-11 NOTE — Telephone Encounter (Signed)
Spoke to patient, stated he was having heart palpitations, sent to [facility]. Stated he thought it was his phentermine or topiramate.

## 2016-07-11 NOTE — Telephone Encounter (Signed)
Waiting for [facility] note. Robert Gamble is aware of call.

## 2016-07-11 NOTE — Telephone Encounter (Signed)
Spoke with patient.  States he hasn't taken Qsymia since Saturday, 07/07/2016.  Advised him to continue to remain off Qsymia and go the ER if he has worsening of symptoms.  Patient verbalized understanding.

## 2016-07-12 ENCOUNTER — Ambulatory Visit (INDEPENDENT_AMBULATORY_CARE_PROVIDER_SITE_OTHER): Payer: BLUE CROSS/BLUE SHIELD | Admitting: Physician Assistant

## 2016-07-12 ENCOUNTER — Encounter: Payer: Self-pay | Admitting: Physician Assistant

## 2016-07-12 VITALS — BP 160/110 | HR 84 | Ht 69.0 in | Wt 256.0 lb

## 2016-07-12 DIAGNOSIS — I10 Essential (primary) hypertension: Secondary | ICD-10-CM | POA: Diagnosis not present

## 2016-07-12 DIAGNOSIS — R002 Palpitations: Secondary | ICD-10-CM

## 2016-07-12 MED ORDER — VERAPAMIL HCL 80 MG PO TABS: 80.0000 mg | ORAL_TABLET | Freq: Three times a day (TID) | ORAL | 0 refills | Status: DC

## 2016-07-12 NOTE — Progress Notes (Addendum)
Stevan Eberwein is a 41 y.o. male here for a new problem.  Autumn Leonides Schanz, cma is acting as a Education administrator for [employer], [place].  History of Present Illness:   Chief Complaint  Patient presents with  . Heart Palpatations    Mylon reports intermittent palpations, "feels like butterflies in my chest." Recent was placed on Valsartan-HCTZ and starter does of Qsymia 3.75-23. He was also told to stop taking his verapamil. He was taking verapamil 3 times a day and wanted to switch to something else that wasn't requiring him to take it 3 times daily. He had been stable on verapamil for about 2 years. He d/c taking Qsymia but has resumed BP medication with persistent symptoms. He has been checking his blood pressure at home and it has been around 160/90. Typically palpatations are triggered with rest. Episodes lasting seconds. Denies chest pain, SOB, weakness or numbness. Took Blood pressure medication this AM. MGF has hx of heart attacks.   Denies any excess caffeine intake or illegal drug use.  PMHx, SurgHx, SocialHx, Medications, and Allergies were reviewed in the Visit Navigator and updated as appropriate.  Current Medications:   Current Outpatient Prescriptions:  .  SUMAtriptan (IMITREX) 100 MG tablet, Take 100 mg by mouth as needed for migraine. May repeat in 2 hours if headache persists or recurs., Disp: , Rfl:  .  valsartan-hydrochlorothiazide (DIOVAN-HCT) 80-12.5 MG tablet, Take 1 tablet by mouth daily., Disp: 90 tablet, Rfl: 3 .  topiramate (TOPAMAX) 50 MG tablet, Take 1 tablet (50 mg total) by mouth 2 (two) times daily. (Patient not taking: Reported on 07/12/2016), Disp: 60 tablet, Rfl: 2 .  verapamil (CALAN) 80 MG tablet, Take 1 tablet (80 mg total) by mouth 3 (three) times daily., Disp: 90 tablet, Rfl: 0   Review of Systems:   Review of Systems  Constitutional: Positive for malaise/fatigue. Negative for chills, fever and weight loss.  HENT: Negative.   Eyes: Negative.    Respiratory: Negative.  Negative for cough and shortness of breath.   Cardiovascular: Positive for palpitations. Negative for chest pain, claudication, leg swelling and PND.  Gastrointestinal: Negative.   Genitourinary: Negative.   Musculoskeletal: Negative.  Negative for back pain, myalgias and neck pain.  Skin: Negative.   Neurological: Negative.   Endo/Heme/Allergies: Negative.   Psychiatric/Behavioral: Negative.     Vitals:   Vitals:   07/12/16 0806 07/12/16 0831  BP: (!) 190/110 (!) 160/110  Pulse: 84   SpO2: 97%   Weight: 256 lb (116.1 kg)   Height: 5\' 9"  (1.753 m)      Body mass index is 37.8 kg/m.  Physical Exam:   Physical Exam  Constitutional: He appears well-developed. He is cooperative.  Non-toxic appearance. He does not have a sickly appearance. He does not appear ill. No distress.  Cardiovascular: Normal rate, regular rhythm, S1 normal, S2 normal, normal heart sounds and normal pulses.   No LE edema  Pulmonary/Chest: Effort normal and breath sounds normal.  Neurological: He is alert.  Nursing note and vitals reviewed.   EKG tracing is personally reviewed.  EKG notes NSR.  No acute changes.   Assessment and Plan:    Kemarion was seen today for heart palpatations.  Diagnoses and all orders for this visit:  Palpitations -     EKG 12-Lead  Essential hypertension  Other orders -     verapamil (CALAN) 80 MG tablet; Take 1 tablet (80 mg total) by mouth 3 (three) times daily.   Discussed case  with Dr. Teresa Coombs. Patient's symptoms are likely secondary to phentermine as well as uncontrolled blood pressure. EKG tracing is personally reviewed.  EKG notes NSR.  No acute changes.  I'm going to have him continue his Diovan and start Verapamil 80 mg 3 times daily as this medication seemed to work well for him prior. I told him I would like for him to follow-up with Dr. Juleen China in about 2 weeks and keep a log of his blood pressures in the meantime. I asked him to  let us know if his symptoms change in any way. I advised him to continue to avoid all phentermine products until he sees Dr. Briscoe Deutscher. Patient was agreeable to plan.  . Reviewed expectations re: course of current medical issues. . Discussed self-management of symptoms. . Outlined signs and symptoms indicating need for more acute intervention. . Patient verbalized understanding and all questions were answered. . See orders for this visit as documented in the electronic medical record. . Patient received an After-Visit Summary.  CMA or LPN served as scribe during this visit. History, Physical, and Plan performed by medical provider. Documentation and orders reviewed and attested to.  Inda Coke, PA-C

## 2016-07-12 NOTE — Patient Instructions (Signed)
Stop all phentermine-containing products.  Continue the Losartan-HCTZ blood pressure medication.  Start 80mg  Verapamil three times daily.  Follow-up with Dr. Juleen China in 2 weeks.  Continue to check blood pressure and log daily so Dr. Juleen China can review at follow-up.  Any changes in symptoms --> SEEK MEDICAL ATTENTION

## 2016-07-27 ENCOUNTER — Encounter: Payer: Self-pay | Admitting: Family Medicine

## 2016-07-27 ENCOUNTER — Ambulatory Visit (INDEPENDENT_AMBULATORY_CARE_PROVIDER_SITE_OTHER): Payer: BLUE CROSS/BLUE SHIELD | Admitting: Family Medicine

## 2016-07-27 VITALS — BP 148/82 | HR 57 | Temp 97.8°F | Ht 69.0 in | Wt 258.4 lb

## 2016-07-27 DIAGNOSIS — G43909 Migraine, unspecified, not intractable, without status migrainosus: Secondary | ICD-10-CM | POA: Diagnosis not present

## 2016-07-27 DIAGNOSIS — G4730 Sleep apnea, unspecified: Secondary | ICD-10-CM

## 2016-07-27 DIAGNOSIS — I880 Nonspecific mesenteric lymphadenitis: Secondary | ICD-10-CM | POA: Diagnosis not present

## 2016-07-27 DIAGNOSIS — J301 Allergic rhinitis due to pollen: Secondary | ICD-10-CM

## 2016-07-27 DIAGNOSIS — D485 Neoplasm of uncertain behavior of skin: Secondary | ICD-10-CM

## 2016-07-27 DIAGNOSIS — G4710 Hypersomnia, unspecified: Secondary | ICD-10-CM

## 2016-07-27 DIAGNOSIS — E669 Obesity, unspecified: Secondary | ICD-10-CM

## 2016-07-27 DIAGNOSIS — I10 Essential (primary) hypertension: Secondary | ICD-10-CM

## 2016-07-27 LAB — BASIC METABOLIC PANEL
BUN: 18 mg/dL (ref 6–23)
CO2: 32 mEq/L (ref 19–32)
Calcium: 9.8 mg/dL (ref 8.4–10.5)
Chloride: 103 mEq/L (ref 96–112)
Creatinine, Ser: 0.87 mg/dL (ref 0.40–1.50)
GFR: 103.31 mL/min (ref >60.00)
Glucose, Bld: 92 mg/dL (ref 70–99)
Potassium: 4.4 mEq/L (ref 3.5–5.1)
Sodium: 140 mEq/L (ref 135–145)

## 2016-07-27 MED ORDER — HYDROCHLOROTHIAZIDE 12.5 MG PO CAPS: 12.5000 mg | ORAL_CAPSULE | Freq: Every day | ORAL | 2 refills | Status: DC

## 2016-07-27 MED ORDER — VERAPAMIL HCL ER 120 MG PO TBCR: 120.0000 mg | EXTENDED_RELEASE_TABLET | Freq: Every day | ORAL | 11 refills | Status: DC

## 2016-07-27 MED ORDER — SUMATRIPTAN SUCCINATE 100 MG PO TABS: 100.0000 mg | ORAL_TABLET | ORAL | 2 refills | Status: DC | PRN

## 2016-07-27 NOTE — Progress Notes (Signed)
Robert Gamble is a 41 y.o. male is here for follow up.  History of Present Illness:   Water quality scientist, CMA, acting as scribe for Dr. Juleen China.  HPI:  1. Hypertension.   Home blood pressure readings systolic 381O.  Avoiding excessive salt intake? [x]   YES  []   NO Trying to exercise on a regular basis? []   YES  [x]   NO Review: taking medications as instructed, no medication side effects noted, no TIAs, no chest pain on exertion, no dyspnea on exertion, no swelling of ankles.   Wt Readings from Last 3 Encounters:  07/27/16 258 lb 6.4 oz (117.2 kg)  07/12/16 256 lb (116.1 kg)  07/02/16 259 lb (117.5 kg)    reports that he has quit smoking. He has never used smokeless tobacco. BP Readings from Last 3 Encounters:  07/27/16 (!) 148/82  07/12/16 (!) 160/110  07/02/16 (!) 142/78   Lab Results  Component Value Date   CREATININE 0.84 07/03/2016    2. Migraine without status migrainosus. History of thorough workup in the past. He uses Imitrex sparingly. He does request a refill today. No new symptoms or concerns.    3. Obesity (BMI 30-39.9).Patient did not tolerate the previously prescribed Qsymia. Previous note reviewed at length and discussed with patient.    Health Maintenance Due  Topic Date Due  . PNEUMOCOCCAL POLYSACCHARIDE VACCINE (1) 08/08/1978  . URINE MICROALBUMIN  08/08/1986  . HIV Screening  08/08/1991  . FOOT EXAM  05/09/2011   PMHx, SurgHx, SocialHx, FamHx, Medications, and Allergies were reviewed in the Visit Navigator and updated as appropriate.   Patient Active Problem List   Diagnosis Date Noted  . Migraine without status migrainosus, not intractable 07/27/2016  . Obesity (BMI 30-39.9) 07/02/2016  . Type 2 diabetes mellitus without complication, without long-term current use of insulin ([place]) 07/02/2016  . Essential hypertension 04/12/2009  . Allergic rhinitis 03/11/2009  . 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 Smoking status: Former Research scientist (life sciences)  . Smokeless tobacco: Never Used  . Alcohol use No   Current Medications and Allergies:   .  SUMAtriptan (IMITREX) 100 MG tablet, Take 1 tablet (100 mg total) by mouth as needed for migraine. May repeat in 2 hours if headache persists or recurs., Disp: 10 tablet, Rfl: 2 .  verapamil (CALAN-SR) 120 MG CR tablet, Take 1 tablet (120 mg total) by mouth at bedtime., Disp: 90 tablet, Rfl: 11  Allergies  Allergen Reactions  . Pregabalin Shortness Of Breath  . Phentermine Palpitations  . Ramipril Cough  . Penicillins Rash   Review of Systems   Review of Systems  Constitutional: Negative for chills and fever.  HENT: Negative for congestion, ear pain and sore throat.   Eyes: Negative for blurred vision and pain.  Respiratory: Negative for cough and shortness of breath.   Cardiovascular: Negative for chest pain and palpitations.  Gastrointestinal: Negative for abdominal pain, nausea and vomiting.  Genitourinary: Negative for frequency.  Musculoskeletal: Negative for back pain and neck pain.  Skin: Negative for rash.  Neurological: Negative for dizziness, loss of consciousness, weakness and headaches.  Endo/Heme/Allergies: Negative for environmental allergies.  Psychiatric/Behavioral: Negative for depression. The patient is not nervous/anxious.     Vitals:   Vitals:   07/27/16 1026  BP: (!) 148/82  Pulse: (!) 57  Temp: 97.8 F (36.6 C)  TempSrc: Oral  SpO2: 97%  Weight: 258 lb 6.4 oz (117.2 kg)  Height:  5\' 9"  (1.753 m)     Body mass index is 38.16 kg/m.   Physical Exam:   Physical Exam  Constitutional: He is oriented to person, place, and time. He appears well-developed and well-nourished. No distress.  HENT:  Head: Normocephalic and atraumatic.  Right Ear: External ear normal.  Left Ear: External ear normal.  Nose: Nose normal.  Mouth/Throat: Oropharynx is clear and moist.  Eyes: Conjunctivae and EOM are normal. Pupils are equal,  round, and reactive to light.  Neck: Normal range of motion. Neck supple.  Cardiovascular: Normal rate, regular rhythm, normal heart sounds and intact distal pulses.   Pulmonary/Chest: Effort normal and breath sounds normal.  Abdominal: Soft. Bowel sounds are normal.  Musculoskeletal: Normal range of motion.  Neurological: He is alert and oriented to person, place, and time.  Skin: Skin is warm and dry.  Psychiatric: He has a normal mood and affect. His behavior is normal. Judgment and thought content normal.  Nursing note and vitals reviewed.    Results for orders placed or performed in visit on 07/03/16  CBC  Result Value Ref Range   WBC 5.6 4.0 - 10.5 K/uL   RBC 4.93 4.22 - 5.81 Mil/uL   Platelets 237.0 150.0 - 400.0 K/uL   Hemoglobin 14.7 13.0 - 17.0 g/dL   HCT 42.0 39.0 - 52.0 %   MCV 85.2 78.0 - 100.0 fl   MCHC 35.0 30.0 - 36.0 g/dL   RDW 13.1 11.5 - 15.5 %  Comprehensive metabolic panel  Result Value Ref Range   Sodium 140 135 - 145 mEq/L   Potassium 3.7 3.5 - 5.1 mEq/L   Chloride 104 96 - 112 mEq/L   CO2 28 19 - 32 mEq/L   Glucose, Bld 99 70 - 99 mg/dL   BUN 13 6 - 23 mg/dL   Creatinine, Ser 0.84 0.40 - 1.50 mg/dL   Total Bilirubin 0.6 0.2 - 1.2 mg/dL   Alkaline Phosphatase 54 39 - 117 U/L   AST 30 0 - 37 U/L   ALT 31 0 - 53 U/L   Total Protein 8.0 6.0 - 8.3 g/dL   Albumin 4.5 3.5 - 5.2 g/dL   Calcium 9.5 8.4 - 10.5 mg/dL   GFR 107.62 >60.00 mL/min  TSH  Result Value Ref Range   TSH 1.60 0.35 - 4.50 uIU/mL  Insulin, Free (Bioactive)  Result Value Ref Range   Insulin, Free 7.2 1.5 - 14.9 uIU/mL   Assessment and Plan:   Robert Gamble was seen today for follow-up.  Diagnoses and all orders for this visit:  Hypertension, unspecified type Comments: Continue current medication. I will add on HCTZ. The patient will continue to monitor blood pressure and heart rate. Red flags reviewed. Orders: -     Basic metabolic panel -     hydrochlorothiazide (MICROZIDE) 12.5 MG  capsule; Take 1 capsule (12.5 mg total) by mouth daily. -     verapamil (CALAN-SR) 120 MG CR tablet; Take 1 tablet (120 mg total) by mouth at bedtime.  Migraine without status migrainosus, not intractable, unspecified migraine type Comments: We reviewed the importance of hydration and monitoring symptoms. I will refill his Imitrex today. Orders: -     SUMAtriptan (IMITREX) 100 MG tablet; Take 1 tablet (100 mg total) by mouth as needed for migraine. May repeat in 2 hours if headache persists or recurs.  Obesity (BMI 30-39.9) Comments: The patient is asked to make an attempt to improve diet and exercise patterns to aid in medical  management of this problem.   . Reviewed expectations re: course of current medical issues. . Discussed self-management of symptoms. . Outlined signs and symptoms indicating need for more acute intervention. . Patient verbalized understanding and all questions were answered. Marland Kitchen Health Maintenance issues including appropriate healthy diet, exercise, and smoking avoidance were discussed with patient. . See orders for this visit as documented in the electronic medical record. . Patient received an After Visit Summary.  CMA served as Education administrator during this visit. History, Physical, and Plan performed by medical provider. The above documentation has been reviewed and is accurate and complete. Briscoe Deutscher, D.O.  Briscoe Deutscher, DO [place], [place] 07/27/2016  Future Appointments Date Time Provider [place]  10/30/2016 7:30 AM Briscoe Deutscher, DO [facility] None

## 2016-07-30 ENCOUNTER — Telehealth: Payer: Self-pay

## 2016-07-30 NOTE — Telephone Encounter (Signed)
Patient called and stated he received extended release verapamil after his last appointment.  Was previously taking immediate release 120 mg TID.  Dr. Juleen China is ok with patient trying extended release or switching back to regular release.  Patient states he will try the extended release.  If it is not helpful, he will call the office to switch back to immediate release TID.

## 2016-08-02 ENCOUNTER — Other Ambulatory Visit: Payer: Self-pay

## 2016-08-02 MED ORDER — VERAPAMIL HCL 120 MG PO TABS: 120.0000 mg | ORAL_TABLET | Freq: Three times a day (TID) | ORAL | 3 refills | Status: DC

## 2016-08-02 NOTE — Telephone Encounter (Signed)
Rx for immediate release verapamil 120 mg TID sent to patient's pharmacy.

## 2016-08-02 NOTE — Telephone Encounter (Signed)
Patient called to advise the extended release is not working for him. Patient would like to go back to the immediate release 120mg . Please call patient and advise on next steps for this process.

## 2016-10-01 ENCOUNTER — Ambulatory Visit (INDEPENDENT_AMBULATORY_CARE_PROVIDER_SITE_OTHER): Payer: BLUE CROSS/BLUE SHIELD | Admitting: Family Medicine

## 2016-10-01 ENCOUNTER — Encounter: Payer: Self-pay | Admitting: Family Medicine

## 2016-10-01 DIAGNOSIS — M25512 Pain in left shoulder: Secondary | ICD-10-CM | POA: Diagnosis not present

## 2016-10-01 MED ORDER — NITROGLYCERIN 0.2 MG/HR TD PT24: MEDICATED_PATCH | TRANSDERMAL | 1 refills | Status: DC

## 2016-10-01 NOTE — Patient Instructions (Signed)
You have rotator cuff impingement Try to avoid painful activities (overhead activities, lifting with extended arm) as much as possible. Aleve 2 tabs twice a day with food OR ibuprofen 3 tabs three times a day with food for pain and inflammation. Can take tylenol in addition to this. I would not recommend an injection with your acute injury. Consider physical therapy with transition to home exercise program. Do home exercise program with theraband and scapular stabilization exercises daily 3 sets of 10 once a day.  If not improving at follow-up we will consider further imaging, injection, physical therapy, and/or nitro patches. Follow up with me in 4-6 weeks.  Try the nitro patches for your achilles. It's 1/4th patch to affected achilles, change daily.

## 2016-10-02 ENCOUNTER — Ambulatory Visit: Payer: BLUE CROSS/BLUE SHIELD | Admitting: Family Medicine

## 2016-10-03 DIAGNOSIS — M25512 Pain in left shoulder: Secondary | ICD-10-CM | POA: Insufficient documentation

## 2016-10-03 NOTE — Assessment & Plan Note (Signed)
2/2 rotator cuff strain, impingement.  Exam otherwise reassuring.  Shown home exercises to do daily.  Aleve or ibuprofen.  F/u in 4-6 weeks.  Consider imaging, physical therapy if not improving as expected.

## 2016-10-03 NOTE — Progress Notes (Signed)
PCP: Briscoe Deutscher, DO  Subjective:   HPI: Patient is a 41 y.o. male here for left shoulder pain.  Patient reports on 7/23 he was moving furniture at church when weight of table pulled his shoulder backwards (hyperextended). Pain is now 0/10 at rest in left shoulder deep anteriorly but up to 5-6/10 at times and achy by end of day. No bruising or swelling. Has been doing motion exercises, icing, taking naproxen. Wakes him up if rolls onto left side. He is right handed. No skin changes, numbness.  Past Medical History:  Diagnosis Date  . Allergic rhinitis   . Chronic headache   . DM2 (diabetes mellitus, type 2) ([place])   . HTN (hypertension)   . OSA on CPAP     Current Outpatient Prescriptions on File Prior to Visit  Medication Sig Dispense Refill  . hydrochlorothiazide (MICROZIDE) 12.5 MG capsule Take 1 capsule (12.5 mg total) by mouth daily. 30 capsule 2  . SUMAtriptan (IMITREX) 100 MG tablet Take 1 tablet (100 mg total) by mouth as needed for migraine. May repeat in 2 hours if headache persists or recurs. 10 tablet 2  . verapamil (CALAN) 120 MG tablet Take 1 tablet (120 mg total) by mouth 3 (three) times daily. 90 tablet 3  . [DISCONTINUED] Phentermine-Topiramate 7.5-46 MG CP24 Take 1 capsule by mouth daily. (Patient not taking: Reported on 07/12/2016) 30 capsule 0   No current facility-administered medications on file prior to visit.     Past Surgical History:  Procedure Laterality Date  . APPENDECTOMY      Allergies  Allergen Reactions  . Pregabalin Shortness Of Breath  . Phentermine Palpitations  . Ramipril Cough  . Penicillins Rash    Social History   Social History  . Marital status: Married    Spouse name: N/A  . Number of children: N/A  . Years of education: N/A   Occupational History  . Not on file.   Social History Main Topics  . Smoking status: Former Research scientist (life sciences)  . Smokeless tobacco: Never Used  . Alcohol use No  . Drug use: No  . Sexual activity:  Yes   Other Topics Concern  . Not on file   Social History Narrative   Quit ETOH, Street drugs   Married, daughter   Patient is a current smoker, 1/2ppd   Quit ETOH, Street drugs          Family History  Problem Relation Age of Onset  . Hypertension Father   . Sleep apnea Sister   . Diabetes Sister   . Hyperlipidemia Sister   . Hypertension Sister   . Mental illness Sister   . Lung cancer Unknown   . Clotting disorder Unknown   . Emphysema Unknown   . Heart disease Unknown   . Cancer Maternal Grandmother   . Cancer Maternal Grandfather     BP (!) 156/101   Pulse (!) 51   Ht 5\' 9"  (1.753 m)   Wt 250 lb (113.4 kg)   BMI 36.92 kg/m   Review of Systems: See HPI above.     Objective:  Physical Exam:  Gen: NAD, comfortable in exam room  Left shoulder: No swelling, ecchymoses.  No gross deformity. No TTP AC joint, biceps tendon. FROM with painful arc. Positive Hawkins, Neers. Negative Yergasons. Strength 5/5 with empty can and resisted internal/external rotation.  Pain with empty can > ER. Negative apprehension. NV intact distally.  Right shoulder: FROM without pain.   Assessment & Plan:  1. Left shoulder pain - 2/2 rotator cuff strain, impingement.  Exam otherwise reassuring.  Shown home exercises to do daily.  Aleve or ibuprofen.  F/u in 4-6 weeks.  Consider imaging, physical therapy if not improving as expected.  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

## 2016-10-23 ENCOUNTER — Other Ambulatory Visit: Payer: Self-pay | Admitting: Family Medicine

## 2016-10-23 DIAGNOSIS — I10 Essential (primary) hypertension: Secondary | ICD-10-CM

## 2016-10-30 ENCOUNTER — Encounter: Payer: Self-pay | Admitting: Family Medicine

## 2016-10-30 ENCOUNTER — Ambulatory Visit (INDEPENDENT_AMBULATORY_CARE_PROVIDER_SITE_OTHER): Payer: BLUE CROSS/BLUE SHIELD | Admitting: Family Medicine

## 2016-10-30 VITALS — BP 142/80 | HR 65 | Temp 97.4°F | Ht 69.0 in | Wt 258.6 lb

## 2016-10-30 DIAGNOSIS — I10 Essential (primary) hypertension: Secondary | ICD-10-CM

## 2016-10-30 DIAGNOSIS — Z13220 Encounter for screening for lipoid disorders: Secondary | ICD-10-CM | POA: Diagnosis not present

## 2016-10-30 DIAGNOSIS — G4710 Hypersomnia, unspecified: Secondary | ICD-10-CM | POA: Diagnosis not present

## 2016-10-30 DIAGNOSIS — E782 Mixed hyperlipidemia: Secondary | ICD-10-CM

## 2016-10-30 DIAGNOSIS — E119 Type 2 diabetes mellitus without complications: Secondary | ICD-10-CM | POA: Diagnosis not present

## 2016-10-30 DIAGNOSIS — G4730 Sleep apnea, unspecified: Secondary | ICD-10-CM

## 2016-10-30 DIAGNOSIS — E669 Obesity, unspecified: Secondary | ICD-10-CM | POA: Diagnosis not present

## 2016-10-30 LAB — MICROALBUMIN / CREATININE URINE RATIO
Creatinine,U: 57.5 mg/dL
Microalb Creat Ratio: 2.3 mg/g (ref 0.0–30.0)
Microalb, Ur: 1.3 mg/dL (ref 0.0–1.9)

## 2016-10-30 LAB — COMPREHENSIVE METABOLIC PANEL
ALT: 24 U/L (ref 0–53)
AST: 23 U/L (ref 0–37)
Albumin: 4.9 g/dL (ref 3.5–5.2)
Alkaline Phosphatase: 59 U/L (ref 39–117)
BUN: 11 mg/dL (ref 6–23)
CO2: 29 mEq/L (ref 19–32)
Calcium: 10 mg/dL (ref 8.4–10.5)
Chloride: 102 mEq/L (ref 96–112)
Creatinine, Ser: 0.8 mg/dL (ref 0.40–1.50)
GFR: 113.66 mL/min (ref >60.00)
Glucose, Bld: 103 mg/dL — ABNORMAL HIGH (ref 70–99)
Potassium: 3.4 mEq/L — ABNORMAL LOW (ref 3.5–5.1)
Sodium: 140 mEq/L (ref 135–145)
Total Bilirubin: 0.5 mg/dL (ref 0.2–1.2)
Total Protein: 8.3 g/dL (ref 6.0–8.3)

## 2016-10-30 LAB — LDL CHOLESTEROL, DIRECT: Direct LDL: 127 mg/dL

## 2016-10-30 LAB — LIPID PANEL
Cholesterol: 201 mg/dL — ABNORMAL HIGH (ref 0–200)
HDL: 41.5 mg/dL (ref >39.00)
NonHDL: 159.65
Total CHOL/HDL Ratio: 5
Triglycerides: 250 mg/dL — ABNORMAL HIGH (ref 0.0–149.0)
VLDL: 50 mg/dL — ABNORMAL HIGH (ref 0.0–40.0)

## 2016-10-30 LAB — HEMOGLOBIN A1C: Hgb A1c MFr Bld: 5.8 % (ref 4.6–6.5)

## 2016-10-30 MED ORDER — METFORMIN HCL 500 MG PO TABS: 500.0000 mg | ORAL_TABLET | Freq: Two times a day (BID) | ORAL | 3 refills | Status: DC

## 2016-10-30 NOTE — Progress Notes (Signed)
Robert Gamble is a 41 y.o. male is here for follow up.  History of Present Illness:   Water quality scientist, CMA, acting as scribe for Dr. Juleen China.  HPI:  Hypertension Home blood pressure readings 150s/90s range.  Avoiding excessive salt intake? [x]   YES  []   NO Trying to exercise on a regular basis? []   YES  [x]   NO Review: taking medications as instructed, no medication side effects noted, no TIAs, no chest pain on exertion, no dyspnea on exertion, no swelling of ankles.   Wt Readings from Last 3 Encounters:  10/30/16 258 lb 9.6 oz (117.3 kg)  10/01/16 250 lb (113.4 kg)  07/27/16 258 lb 6.4 oz (117.2 kg)   BP Readings from Last 3 Encounters:  10/30/16 (!) 142/80  10/01/16 (!) 156/101  07/27/16 (!) 148/82   Lab Results  Component Value Date   CREATININE 0.87 07/27/2016   Diabetes Current symptoms: no polyuria or polydipsia, no chest pain, dyspnea or TIA's, no numbness, tingling or pain in extremities.  Maintaining a diabetic diet? []   YES  [x]   NO Trying to exercise on a regular basis? []   YES  [x]   NO  On ACE inhibitor or angiotensin II receptor blocker? []   YES  [x]   NO On Aspirin? []   YES  [x]   NO  Lab Results  Component Value Date   HGBA1C 5.3 07/02/2016    No results found for: MICROALBUR, [id number]   Obstructive sleep apnea. Patent has a history of known OSA, but has not been using a CPAP for years - since weight loss > 100 pounds. Patient generally gets approximately 8 hours of sleep per night, and states they generally have generally restful sleep. Snoring of mild severity is present. Apneic episodes are not present. Nasal obstruction is present but states that it is made better using nasal strips.   Health Maintenance Due  Topic Date Due  . PNEUMOCOCCAL POLYSACCHARIDE VACCINE (1) 08/08/1978  . URINE MICROALBUMIN  08/08/1986  . HIV Screening  08/08/1991  . FOOT EXAM  05/09/2011  . OPHTHALMOLOGY EXAM  07/31/2016  . INFLUENZA VACCINE  09/26/2016    Depression screen [facility] 2/9 10/30/2016 07/02/2016  Decreased Interest 0 0  Down, Depressed, Hopeless 0 0  PHQ - 2 Score 0 0   PMHx, SurgHx, SocialHx, FamHx, Medications, and Allergies were reviewed in the Visit Navigator and updated as appropriate.   Patient Active Problem List   Diagnosis Date Noted  . Left shoulder pain 10/03/2016  . Migraine without status migrainosus, not intractable 07/27/2016  . Obesity (BMI 30-39.9) 07/02/2016  . Type 2 diabetes mellitus without complication, without long-term current use of insulin ([place]) 07/02/2016  . Essential hypertension 04/12/2009  . Allergic rhinitis 03/11/2009  . Hypersomnia with sleep apnea 03/04/2009   Social History  Substance Use Topics  . Smoking status: Former Research scientist (life sciences)  . Smokeless tobacco: Never Used  . Alcohol use No   Current Medications and Allergies:   .  hydrochlorothiazide (MICROZIDE) 12.5 MG capsule, TAKE 1 CAPSULE BY MOUTH EVERY DAY, Disp: 30 capsule, Rfl: 2 .  naproxen (NAPROSYN) 500 MG tablet, , Disp: , Rfl:  .  nitroGLYCERIN (NITRODUR - DOSED IN MG/24 HR) 0.2 mg/hr patch, Apply 1/4th patch to affected Achilles, change daily, Disp: 30 patch, Rfl: 1 .  SUMAtriptan (IMITREX) 100 MG tablet, Take 1 tablet (100 mg total) by mouth as needed for migraine. May repeat in 2 hours if headache persists or recurs., Disp: 10 tablet, Rfl: 2 .  verapamil (CALAN) 120 MG tablet, Take 1 tablet (120 mg total) by mouth 3 (three) times daily., Disp: 90 tablet, Rfl: 3  Allergies  Allergen Reactions  . Pregabalin Shortness Of Breath  . Phentermine Palpitations  . Ramipril Cough  . Penicillins Rash   Review of Systems   Pertinent items are noted in the HPI. Otherwise, ROS is negative.  Vitals:   Vitals:   10/30/16 0734  BP: (!) 142/80  Pulse: 65  Temp: (!) 97.4 F (36.3 C)  TempSrc: Oral  SpO2: 96%  Weight: 258 lb 9.6 oz (117.3 kg)  Height: 5\' 9"  (1.753 m)     Body mass index is 38.19 kg/m. Physical Exam:   Physical Exam   Constitutional: He is oriented to person, place, and time. He appears well-developed and well-nourished. No distress.  HENT:  Head: Normocephalic and atraumatic.  Right Ear: External ear normal.  Left Ear: External ear normal.  Nose: Nose normal.  Mouth/Throat: Oropharynx is clear and moist.  Eyes: Pupils are equal, round, and reactive to light. Conjunctivae and EOM are normal.  Neck: Normal range of motion. Neck supple.  Cardiovascular: Normal rate, regular rhythm, normal heart sounds and intact distal pulses.   Pulmonary/Chest: Effort normal and breath sounds normal.  Abdominal: Soft. Bowel sounds are normal.  Neurological: He is alert and oriented to person, place, and time.  Skin: Skin is warm and dry.  Psychiatric: He has a normal mood and affect. His behavior is normal. Judgment and thought content normal.  Nursing note and vitals reviewed.   Diabetic Foot Exam - Simple   Simple Foot Form Visual Inspection No deformities, no ulcerations, no other skin breakdown bilaterally:  Yes Sensation Testing Intact to touch and monofilament testing bilaterally:  Yes Pulse Check Posterior Tibialis and Dorsalis pulse intact bilaterally:  Yes Comments    Assessment and Plan:   Robert Gamble was seen today for follow-up.  Diagnoses and all orders for this visit:  Type 2 diabetes mellitus without complication, without long-term current use of insulin ([place]) Comments: Health maintenance updates today. Orders: -     Comprehensive metabolic panel -     Hemoglobin A1c -     Microalbumin / creatinine urine ratio -     metFORMIN (GLUCOPHAGE) 500 MG tablet; Take 1 tablet (500 mg total) by mouth 2 (two) times daily with a meal.  Essential hypertension Comments: Tolerating medication and low dose Nitro patch for achilles pathology.  Hypersomnia with sleep apnea Comments: Patient feels that this is controlled for now without CPAP, since weight loss. Not interested in new sleep study at this  time.  Obesity (BMI 30-39.9) Comments: Will restart Metformin. Discussed Victoza. Reviewed low carbohydrate diet and regular exercise.  Orders: -     metFORMIN (GLUCOPHAGE) 500 MG tablet; Take 1 tablet (500 mg total) by mouth 2 (two) times daily with a meal.  Screening for lipid disorders Comments: Patient is fasting today. Orders: -     Lipid panel   . Reviewed expectations re: course of current medical issues. . Discussed self-management of symptoms. . Outlined signs and symptoms indicating need for more acute intervention. . Patient verbalized understanding and all questions were answered. Marland Kitchen Health Maintenance issues including appropriate healthy diet, exercise, and smoking avoidance were discussed with patient. . See orders for this visit as documented in the electronic medical record. . Patient received an After Visit Summary.  CMA served as Education administrator during this visit. History, Physical, and Plan performed by medical provider. The above  documentation has been reviewed and is accurate and complete. Briscoe Deutscher, D.O.  Briscoe Deutscher, DO [place] [place] 10/30/2016  Future Appointments Date Time Provider [place]  11/05/2016 11:30 AM [place], Sharyn Lull, MD [facility]

## 2016-10-30 NOTE — Patient Instructions (Signed)
Check out the medication VICTOZA which is also known as [place] and tell me what you think at the next visit.

## 2016-11-05 ENCOUNTER — Encounter: Payer: Self-pay | Admitting: Family Medicine

## 2016-11-05 ENCOUNTER — Ambulatory Visit (INDEPENDENT_AMBULATORY_CARE_PROVIDER_SITE_OTHER): Payer: BLUE CROSS/BLUE SHIELD | Admitting: Family Medicine

## 2016-11-05 DIAGNOSIS — M25512 Pain in left shoulder: Secondary | ICD-10-CM | POA: Diagnosis not present

## 2016-11-05 NOTE — Patient Instructions (Signed)
Continue the home exercises most days of the week for 4-6 weeks. Ibuprofen or aleve only if needed. Use nitro patches for 2 more months then can stop these if you're doing well. Continue the calf raise exercises for this though through that time. Follow up with me as needed. As we discussed start at 50% of your usual weight when lifting at the gym and increase by 10% per week.

## 2016-11-05 NOTE — Progress Notes (Signed)
PCP: Briscoe Deutscher, DO  Subjective:   HPI: Patient is a 41 y.o. male here for left shoulder pain.  8/6: Patient reports on 7/23 he was moving furniture at church when weight of table pulled his shoulder backwards (hyperextended). Pain is now 0/10 at rest in left shoulder deep anteriorly but up to 5-6/10 at times and achy by end of day. No bruising or swelling. Has been doing motion exercises, icing, taking naproxen. Wakes him up if rolls onto left side. He is right handed. No skin changes, numbness.  9/10: Patient reports he is much better. Pain level 0/10 - can get a sharp pain with sudden movements though it lasts seconds. Not needing any medicines. Doing home exercises. Motion has improved. No skin changes, numbness. He also noticed he's had improvement in achilles pain with nitro patches.  Past Medical History:  Diagnosis Date  . Allergic rhinitis   . Chronic headache   . DM2 (diabetes mellitus, type 2) ([place])   . HTN (hypertension)   . OSA on CPAP     Current Outpatient Prescriptions on File Prior to Visit  Medication Sig Dispense Refill  . hydrochlorothiazide (MICROZIDE) 12.5 MG capsule TAKE 1 CAPSULE BY MOUTH EVERY DAY 30 capsule 2  . metFORMIN (GLUCOPHAGE) 500 MG tablet Take 1 tablet (500 mg total) by mouth 2 (two) times daily with a meal. 180 tablet 3  . naproxen (NAPROSYN) 500 MG tablet     . nitroGLYCERIN (NITRODUR - DOSED IN MG/24 HR) 0.2 mg/hr patch Apply 1/4th patch to affected Achilles, change daily 30 patch 1  . SUMAtriptan (IMITREX) 100 MG tablet Take 1 tablet (100 mg total) by mouth as needed for migraine. May repeat in 2 hours if headache persists or recurs. 10 tablet 2  . verapamil (CALAN) 120 MG tablet Take 1 tablet (120 mg total) by mouth 3 (three) times daily. 90 tablet 3  . [DISCONTINUED] Phentermine-Topiramate 7.5-46 MG CP24 Take 1 capsule by mouth daily. (Patient not taking: Reported on 07/12/2016) 30 capsule 0   No current facility-administered  medications on file prior to visit.     Past Surgical History:  Procedure Laterality Date  . APPENDECTOMY      Allergies  Allergen Reactions  . Pregabalin Shortness Of Breath  . Phentermine Palpitations  . Ramipril Cough  . Penicillins Rash    Social History   Social History  . Marital status: Married    Spouse name: N/A  . Number of children: N/A  . Years of education: N/A   Occupational History  . Not on file.   Social History Main Topics  . Smoking status: Former Research scientist (life sciences)  . Smokeless tobacco: Never Used  . Alcohol use No  . Drug use: No  . Sexual activity: Yes    Birth control/ protection: Rhythm   Other Topics Concern  . Not on file   Social History Narrative   Quit ETOH, Street drugs   Married, daughter   Patient is a current smoker, 1/2ppd   Quit ETOH, Street drugs          Family History  Problem Relation Age of Onset  . Hypertension Father   . Sleep apnea Sister   . Diabetes Sister   . Hyperlipidemia Sister   . Hypertension Sister   . Mental illness Sister   . Lung cancer Unknown   . Clotting disorder Unknown   . Emphysema Unknown   . Heart disease Unknown   . Cancer Maternal Grandmother   . Cancer  Maternal Grandfather     BP (!) 151/87   Pulse 60   Ht 5\' 9"  (1.753 m)   Wt 250 lb (113.4 kg)   BMI 36.92 kg/m   Review of Systems: See HPI above.     Objective:  Physical Exam:  Gen: NAD, comfortable in exam room  Left shoulder: No swelling, ecchymoses.  No gross deformity. No TTP. FROM without painful arc. Negative Hawkins, Neers. Negative Yergasons. Strength 5/5 with empty can and resisted internal/external rotation. Negative apprehension. NV intact distally.  Right shoulder: FROM without pain.  Assessment & Plan:  1. Left shoulder pain - 2/2 rotator cuff strain, impingement.  Much improved with home exercises.  Continue these for 4-6 more weeks.  Reviewed return to working out protocol as well.  F/u prn.  Aleve or  ibuprofen only if needed.  For his several year history of achilles tendinopathy will continue the nitro patches for total of 3 months.

## 2016-11-05 NOTE — Assessment & Plan Note (Signed)
2/2 rotator cuff strain, impingement.  Much improved with home exercises.  Continue these for 4-6 more weeks.  Reviewed return to working out protocol as well.  F/u prn.  Aleve or ibuprofen only if needed.

## 2016-11-06 ENCOUNTER — Telehealth: Payer: Self-pay | Admitting: Family Medicine

## 2016-11-06 MED ORDER — SIMVASTATIN 20 MG PO TABS: 20.0000 mg | ORAL_TABLET | Freq: Every evening | ORAL | 3 refills | Status: DC

## 2016-11-06 NOTE — Telephone Encounter (Signed)
Okay to refill? 

## 2016-11-06 NOTE — Addendum Note (Signed)
Addended by: Briscoe Deutscher R on: 11/06/2016 11:40 AM   Modules accepted: Orders

## 2016-11-06 NOTE — Telephone Encounter (Signed)
Pharmacy called in reference to Rx for simvastatin (ZOCOR) 20 MG tablet for patient. Pharmacy stated it interacts with verapamil (CALAN) 120 MG tablet. Pharmacy wanted to make sure Dr. Juleen China was aware of this. Please call pharmacy and advise.

## 2016-11-06 NOTE — Telephone Encounter (Signed)
Dr [place] please see message and advise. 

## 2016-11-07 NOTE — Telephone Encounter (Signed)
Pharmacy is aware

## 2016-11-23 ENCOUNTER — Other Ambulatory Visit: Payer: Self-pay | Admitting: Family Medicine

## 2016-12-12 DIAGNOSIS — S0993XA Unspecified injury of face, initial encounter: Secondary | ICD-10-CM | POA: Diagnosis not present

## 2016-12-12 DIAGNOSIS — S0181XA Laceration without foreign body of other part of head, initial encounter: Secondary | ICD-10-CM | POA: Diagnosis not present

## 2016-12-19 DIAGNOSIS — S0181XD Laceration without foreign body of other part of head, subsequent encounter: Secondary | ICD-10-CM | POA: Diagnosis not present

## 2016-12-19 DIAGNOSIS — S0993XD Unspecified injury of face, subsequent encounter: Secondary | ICD-10-CM | POA: Diagnosis not present

## 2017-01-07 ENCOUNTER — Other Ambulatory Visit: Payer: Self-pay | Admitting: Family Medicine

## 2017-01-07 DIAGNOSIS — I10 Essential (primary) hypertension: Secondary | ICD-10-CM

## 2017-01-29 ENCOUNTER — Ambulatory Visit: Payer: BLUE CROSS/BLUE SHIELD | Admitting: Family Medicine

## 2017-02-11 ENCOUNTER — Encounter: Payer: Self-pay | Admitting: Family Medicine

## 2017-02-11 ENCOUNTER — Ambulatory Visit (INDEPENDENT_AMBULATORY_CARE_PROVIDER_SITE_OTHER): Payer: BLUE CROSS/BLUE SHIELD | Admitting: Family Medicine

## 2017-02-11 VITALS — BP 144/82 | HR 79 | Temp 98.7°F | Wt 263.2 lb

## 2017-02-11 DIAGNOSIS — F329 Major depressive disorder, single episode, unspecified: Secondary | ICD-10-CM

## 2017-02-11 DIAGNOSIS — E782 Mixed hyperlipidemia: Secondary | ICD-10-CM | POA: Diagnosis not present

## 2017-02-11 DIAGNOSIS — I10 Essential (primary) hypertension: Secondary | ICD-10-CM | POA: Diagnosis not present

## 2017-02-11 DIAGNOSIS — E119 Type 2 diabetes mellitus without complications: Secondary | ICD-10-CM | POA: Diagnosis not present

## 2017-02-11 DIAGNOSIS — Z79899 Other long term (current) drug therapy: Secondary | ICD-10-CM

## 2017-02-11 LAB — COMPREHENSIVE METABOLIC PANEL
ALT: 23 U/L (ref 0–53)
AST: 19 U/L (ref 0–37)
Albumin: 4.6 g/dL (ref 3.5–5.2)
Alkaline Phosphatase: 59 U/L (ref 39–117)
BUN: 11 mg/dL (ref 6–23)
CO2: 32 mEq/L (ref 19–32)
Calcium: 9.1 mg/dL (ref 8.4–10.5)
Chloride: 103 mEq/L (ref 96–112)
Creatinine, Ser: 0.75 mg/dL (ref 0.40–1.50)
GFR: 122.28 mL/min (ref >60.00)
Glucose, Bld: 101 mg/dL — ABNORMAL HIGH (ref 70–99)
Potassium: 4 mEq/L (ref 3.5–5.1)
Sodium: 143 mEq/L (ref 135–145)
Total Bilirubin: 0.4 mg/dL (ref 0.2–1.2)
Total Protein: 8.1 g/dL (ref 6.0–8.3)

## 2017-02-11 LAB — LIPID PANEL
Cholesterol: 150 mg/dL (ref 0–200)
HDL: 47.3 mg/dL (ref >39.00)
LDL Cholesterol: 73 mg/dL (ref 0–99)
NonHDL: 102.98
Total CHOL/HDL Ratio: 3
Triglycerides: 149 mg/dL (ref 0.0–149.0)
VLDL: 29.8 mg/dL (ref 0.0–40.0)

## 2017-02-11 LAB — HEMOGLOBIN A1C: Hgb A1c MFr Bld: 5.9 % (ref 4.6–6.5)

## 2017-02-11 LAB — VITAMIN B12: Vitamin B-12: 606 pg/mL (ref 211–911)

## 2017-02-11 MED ORDER — BUPROPION HCL ER (XL) 150 MG PO TB24: 150.0000 mg | ORAL_TABLET | Freq: Every day | ORAL | 3 refills | Status: DC

## 2017-02-11 MED ORDER — LOSARTAN POTASSIUM-HCTZ 50-12.5 MG PO TABS: 1.0000 | ORAL_TABLET | Freq: Every day | ORAL | 3 refills | Status: DC

## 2017-02-11 NOTE — Progress Notes (Signed)
Robert Gamble is a 41 y.o. male is here for follow up.  History of Present Illness:   HPI: See Assessment and Plan section for Problem Based Charting of issues discussed today.  There are no preventive care reminders to display for this patient.   Depression screen [facility] 2/9 10/30/2016 07/02/2016  Decreased Interest 0 0  Down, Depressed, Hopeless 0 0  PHQ - 2 Score 0 0   PMHx, SurgHx, SocialHx, FamHx, Medications, and Allergies were reviewed in the Visit Navigator and updated as appropriate.   Patient Active Problem List   Diagnosis Date Noted  . Left shoulder pain 10/03/2016  . Migraine without status migrainosus, not intractable 07/27/2016  . Obesity (BMI 30-39.9) 07/02/2016  . Type 2 diabetes mellitus without complication, without long-term current use of insulin ([place]) 07/02/2016  . Essential hypertension 04/12/2009  . Allergic rhinitis 03/11/2009  . Hypersomnia with sleep apnea 03/04/2009   Social History   Tobacco Use  . Smoking status: Former Research scientist (life sciences)  . Smokeless tobacco: Never Used  Substance Use Topics  . Alcohol use: No  . Drug use: No   Current Medications and Allergies:   .  hydrochlorothiazide (MICROZIDE) 12.5 MG capsule, TAKE 1 CAPSULE BY MOUTH EVERY DAY, Disp: 30 capsule, Rfl: 2 .  metFORMIN (GLUCOPHAGE) 500 MG tablet, Take 1 tablet (500 mg total) by mouth 2 (two) times daily with a meal., Disp: 180 tablet, Rfl: 3 .  naproxen (NAPROSYN) 500 MG tablet, , Disp: , Rfl:  .  nitroGLYCERIN (NITRODUR - DOSED IN MG/24 HR) 0.2 mg/hr patch, Apply 1/4th patch to affected Achilles, change daily, Disp: 30 patch, Rfl: 1 .  simvastatin (ZOCOR) 20 MG tablet, Take 1 tablet (20 mg total) by mouth every evening., Disp: 90 tablet, Rfl: 3 .  SUMAtriptan (IMITREX) 100 MG tablet, Take 1 tablet (100 mg total) by mouth as needed for migraine. May repeat in 2 hours if headache persists or recurs., Disp: 10 tablet, Rfl: 2 .  verapamil (CALAN) 120 MG tablet, TAKE 1 TABLET (120 MG  TOTAL) BY MOUTH 3 (THREE) TIMES DAILY., Disp: 90 tablet, Rfl: 3  Allergies  Allergen Reactions  . Pregabalin Shortness Of Breath  . Phentermine Palpitations  . Ramipril Cough  . Penicillins Rash   Review of Systems   Pertinent items are noted in the HPI. Otherwise, ROS is negative.  Vitals:   Vitals:   02/11/17 0740  BP: (!) 144/82  Pulse: 79  Temp: 98.7 F (37.1 C)  TempSrc: Oral  SpO2: 96%  Weight: 263 lb 3.2 oz (119.4 kg)     Body mass index is 38.87 kg/m.   Physical Exam:   Physical Exam  Constitutional: He is oriented to person, place, and time. He appears well-developed and well-nourished. No distress.  HENT:  Head: Normocephalic and atraumatic.  Right Ear: External ear normal.  Left Ear: External ear normal.  Nose: Nose normal.  Mouth/Throat: Oropharynx is clear and moist.  Eyes: Conjunctivae and EOM are normal. Pupils are equal, round, and reactive to light.  Neck: Normal range of motion. Neck supple.  Cardiovascular: Normal rate, regular rhythm, normal heart sounds and intact distal pulses.  Pulmonary/Chest: Effort normal and breath sounds normal.  Abdominal: Soft. Bowel sounds are normal.  Musculoskeletal: Normal range of motion.  Neurological: He is alert and oriented to person, place, and time.  Skin: Skin is warm and dry.  Psychiatric: He has a normal mood and affect. His behavior is normal. Judgment and thought content normal.  Nursing note and vitals reviewed.   Assessment and Plan:   Robert Gamble was seen today for follow-up.  Diagnoses and all orders for this visit:  Type 2 diabetes mellitus without complication, without long-term current use of insulin (HCC) Comments: Current symptoms: no polyuria or polydipsia, no chest pain, dyspnea or TIA's, no numbness, tingling or pain in extremities.  Taking medication compliantly without noted sided effects [x]   YES  []   NO  Maintaining a diabetic diet? []   YES  [x]   NO Trying to exercise on a regular  basis? []   YES  [x]   NO  On ACE inhibitor or angiotensin II receptor blocker? []   YES  [x]   NO On Aspirin? []   YES  [x]   NO  Lab Results  Component Value Date   HGBA1C 5.8 10/30/2016    Lab Results  Component Value Date   MICROALBUR 1.3 10/30/2016    Lab Results  Component Value Date   CHOL 201 (H) 10/30/2016   HDL 41.50 10/30/2016   LDLCALC 102 (H) 05/09/2010   LDLDIRECT 127.0 10/30/2016   TRIG 250.0 (H) 10/30/2016   CHOLHDL 5 10/30/2016     Wt Readings from Last 3 Encounters:  02/11/17 263 lb 3.2 oz (119.4 kg)  11/05/16 250 lb (113.4 kg)  10/30/16 258 lb 9.6 oz (117.3 kg)   BP Readings from Last 3 Encounters:  02/11/17 (!) 144/82  11/05/16 (!) 151/87  10/30/16 (!) 142/80   Lab Results  Component Value Date   CREATININE 0.80 10/30/2016   Orders: -     Comprehensive metabolic panel -     Hemoglobin A1c  Mixed hyperlipidemia Comments: Is the patient taking medications without problems? [x]   YES  []   NO Does the patient complain of muscle aches?   []   YES  [x]    NO Trying to exercise on a regular basis? []   YES  [x]   NO Diet Compliance: noncompliant much of the time. Concerns: emotional eating. Cardiovascular ROS: no chest pain or dyspnea on exertion.   Lipids:    Component Value Date/Time   CHOL 201 (H) 10/30/2016 0757   TRIG 250.0 (H) 10/30/2016 0757   HDL 41.50 10/30/2016 0757   LDLDIRECT 127.0 10/30/2016 0757   VLDL 50.0 (H) 10/30/2016 0757   CHOLHDL 5 10/30/2016 0757   Orders: -     Lipid panel  Essential hypertension Comments: Home blood pressure readings elevated.  Avoiding excessive salt intake? []   YES  [x]   NO Trying to exercise on a regular basis? []   YES  [x]   NO Review: taking medications as instructed, no medication side effects noted, no TIAs, no chest pain on exertion, no dyspnea on exertion, no swelling of ankles.   Wt Readings from Last 3 Encounters:  02/11/17 263 lb 3.2 oz (119.4 kg)  11/05/16 250 lb (113.4 kg)  10/30/16 258 lb  9.6 oz (117.3 kg)   BP Readings from Last 3 Encounters:  02/11/17 (!) 144/82  11/05/16 (!) 151/87  10/30/16 (!) 142/80   Lab Results  Component Value Date   CREATININE 0.80 10/30/2016   Orders: -     losartan-hydrochlorothiazide (HYZAAR) 50-12.5 MG tablet; Take 1 tablet by mouth daily.  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 Hates winter holidays due to poor childhood memories.   Reactive  depression Comments: Depression symptoms: depressed mood, change in appetite or weight, loss of energy Current psychosocial stressors include: holidays, shorter days, wife with health issues.   Treatment to date has included None.  Symptoms have been gradually worsening since onset of treatment.  Alcohol use: no.  Drug use: no.     Patient denies current suicidal and homicidal ideation.  Orders: -     buPROPion (WELLBUTRIN XL) 150 MG 24 hr tablet; Take 1 tablet (150 mg total) by mouth daily.  Medication management -     Vitamin B12  . Reviewed expectations re: course of current medical issues. . Discussed self-management of symptoms. . Outlined signs and symptoms indicating need for more acute intervention. . Patient verbalized understanding and all questions were answered. Marland Kitchen Health Maintenance issues including appropriate healthy diet, exercise, and smoking avoidance were discussed with patient. . See orders for this visit as documented in the electronic medical record. . Patient received an After Visit Summary.  Briscoe Deutscher, DO [place] 02/11/2017  No future appointments.

## 2017-04-08 ENCOUNTER — Other Ambulatory Visit: Payer: Self-pay | Admitting: Family Medicine

## 2017-04-08 DIAGNOSIS — I10 Essential (primary) hypertension: Secondary | ICD-10-CM

## 2017-05-07 ENCOUNTER — Encounter: Payer: Self-pay | Admitting: Family Medicine

## 2017-05-07 ENCOUNTER — Ambulatory Visit (INDEPENDENT_AMBULATORY_CARE_PROVIDER_SITE_OTHER): Payer: BLUE CROSS/BLUE SHIELD | Admitting: Family Medicine

## 2017-05-07 DIAGNOSIS — E669 Obesity, unspecified: Secondary | ICD-10-CM | POA: Diagnosis not present

## 2017-05-07 DIAGNOSIS — E119 Type 2 diabetes mellitus without complications: Secondary | ICD-10-CM

## 2017-05-07 NOTE — Progress Notes (Signed)
Medical Nutrition Therapy:  Appt start time: 1400 end time:  6063.  Assessment:  Primary concerns today: Weight management and Blood sugar control.  Sonny was diagnosed with DM in 2013.  He lost >100 lb, mainly from severe restriction and excessive exercise that was not sustainable and resulted in injury.  No physical limitations currently, however.    Most weekends from Sept to April Yuki travels on weekends b/c of his daughter's competition in [place] (like color guard).  This entails a lot of eating out.    Learning Readiness: Ready  Usual eating pattern includes 3 meals and 0-1 snack per day. Frequent foods and beverages include water, ~32 oz sweet tea (1 c sugar/gal = 1 tbsp/cup), bkfst of 2 sausage, 2 eggs, 2 biscuits; lunch of chx, [place], rice/swt potato, veg's; protein shake post-workouts.  Avoided foods include onions, green peppers (heartburn), coconut (disliked), milk & yogurt (disliked).   Usual physical activity includes weight training 60+ min at gym 4-5 X wk.  24-hr recall: (Up at 6:30 AM) B (8:30 AM)-    2 sausage patties, 2 eggs (olive oil spray), 2 biscuits, coffee, 2 creams, Sweet 'n Low.   Snk ( AM)-   --- L (1 PM)-  2 ground [place] patties, 1 1/2 c wild & brn rice, water 4 PM 60-min strength training;  Snk (5 PM)-  1 protein shake Iso-100 (26 g protein) D (7 PM)-  2 egg salad sandwiches, 32 oz sweet tea Snk ( PM)-  1 oatmeal cream pie   Typical day? Yes.    Progress Towards Goal(s):  In progress.   Nutritional Diagnosis:  NI-5.8.2 Excessive carbohydrate intake and NI-5.8.3 Inappropriate intake of types of carbohydrates (specify):   As related to beverages and portion size.  As evidenced by usual intake of 32 oz of sweet tea per day and more than 2 carb exchanges per meal.    Intervention:  Nutrition education.  Handouts given during visit include:   After-Visit Summary (AVS)  Demonstrated degree of understanding via:  Teach Back  Barriers to  learning/adherence to lifestyle change: longstanding habits around large portion sizes.    Monitoring/Evaluation:  Dietary intake, exercise, and body weight in 8 week(s) (weight check).

## 2017-05-07 NOTE — Patient Instructions (Addendum)
Outcome goal: Fit into your [employer] [place] by July 28.    Goals: 1. Continue weights workout 4-5 X wk AND do at least 20 min of cardio exercise 3 X wk.  (Consider stationary bike with varied workouts and/or listen to a podcast.)   2. Limit starchy foods to TWO per meal and ONE per snack. ONE portion of a starchy  food is equal to the following:   - ONE slice of bread (or its equivalent, such as half of a hamburger bun).   - 1/2 cup of a "scoopable" starchy food such as potatoes or rice.   - 15 grams of Total Carbohydrate as shown on food label.   NOTE: YOUR 32 OZ OF SWEET TEA PROVIDES ABOUT 1/4 CUP OF SUGAR (~48 g).  3. Include at every meal: a protein food, a carb food, and vegetables and/or fruit.   - Obtain twice the volume of veg's as protein or carbohydrate foods for both lunch and dinner.   - Fresh or frozen veg's are best.   - Keep frozen veg's on hand for a quick vegetable serving.      Carbohydrate includes starch, sugar, and fiber.  Of these, only sugar and starch raise blood glucose.  (Fiber is found in fruits, vegetables [especially skin, seeds, and stalks] and whole grains.)   Starchy (carb) foods: Bread, rice, pasta, potatoes, corn, cereal, grits, crackers, bagels, muffins, all baked goods.  (Fruit, milk, and yogurt also have carbohydrate, but most of these foods will not spike your blood sugar as most starchy foods will.)  A few fruits do cause high blood sugars; use small portions of bananas (limit to 1/2 at a time), grapes, watermelon, oranges, and most tropical fruits.   Protein foods: Meat, fish, poultry, eggs, dairy foods, and beans such as pinto and kidney beans (beans also provide carbohydrate).   Questions:  [email or web address]; [phone]  Follow-up: Weight Check on May 14 at 2 PM (hold open May 7 in case we need to change date.)  - Consider bringing wife to appt.

## 2017-05-15 ENCOUNTER — Telehealth: Payer: Self-pay | Admitting: Family Medicine

## 2017-05-15 NOTE — Telephone Encounter (Signed)
Call pharmacy. Give okay to split medication into the two parts. Also, no documentation that they tried to get [place]. Make sure to let the patient know that.

## 2017-05-15 NOTE — Telephone Encounter (Signed)
Please advise 

## 2017-05-15 NOTE — Telephone Encounter (Signed)
Copied from [place] [phone]. Topic: Quick Communication - Rx Refill/Question >> May 15, 2017  1:34 PM Scherrie Gerlach wrote: Medication: losartan-hydrochlorothiazide (HYZAAR) 50-12.5 MG tablet  Has the patient contacted their pharmacy? yes, the pharmacy has been waiting to hear back as well.  This med recalled/ back ordered.  Pt needs another Rx, it has been over a week they have been trying to get an answer. Per pt. Pt has tomorrow dose left and that is all.  [id number] - [place], [address] [place] [phone] (Phone) [phone] (Fax)

## 2017-05-16 NOTE — Telephone Encounter (Signed)
Called pharmacy had changed also called patient and let him know. Will call if any questions.

## 2017-05-17 ENCOUNTER — Ambulatory Visit: Payer: BLUE CROSS/BLUE SHIELD | Admitting: Family Medicine

## 2017-06-17 ENCOUNTER — Ambulatory Visit (INDEPENDENT_AMBULATORY_CARE_PROVIDER_SITE_OTHER): Payer: BLUE CROSS/BLUE SHIELD | Admitting: Family Medicine

## 2017-06-17 ENCOUNTER — Encounter: Payer: Self-pay | Admitting: Family Medicine

## 2017-06-17 VITALS — BP 130/82 | HR 72 | Temp 98.4°F | Ht 69.0 in | Wt 245.6 lb

## 2017-06-17 DIAGNOSIS — E119 Type 2 diabetes mellitus without complications: Secondary | ICD-10-CM

## 2017-06-17 DIAGNOSIS — R5381 Other malaise: Secondary | ICD-10-CM | POA: Diagnosis not present

## 2017-06-17 DIAGNOSIS — E785 Hyperlipidemia, unspecified: Secondary | ICD-10-CM

## 2017-06-17 DIAGNOSIS — R5383 Other fatigue: Secondary | ICD-10-CM

## 2017-06-17 DIAGNOSIS — I10 Essential (primary) hypertension: Secondary | ICD-10-CM

## 2017-06-17 DIAGNOSIS — E1159 Type 2 diabetes mellitus with other circulatory complications: Secondary | ICD-10-CM | POA: Diagnosis not present

## 2017-06-17 DIAGNOSIS — E669 Obesity, unspecified: Secondary | ICD-10-CM | POA: Diagnosis not present

## 2017-06-17 DIAGNOSIS — E1169 Type 2 diabetes mellitus with other specified complication: Secondary | ICD-10-CM | POA: Diagnosis not present

## 2017-06-17 DIAGNOSIS — I152 Hypertension secondary to endocrine disorders: Secondary | ICD-10-CM

## 2017-06-17 LAB — COMPREHENSIVE METABOLIC PANEL
ALT: 33 U/L (ref 0–53)
AST: 30 U/L (ref 0–37)
Albumin: 4.6 g/dL (ref 3.5–5.2)
Alkaline Phosphatase: 57 U/L (ref 39–117)
BUN: 12 mg/dL (ref 6–23)
CO2: 27 mEq/L (ref 19–32)
Calcium: 9.5 mg/dL (ref 8.4–10.5)
Chloride: 103 mEq/L (ref 96–112)
Creatinine, Ser: 0.8 mg/dL (ref 0.40–1.50)
GFR: 113.31 mL/min (ref >60.00)
Glucose, Bld: 88 mg/dL (ref 70–99)
Potassium: 3.7 mEq/L (ref 3.5–5.1)
Sodium: 142 mEq/L (ref 135–145)
Total Bilirubin: 0.6 mg/dL (ref 0.2–1.2)
Total Protein: 7.7 g/dL (ref 6.0–8.3)

## 2017-06-17 LAB — LIPID PANEL
Cholesterol: 120 mg/dL (ref 0–200)
HDL: 45 mg/dL (ref >39.00)
LDL Cholesterol: 52 mg/dL (ref 0–99)
NonHDL: 75.05
Total CHOL/HDL Ratio: 3
Triglycerides: 117 mg/dL (ref 0.0–149.0)
VLDL: 23.4 mg/dL (ref 0.0–40.0)

## 2017-06-17 LAB — HEMOGLOBIN A1C: Hgb A1c MFr Bld: 5.5 % (ref 4.6–6.5)

## 2017-06-17 LAB — PSA: PSA: 0.92 ng/mL (ref 0.10–4.00)

## 2017-06-17 NOTE — Progress Notes (Signed)
Robert Gamble is a 42 y.o. male is here for follow up.  History of Present Illness:   Robert Gamble CMA acting as scribe for Dr. Juleen Gamble.  HPI : Patient comes in today for three month follow up.  He is doing very well and has lost about 20 pounds since her last visit in December.  He did follow-up with Dr. Jenne Gamble and felt that it was very helpful.  They were able to target his sweet tea intake as a major source of carbohydrates in his diet.  He is exercising regularly.  He enjoys aerobic exercise and strength training.  His favorite is the rowing machine.  He does note that he is having more muscle pain than he expected.  Taking Zocor regularly.  Compliant with all medications.  Denies chest pain, shortness of breath, edema, headaches, dizziness.  There are no preventive care reminders to display for this patient.   Depression screen [facility] 2/9 06/17/2017 10/30/2016 07/02/2016  Decreased Interest 0 0 0  Down, Depressed, Hopeless 0 0 0  PHQ - 2 Score 0 0 0   PMHx, SurgHx, SocialHx, FamHx, Medications, and Allergies were reviewed in the Visit Navigator and updated as appropriate.   Patient Active Problem List   Diagnosis Date Noted  . Morbid obesity ([place]) 06/17/2017  . Left shoulder pain 10/03/2016  . Migraine without status migrainosus, not intractable 07/27/2016  . Obesity (BMI 30-39.9) 07/02/2016  . Type 2 diabetes mellitus without complication, without long-term current use of insulin ([place]) 07/02/2016  . Hypertension associated with diabetes ([place]) 04/12/2009  . Allergic rhinitis 03/11/2009  . Hypersomnia with sleep apnea 03/04/2009   Social History   Tobacco Use  . Smoking status: Former Research scientist (life sciences)  . Smokeless tobacco: Never Used  Substance Use Topics  . Alcohol use: No  . Drug use: No   Current Medications and Allergies:   .  buPROPion (WELLBUTRIN XL) 150 MG 24 hr tablet, Take 1 tablet (150 mg total) by mouth daily., Disp: 90 tablet, Rfl: 3 .  losartan-hydrochlorothiazide  (HYZAAR) 50-12.5 MG tablet, Take 1 tablet by mouth daily., Disp: 90 tablet, Rfl: 3 .  MELATONIN PO, Take by mouth 3 (three) times a week., Disp: , Rfl:  .  metFORMIN (GLUCOPHAGE) 500 MG tablet, Take 1 tablet (500 mg total) by mouth 2 (two) times daily with a meal., Disp: 180 tablet, Rfl: 3 .  Multiple Vitamin (MULTIVITAMIN) capsule, Take 1 capsule by mouth daily., Disp: , Rfl:  .  naproxen (NAPROSYN) 500 MG tablet, , Disp: , Rfl:  .  nitroGLYCERIN (NITRODUR - DOSED IN MG/24 HR) 0.2 mg/hr patch, Apply 1/4th patch to affected Achilles, change daily (Patient not taking: Reported on 05/07/2017), Disp: 30 patch, Rfl: 1 .  simvastatin (ZOCOR) 20 MG tablet, Take 1 tablet (20 mg total) by mouth every evening., Disp: 90 tablet, Rfl: 3 .  SUMAtriptan (IMITREX) 100 MG tablet, Take 1 tablet (100 mg total) by mouth as needed for migraine. May repeat in 2 hours if headache persists or recurs., Disp: 10 tablet, Rfl: 2 .  verapamil (CALAN) 120 MG tablet, TAKE 1 TABLET (120 MG TOTAL) BY MOUTH 3 (THREE) TIMES DAILY., Disp: 90 tablet, Rfl: 3   Allergies  Allergen Reactions  . Pregabalin Shortness Of Breath  . Phentermine Palpitations  . Ramipril Cough  . Penicillins Rash   Review of Systems   Pertinent items are noted in the HPI. Otherwise, ROS is negative.  Vitals:   Vitals:   06/17/17 9628  BP: 130/82  Pulse: 72  Temp: 98.4 F (36.9 C)  TempSrc: Oral  SpO2: 97%  Weight: 245 lb 9.6 oz (111.4 kg)  Height: 5\' 9"  (1.753 m)     Body mass index is 36.27 kg/m.   Physical Exam:   Physical Exam  Constitutional: He is oriented to person, place, and time. He appears well-developed and well-nourished. No distress.  HENT:  Head: Normocephalic and atraumatic.  Right Ear: External ear normal.  Left Ear: External ear normal.  Nose: Nose normal.  Mouth/Throat: Oropharynx is clear and moist.  Eyes: Pupils are equal, round, and reactive to light. Conjunctivae and EOM are normal.  Neck: Normal range of  motion. Neck supple.  Cardiovascular: Normal rate, regular rhythm, normal heart sounds and intact distal pulses.  Pulmonary/Chest: Effort normal and breath sounds normal.  Abdominal: Soft. Bowel sounds are normal.  Musculoskeletal: Normal range of motion.  Neurological: He is alert and oriented to person, place, and time.  Skin: Skin is warm and dry.  Psychiatric: He has a normal mood and affect. His behavior is normal. Judgment and thought content normal.  Nursing note and vitals reviewed.   Assessment and Plan:   Diagnoses and all orders for this visit:  Hypertension associated with diabetes ([place]) Comments: Doing well.  Continue current medication.  Type 2 diabetes mellitus without complication, without long-term current use of insulin ([place]) Comments: Due for recheck of A1c.  Continue current medication for now.  Continue exercise and healthy diet choices. Orders: -     Comprehensive metabolic panel -     Hemoglobin A1c  Morbid obesity ([place]) Comments: We reviewed his recent diet choices and exercise habits.  Continue current treatments.  Hyperlipidemia associated with type 2 diabetes mellitus ([place]) Comments: On Zocor and compliant.  I did okay a trial off of the statin to see if that helps with his myalgias. Recheck fasting lipid panel today. Orders: -     Comprehensive metabolic panel -     Lipid panel  Malaise and fatigue Comments: Patient asks about testing testosterone.  I am okay with doing that this morning. Orders: -     PSA -     Testos,Total,Free and SHBG (Male)   . Reviewed expectations re: course of current medical issues. . Discussed self-management of symptoms. . Outlined signs and symptoms indicating need for more acute intervention. . Patient verbalized understanding and all questions were answered. Marland Kitchen Health Maintenance issues including appropriate healthy diet, exercise, and smoking avoidance were discussed with patient. . See orders for this visit  as documented in the electronic medical record. . Patient received an After Visit Summary.  Robert Deutscher, DO [place] 06/17/2017  Future Appointments  Date Time Provider [place]  09/16/2017  7:00 AM Robert Deutscher, DO [facility]

## 2017-06-20 LAB — TESTOS,TOTAL,FREE AND SHBG (FEMALE)
Free Testosterone: 60.1 pg/mL (ref 35.0–155.0)
Sex Hormone Binding: 35 nmol/L (ref 10–50)
Testosterone, Total, LC-MS-MS: 371 ng/dL (ref 250–1100)

## 2017-06-24 ENCOUNTER — Ambulatory Visit: Payer: BLUE CROSS/BLUE SHIELD | Admitting: Family Medicine

## 2017-07-01 ENCOUNTER — Other Ambulatory Visit: Payer: Self-pay | Admitting: Family Medicine

## 2017-07-01 DIAGNOSIS — I10 Essential (primary) hypertension: Secondary | ICD-10-CM

## 2017-07-02 ENCOUNTER — Encounter: Payer: Self-pay | Admitting: Family Medicine

## 2017-07-02 NOTE — Progress Notes (Signed)
Wt today is down 17 lb in 2 months.  Robert Gamble has added more cardio exercise, and is watching portion size carefully.  He has also stopped drinking sweet tea.    Workouts include weight training 90 min 4-5 X wk and ~30 min cardio 3-4 X wk, with sometimes walking up to 2 hours.    24-hr recall:  (Up at 6:30 AM) B (8 AM)-  1 Nutri-grain waffle, 2 sausage, 2 eggs, 6 oz apple juice Snk ( AM)-  water L (1 PM)-  8 oz hamburger patties, 1/2 c peas, 1+ c potatoes, water Snk (4 PM)-  1/2 c cot chs with canned 2 tbsp fruit, water 30 min cardio, ~45 min wt training Protein shake post-ex D (8 PM)-  2-3 slc meatloaf, 2 c broc, 1/2 c white rice, water Snk ( PM)-  2 shots bourbon Typical day? Yes.

## 2017-07-02 NOTE — Patient Instructions (Signed)
For breakfast, lean toward protein sources that are more unprocessed meats.   Great job on both dietary changes and increasing exercise, especially cardio.    Keep tracking your blood pressure as you continue to lose weight and keep up the good vegetable intake.  (Remember, the potassium from all those veg's will help lower your BP.)    Make an effort to get some fruit more often, at least 1 serving per day.   (Fruits that are high-glycemic include bananas, watermelon, oranges, mango, papaya, pineapple, grapes.  You can still eat these, but in just small portions.) Low- or moderate-glycemic fruits include apples, pears, plums, peaches, berries, cantaloupe.    Follow-up weight check: July 9 at 3:30 PM.

## 2017-08-03 ENCOUNTER — Other Ambulatory Visit: Payer: Self-pay | Admitting: Family Medicine

## 2017-09-08 ENCOUNTER — Other Ambulatory Visit: Payer: Self-pay | Admitting: Family Medicine

## 2017-09-08 DIAGNOSIS — G43909 Migraine, unspecified, not intractable, without status migrainosus: Secondary | ICD-10-CM

## 2017-09-16 ENCOUNTER — Ambulatory Visit: Payer: BLUE CROSS/BLUE SHIELD | Admitting: Family Medicine

## 2017-10-06 NOTE — Progress Notes (Signed)
Robert Gamble is a 42 y.o. male is here for follow up.  History of Present Illness:   HPI:   1. Hypertension associated with diabetes ([place]). No CP, SOB, edema. Compliant with medications.    2. Type 2 diabetes mellitus without complication, without long-term current use of insulin ([place]).   Lab Results  Component Value Date   HGBA1C 5.8 10/07/2017    3. Hyperlipidemia associated with type 2 diabetes mellitus ([place]). Zocor caused myalgias.   4. Primary insomnia. Ongoing. Hard to fall and stay asleep.   5. Muscle strain. Upper back. Intermittent. Worse over the past 2 weeks. Still exercises regularly. Stopped rowing due to pain.     Health Maintenance Due  Topic Date Due  . PNEUMOCOCCAL POLYSACCHARIDE VACCINE AGE 12-64 HIGH RISK  08/08/1978  . OPHTHALMOLOGY EXAM  09/26/2017   Depression screen [facility] 2/9 10/07/2017 06/17/2017 10/30/2016  Decreased Interest 1 0 0  Down, Depressed, Hopeless 1 0 0  PHQ - 2 Score 2 0 0  Altered sleeping 3 - -  Tired, decreased energy 2 - -  Change in appetite 1 - -  Feeling bad or failure about yourself  0 - -  Trouble concentrating 0 - -  Moving slowly or fidgety/restless 0 - -  Suicidal thoughts 0 - -  PHQ-9 Score 8 - -  Difficult doing work/chores Not difficult at all - -   PMHx, SurgHx, SocialHx, FamHx, Medications, and Allergies were reviewed in the Visit Navigator and updated as appropriate.   Patient Active Problem List   Diagnosis Date Noted  . Hyperlipidemia associated with type 2 diabetes mellitus ([place]) 10/07/2017  . Morbid obesity ([place]) 06/17/2017  . Migraines, prn Imitrex and Naproxen 07/27/2016  . Type 2 diabetes mellitus without complication, on Metformin 07/02/2016  . Hypertension associated with diabetes ([place]), on Losartan, HCTZ, Verapamil 04/12/2009  . Allergic rhinitis 03/11/2009  . Hypersomnia with sleep apnea 03/04/2009   Social History   Tobacco Use  . Smoking status: Former Research scientist (life sciences)  . Smokeless tobacco: Never  Used  Substance Use Topics  . Alcohol use: No  . Drug use: No   Current Medications and Allergies:   .  buPROPion (WELLBUTRIN XL) 150 MG 24 hr tablet, Take 1 tablet (150 mg total) by mouth daily., Disp: 90 tablet, Rfl: 3 .  hydrochlorothiazide (MICROZIDE) 12.5 MG capsule, TAKE 1 CAPSULE BY MOUTH EVERY DAY, Disp: 30 capsule, Rfl: 2 .  losartan-hydrochlorothiazide (HYZAAR) 50-12.5 MG tablet, Take 1 tablet by mouth daily., Disp: 90 tablet, Rfl: 3 .  MELATONIN PO, Take by mouth 3 (three) times a week., Disp: , Rfl:  .  metFORMIN (GLUCOPHAGE) 500 MG tablet, Take 1 tablet (500 mg total) by mouth 2 (two) times daily with a meal., Disp: 180 tablet, Rfl: 3 .  Multiple Vitamin (MULTIVITAMIN) capsule, Take 1 capsule by mouth daily., Disp: , Rfl:  .  naproxen (NAPROSYN) 500 MG tablet, , Disp: , Rfl:  .  simvastatin (ZOCOR) 20 MG tablet, Take 1 tablet (20 mg total) by mouth every evening. (Patient not taking: Reported on 07/02/2017), Disp: 90 tablet, Rfl: 3 .  SUMAtriptan (IMITREX) 100 MG tablet, TAKE 1 TAB AS NEEDWED FOR MIGRAINE, REPEAT IN 2 HRS IF PERSISTS OR RECURS, Disp: 9 tablet, Rfl: 0 .  verapamil (CALAN) 120 MG tablet, TAKE 1 TABLET (120 MG TOTAL) BY MOUTH 3 (THREE) TIMES DAILY., Disp: 90 tablet, Rfl: 3   Allergies  Allergen Reactions  . Pregabalin Shortness Of Breath  . Phentermine  Palpitations  . Ramipril Cough  . Statins     Myalgia   . Zocor [Simvastatin]   . Penicillins Rash   Review of Systems   Pertinent items are noted in the HPI. Otherwise, ROS is negative.  Vitals:   Vitals:   10/07/17 0701  BP: 138/86  Pulse: 74  Temp: 97.6 F (36.4 C)  TempSrc: Oral  SpO2: 96%  Weight: 241 lb 12.8 oz (109.7 kg)  Height: 5\' 9"  (1.753 m)     Body mass index is 35.71 kg/m.  Physical Exam:   Physical Exam  Constitutional: He is oriented to person, place, and time. He appears well-developed and well-nourished. No distress.  HENT:  Head: Normocephalic and atraumatic.  Right Ear:  External ear normal.  Left Ear: External ear normal.  Nose: Nose normal.  Mouth/Throat: Oropharynx is clear and moist.  Eyes: Pupils are equal, round, and reactive to light. Conjunctivae and EOM are normal.  Neck: Normal range of motion. Neck supple.  Cardiovascular: Normal rate, regular rhythm, normal heart sounds and intact distal pulses.  Pulmonary/Chest: Effort normal and breath sounds normal.  Abdominal: Soft. Bowel sounds are normal.  Musculoskeletal: Normal range of motion.  Neurological: He is alert and oriented to person, place, and time.  Skin: Skin is warm and dry.  Psychiatric: He has a normal mood and affect. His behavior is normal. Judgment and thought content normal.  Nursing note and vitals reviewed.  Assessment and Plan:   Robert Gamble was seen today for follow-up.  Diagnoses and all orders for this visit:  Hypertension associated with diabetes ([place])  Type 2 diabetes mellitus without complication, without long-term current use of insulin ([facility]) -     Hemoglobin A1c  Hypersomnia with sleep apnea  Morbid obesity ([place])  Hyperlipidemia associated with type 2 diabetes mellitus ([place]) -     Comprehensive metabolic panel -     Lipid panel  Primary insomnia -     traZODone (DESYREL) 50 MG tablet; Take 0.5-1 tablets (25-50 mg total) by mouth at bedtime as needed for sleep.  Muscle strain -     baclofen (LIORESAL) 10 MG tablet; Take 1 tablet (10 mg total) by mouth 3 (three) times daily.  Myalgia due to statin  Other orders -     LDL cholesterol, direct   . Reviewed expectations re: course of current medical issues. . Discussed self-management of symptoms. . Outlined signs and symptoms indicating need for more acute intervention. . Patient verbalized understanding and all questions were answered. Marland Kitchen Health Maintenance issues including appropriate healthy diet, exercise, and smoking avoidance were discussed with patient. . See orders for this visit as documented in the  electronic medical record. . Patient received an After Visit Summary.  Briscoe Deutscher, DO [place] 10/07/2017  Future Appointments  Date Time Provider [place]  01/06/2018  7:20 AM Briscoe Deutscher, DO [facility]

## 2017-10-07 ENCOUNTER — Ambulatory Visit (INDEPENDENT_AMBULATORY_CARE_PROVIDER_SITE_OTHER): Payer: BLUE CROSS/BLUE SHIELD | Admitting: Family Medicine

## 2017-10-07 ENCOUNTER — Encounter: Payer: Self-pay | Admitting: Family Medicine

## 2017-10-07 VITALS — BP 138/86 | HR 74 | Temp 97.6°F | Ht 69.0 in | Wt 241.8 lb

## 2017-10-07 DIAGNOSIS — I10 Essential (primary) hypertension: Secondary | ICD-10-CM | POA: Diagnosis not present

## 2017-10-07 DIAGNOSIS — E785 Hyperlipidemia, unspecified: Secondary | ICD-10-CM | POA: Diagnosis not present

## 2017-10-07 DIAGNOSIS — I152 Hypertension secondary to endocrine disorders: Secondary | ICD-10-CM

## 2017-10-07 DIAGNOSIS — E1159 Type 2 diabetes mellitus with other circulatory complications: Secondary | ICD-10-CM

## 2017-10-07 DIAGNOSIS — E1169 Type 2 diabetes mellitus with other specified complication: Secondary | ICD-10-CM | POA: Insufficient documentation

## 2017-10-07 DIAGNOSIS — F5101 Primary insomnia: Secondary | ICD-10-CM

## 2017-10-07 DIAGNOSIS — T466X5A Adverse effect of antihyperlipidemic and antiarteriosclerotic drugs, initial encounter: Secondary | ICD-10-CM

## 2017-10-07 DIAGNOSIS — G4730 Sleep apnea, unspecified: Secondary | ICD-10-CM

## 2017-10-07 DIAGNOSIS — E119 Type 2 diabetes mellitus without complications: Secondary | ICD-10-CM

## 2017-10-07 DIAGNOSIS — G4710 Hypersomnia, unspecified: Secondary | ICD-10-CM

## 2017-10-07 DIAGNOSIS — T148XXA Other injury of unspecified body region, initial encounter: Secondary | ICD-10-CM

## 2017-10-07 DIAGNOSIS — M791 Myalgia, unspecified site: Secondary | ICD-10-CM

## 2017-10-07 LAB — LDL CHOLESTEROL, DIRECT: Direct LDL: 119 mg/dL

## 2017-10-07 LAB — COMPREHENSIVE METABOLIC PANEL
ALT: 21 U/L (ref 0–53)
AST: 20 U/L (ref 0–37)
Albumin: 4.8 g/dL (ref 3.5–5.2)
Alkaline Phosphatase: 55 U/L (ref 39–117)
BUN: 17 mg/dL (ref 6–23)
CO2: 28 mEq/L (ref 19–32)
Calcium: 10 mg/dL (ref 8.4–10.5)
Chloride: 100 mEq/L (ref 96–112)
Creatinine, Ser: 0.88 mg/dL (ref 0.40–1.50)
GFR: 101.35 mL/min (ref >60.00)
Glucose, Bld: 88 mg/dL (ref 70–99)
Potassium: 3.1 mEq/L — ABNORMAL LOW (ref 3.5–5.1)
Sodium: 139 mEq/L (ref 135–145)
Total Bilirubin: 0.5 mg/dL (ref 0.2–1.2)
Total Protein: 8.6 g/dL — ABNORMAL HIGH (ref 6.0–8.3)

## 2017-10-07 LAB — LIPID PANEL
Cholesterol: 178 mg/dL (ref 0–200)
HDL: 43.9 mg/dL (ref >39.00)
NonHDL: 133.62
Total CHOL/HDL Ratio: 4
Triglycerides: 271 mg/dL — ABNORMAL HIGH (ref 0.0–149.0)
VLDL: 54.2 mg/dL — ABNORMAL HIGH (ref 0.0–40.0)

## 2017-10-07 LAB — HEMOGLOBIN A1C: Hgb A1c MFr Bld: 5.8 % (ref 4.6–6.5)

## 2017-10-07 MED ORDER — TRAZODONE HCL 50 MG PO TABS: 25.0000 mg | ORAL_TABLET | Freq: Every evening | ORAL | 3 refills | Status: DC | PRN

## 2017-10-07 MED ORDER — BACLOFEN 10 MG PO TABS: 10.0000 mg | ORAL_TABLET | Freq: Three times a day (TID) | ORAL | 0 refills | Status: DC

## 2017-10-15 ENCOUNTER — Other Ambulatory Visit: Payer: Self-pay | Admitting: Family Medicine

## 2017-10-15 DIAGNOSIS — E119 Type 2 diabetes mellitus without complications: Secondary | ICD-10-CM

## 2017-10-15 DIAGNOSIS — I10 Essential (primary) hypertension: Secondary | ICD-10-CM

## 2017-10-15 DIAGNOSIS — E669 Obesity, unspecified: Secondary | ICD-10-CM

## 2017-12-02 ENCOUNTER — Other Ambulatory Visit: Payer: Self-pay | Admitting: Family Medicine

## 2017-12-31 ENCOUNTER — Other Ambulatory Visit: Payer: Self-pay | Admitting: Family Medicine

## 2017-12-31 DIAGNOSIS — T148XXA Other injury of unspecified body region, initial encounter: Secondary | ICD-10-CM

## 2018-01-01 NOTE — Telephone Encounter (Signed)
Please advise on refill. Prescribed on 09/2017. No follow up scheduled.

## 2018-01-06 ENCOUNTER — Ambulatory Visit: Payer: BLUE CROSS/BLUE SHIELD | Admitting: Family Medicine

## 2018-01-27 ENCOUNTER — Other Ambulatory Visit: Payer: Self-pay | Admitting: Family Medicine

## 2018-01-27 DIAGNOSIS — F329 Major depressive disorder, single episode, unspecified: Secondary | ICD-10-CM

## 2018-01-27 DIAGNOSIS — I10 Essential (primary) hypertension: Secondary | ICD-10-CM

## 2018-01-27 DIAGNOSIS — F5101 Primary insomnia: Secondary | ICD-10-CM

## 2018-02-17 ENCOUNTER — Other Ambulatory Visit: Payer: Self-pay | Admitting: Family Medicine

## 2018-02-17 DIAGNOSIS — I10 Essential (primary) hypertension: Secondary | ICD-10-CM

## 2018-04-08 ENCOUNTER — Ambulatory Visit (INDEPENDENT_AMBULATORY_CARE_PROVIDER_SITE_OTHER): Payer: BLUE CROSS/BLUE SHIELD | Admitting: Family Medicine

## 2018-04-08 ENCOUNTER — Encounter: Payer: Self-pay | Admitting: Family Medicine

## 2018-04-08 VITALS — BP 126/82 | HR 76 | Temp 97.6°F | Ht 69.0 in | Wt 262.0 lb

## 2018-04-08 DIAGNOSIS — E119 Type 2 diabetes mellitus without complications: Secondary | ICD-10-CM

## 2018-04-08 DIAGNOSIS — E1159 Type 2 diabetes mellitus with other circulatory complications: Secondary | ICD-10-CM | POA: Diagnosis not present

## 2018-04-08 DIAGNOSIS — B9689 Other specified bacterial agents as the cause of diseases classified elsewhere: Secondary | ICD-10-CM

## 2018-04-08 DIAGNOSIS — G43909 Migraine, unspecified, not intractable, without status migrainosus: Secondary | ICD-10-CM

## 2018-04-08 DIAGNOSIS — E785 Hyperlipidemia, unspecified: Secondary | ICD-10-CM | POA: Diagnosis not present

## 2018-04-08 DIAGNOSIS — T148XXA Other injury of unspecified body region, initial encounter: Secondary | ICD-10-CM

## 2018-04-08 DIAGNOSIS — I10 Essential (primary) hypertension: Secondary | ICD-10-CM

## 2018-04-08 DIAGNOSIS — E1169 Type 2 diabetes mellitus with other specified complication: Secondary | ICD-10-CM | POA: Diagnosis not present

## 2018-04-08 DIAGNOSIS — I152 Hypertension secondary to endocrine disorders: Secondary | ICD-10-CM

## 2018-04-08 DIAGNOSIS — J329 Chronic sinusitis, unspecified: Secondary | ICD-10-CM

## 2018-04-08 DIAGNOSIS — F5101 Primary insomnia: Secondary | ICD-10-CM

## 2018-04-08 LAB — CBC WITH DIFFERENTIAL/PLATELET
Basophils Absolute: 0.1 10*3/uL (ref 0.0–0.1)
Basophils Relative: 0.9 % (ref 0.0–3.0)
Eosinophils Absolute: 0.3 10*3/uL (ref 0.0–0.7)
Eosinophils Relative: 5.2 % — ABNORMAL HIGH (ref 0.0–5.0)
HCT: 42.6 % (ref 39.0–52.0)
Hemoglobin: 15 g/dL (ref 13.0–17.0)
Lymphocytes Relative: 26 % (ref 12.0–46.0)
Lymphs Abs: 1.6 10*3/uL (ref 0.7–4.0)
MCHC: 35.2 g/dL (ref 30.0–36.0)
MCV: 85.8 fl (ref 78.0–100.0)
Monocytes Absolute: 0.8 10*3/uL (ref 0.1–1.0)
Monocytes Relative: 13.4 % — ABNORMAL HIGH (ref 3.0–12.0)
Neutro Abs: 3.4 10*3/uL (ref 1.4–7.7)
Neutrophils Relative %: 54.5 % (ref 43.0–77.0)
Platelets: 270 10*3/uL (ref 150.0–400.0)
RBC: 4.97 Mil/uL (ref 4.22–5.81)
RDW: 13.2 % (ref 11.5–15.5)
WBC: 6.1 10*3/uL (ref 4.0–10.5)

## 2018-04-08 LAB — COMPREHENSIVE METABOLIC PANEL
ALT: 29 U/L (ref 0–53)
AST: 23 U/L (ref 0–37)
Albumin: 4.4 g/dL (ref 3.5–5.2)
Alkaline Phosphatase: 48 U/L (ref 39–117)
BUN: 12 mg/dL (ref 6–23)
CO2: 29 mEq/L (ref 19–32)
Calcium: 9.3 mg/dL (ref 8.4–10.5)
Chloride: 101 mEq/L (ref 96–112)
Creatinine, Ser: 0.73 mg/dL (ref 0.40–1.50)
GFR: 118.02 mL/min (ref >60.00)
Glucose, Bld: 90 mg/dL (ref 70–99)
Potassium: 3.5 mEq/L (ref 3.5–5.1)
Sodium: 140 mEq/L (ref 135–145)
Total Bilirubin: 0.5 mg/dL (ref 0.2–1.2)
Total Protein: 7.7 g/dL (ref 6.0–8.3)

## 2018-04-08 LAB — LIPID PANEL
Cholesterol: 175 mg/dL (ref 0–200)
HDL: 37.2 mg/dL — ABNORMAL LOW (ref >39.00)
NonHDL: 137.92
Total CHOL/HDL Ratio: 5
Triglycerides: 271 mg/dL — ABNORMAL HIGH (ref 0.0–149.0)
VLDL: 54.2 mg/dL — ABNORMAL HIGH (ref 0.0–40.0)

## 2018-04-08 LAB — HEMOGLOBIN A1C: Hgb A1c MFr Bld: 5.6 % (ref 4.6–6.5)

## 2018-04-08 LAB — LDL CHOLESTEROL, DIRECT: Direct LDL: 114 mg/dL

## 2018-04-08 MED ORDER — TRAZODONE HCL 100 MG PO TABS: 100.0000 mg | ORAL_TABLET | Freq: Every day | ORAL | 1 refills | Status: DC

## 2018-04-08 MED ORDER — SUMATRIPTAN SUCCINATE 100 MG PO TABS: ORAL_TABLET | ORAL | 0 refills | Status: DC

## 2018-04-08 MED ORDER — BACLOFEN 10 MG PO TABS: 10.0000 mg | ORAL_TABLET | Freq: Three times a day (TID) | ORAL | 1 refills | Status: DC

## 2018-04-08 MED ORDER — AZITHROMYCIN 250 MG PO TABS: ORAL_TABLET | ORAL | 0 refills | Status: DC

## 2018-04-08 NOTE — Progress Notes (Signed)
Robert Gamble is a 43 y.o. male is here for follow up.  History of Present Illness:   Robert Gamble, CMA acting as scribe for Dr. Briscoe Deutscher.   HPI:   Patient has had ongoing sinus congestion with b/l ear congestion. Worsened over the last few weeks. No fever.   Wife with medical issues and no insurance. 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 Up 20 pounds. Feels like "the walking dead." Seeing wife's therapist with her. Not exercising at all.   1. Hypertension associated with diabetes (Robert Gamble). No CP, SOB, edema. Compliant with medications.  Review: taking medications as instructed, no medication side effects noted, no TIAs, no chest pain on exertion, no dyspnea on exertion, no swelling of ankles. Smoker: No.    2. Type 2 diabetes mellitus without complication, without long-term current use of insulin (Robert Gamble).  Medication compliance: compliant all of the time, diabetic diet compliance: compliant all of the time, home glucose monitoring: is not performed, further diabetic ROS: no polyuria or polydipsia, no chest pain, dyspnea or TIA's, no numbness, tingling or pain in extremities.   3. Hyperlipidemia associated with type 2 diabetes mellitus (Robert Gamble). Zocor caused myalgias.   4. Primary insomnia. Ongoing. Hard to fall and stay asleep. He is getting about 4-5 hours of sleep of interrupted sleep.     Health Maintenance Due  Topic Date Due  . PNEUMOCOCCAL POLYSACCHARIDE VACCINE AGE 44-64 HIGH RISK  08/08/1978  . OPHTHALMOLOGY EXAM  09/26/2017  . FOOT EXAM  10/30/2017   Depression screen Robert Gamble 2/9 04/08/2018 10/07/2017 06/17/2017  Decreased Interest 1 1 0  Down, Depressed, Hopeless 1 1 0  PHQ - 2 Score 2 2 0  Altered sleeping 2 3 -  Tired, decreased energy 3 2 -  Change in appetite 2 1 -  Feeling bad or failure about yourself  0 0 -  Trouble concentrating 2 0 -  Moving slowly or fidgety/restless 0 0 -  Suicidal thoughts 0 0 -  PHQ-9 Score 11 8 -  Difficult doing work/chores  Somewhat difficult Not difficult at all -   PMHx, SurgHx, SocialHx, FamHx, Medications, and Allergies were reviewed in the Visit Navigator and updated as appropriate.   Patient Active Problem List   Diagnosis Date Noted  . Hyperlipidemia associated with type 2 diabetes mellitus (Robert Gamble) 10/07/2017  . Morbid obesity (Robert Gamble) 06/17/2017  . Migraines, prn Imitrex and Naproxen 07/27/2016  . Type 2 diabetes mellitus without complication, on Metformin 07/02/2016  . Hypertension associated with diabetes (Robert Gamble), on Losartan, HCTZ, Verapamil 04/12/2009  . Allergic rhinitis 03/11/2009  . Hypersomnia with sleep apnea 03/04/2009   Social History   Tobacco Use  . Smoking status: Former Research scientist (life sciences)  . Smokeless tobacco: Never Used  Substance Use Topics  . Alcohol use: No  . Drug use: No   Current Medications and Allergies   .  baclofen (LIORESAL) 10 MG tablet, Take 1 tablet (10 mg total) by mouth 3 (three) times daily., Disp: 30 each, Rfl: 1 .  buPROPion (WELLBUTRIN XL) 150 MG 24 hr tablet, TAKE 1 TABLET BY MOUTH EVERY DAY, Disp: 30 tablet, Rfl: 2 .  hydrochlorothiazide (MICROZIDE) 12.5 MG capsule, TAKE 1 CAPSULE BY MOUTH EVERY DAY, Disp: 30 capsule, Rfl: 2 .  losartan-hydrochlorothiazide (HYZAAR) 50-12.5 MG tablet, TAKE 1 TABLET BY MOUTH EVERY DAY, Disp: 30 tablet, Rfl: 11 .  MELATONIN PO, Take by mouth 3 (three) times a week., Disp: , Rfl:  .  metFORMIN (GLUCOPHAGE) 500 MG tablet, TAKE 1  TABLET (500 MG TOTAL) BY MOUTH 2 (TWO) TIMES DAILY WITH A MEAL., Disp: 60 tablet, Rfl: 11 .  Multiple Vitamin (MULTIVITAMIN) capsule, Take 1 capsule by mouth daily., Disp: , Rfl:  .  naproxen (NAPROSYN) 500 MG tablet, , Disp: , Rfl:  .  SUMAtriptan (IMITREX) 100 MG tablet, TAKE 1 TAB AS NEEDWED FOR MIGRAINE, REPEAT IN 2 HRS IF PERSISTS OR RECURS, Disp: 9 tablet, Rfl: 0 .  verapamil (CALAN) 120 MG tablet, TAKE 1 TABLET (120 MG TOTAL) BY MOUTH 3 (THREE) TIMES DAILY., Disp: 90 tablet, Rfl: 3 .  azithromycin (ZITHROMAX) 250  MG tablet, 2 po on day one, then one po each day until gone., Disp: 6 tablet, Rfl: 0 .  traZODone (DESYREL) 100 MG tablet, Take 1 tablet (100 mg total) by mouth at bedtime., Disp: 90 tablet, Rfl: 1   Allergies  Allergen Reactions  . Pregabalin Shortness Of Breath  . Phentermine Palpitations  . Ramipril Cough  . Statins     Myalgia   . Zocor [Simvastatin]   . Penicillins Rash   Review of Systems   Pertinent items are noted in the HPI. Otherwise, a complete ROS is negative.  Vitals   Vitals:   04/08/18 0755  BP: 126/82  Pulse: 76  Temp: 97.6 F (36.4 C)  TempSrc: Oral  SpO2: 95%  Weight: 262 lb (118.8 kg)  Height: 5\' 9"  (1.753 m)     Body mass index is 38.69 kg/m.  Physical Exam   Physical Exam Vitals signs and nursing note reviewed.  Constitutional:      General: He is not in acute distress.    Appearance: He is well-developed.  HENT:     Head: Normocephalic and atraumatic.     Right Ear: External ear normal.     Left Ear: External ear normal.     Nose:     Right Sinus: Maxillary sinus tenderness present.     Left Sinus: Maxillary sinus tenderness present.  Eyes:     Conjunctiva/sclera: Conjunctivae normal.     Pupils: Pupils are equal, round, and reactive to light.  Neck:     Musculoskeletal: Normal range of motion and neck supple.  Cardiovascular:     Rate and Rhythm: Normal rate and regular rhythm.     Heart sounds: Normal heart sounds.  Pulmonary:     Effort: Pulmonary effort is normal.     Breath sounds: Normal breath sounds.  Abdominal:     General: Bowel sounds are normal.     Palpations: Abdomen is soft.  Musculoskeletal: Normal range of motion.  Skin:    General: Skin is warm and dry.  Neurological:     Mental Status: He is alert and oriented to person, place, and time.  Psychiatric:        Behavior: Behavior normal.        Thought Content: Thought content normal.        Judgment: Judgment normal.     Assessment and Plan   [name] was  seen today for follow-up.  Diagnoses and all orders for this visit:  Hyperlipidemia associated with type 2 diabetes mellitus (Robert Gamble) -     Comprehensive metabolic panel -     Lipid panel  Hypertension associated with diabetes ([place]), on Losartan, HCTZ, Verapamil  Type 2 diabetes mellitus without complication, on Metformin -     CBC with Differential/Platelet -     Hemoglobin A1c  Muscle strain -     baclofen (LIORESAL) 10 MG tablet;  Take 1 tablet (10 mg total) by mouth 3 (three) times daily.  Migraine without status migrainosus, not intractable, unspecified migraine type Comments: We reviewed the importance of hydration and monitoring symptoms. Refill Imitrex today. Orders: -     SUMAtriptan (IMITREX) 100 MG tablet; TAKE 1 TAB AS NEEDWED FOR MIGRAINE, REPEAT IN 2 HRS IF PERSISTS OR RECURS  Bacterial sinusitis -     azithromycin (ZITHROMAX) 250 MG tablet; 2 po on day one, then one po each day until gone.  Primary insomnia -     traZODone (DESYREL) 100 MG tablet; Take 1 tablet (100 mg total) by mouth at bedtime.    . Orders and follow up as documented in [place], reviewed diet, exercise and weight control, cardiovascular risk and specific lipid/LDL goals reviewed, reviewed medications and side effects in detail.  . Reviewed expectations re: course of current medical issues. . Outlined signs and symptoms indicating need for more acute intervention. . Patient verbalized understanding and all questions were answered. . Patient received an After Visit Summary.  CMA served as Education administrator during this visit. History, Physical, and Plan performed by medical provider. The above documentation has been reviewed and is accurate and complete. Briscoe Deutscher, D.O.  Briscoe Deutscher, DO [place], [facility] 04/08/2018

## 2018-04-20 ENCOUNTER — Other Ambulatory Visit: Payer: Self-pay | Admitting: Family Medicine

## 2018-04-21 NOTE — Telephone Encounter (Signed)
Ok to fill 

## 2018-04-29 ENCOUNTER — Other Ambulatory Visit: Payer: Self-pay | Admitting: Family Medicine

## 2018-04-29 DIAGNOSIS — F5101 Primary insomnia: Secondary | ICD-10-CM

## 2018-04-29 DIAGNOSIS — I10 Essential (primary) hypertension: Secondary | ICD-10-CM

## 2018-04-29 DIAGNOSIS — F329 Major depressive disorder, single episode, unspecified: Secondary | ICD-10-CM

## 2018-06-06 ENCOUNTER — Encounter (HOSPITAL_BASED_OUTPATIENT_CLINIC_OR_DEPARTMENT_OTHER): Payer: Self-pay | Admitting: *Deleted

## 2018-06-06 ENCOUNTER — Emergency Department (HOSPITAL_BASED_OUTPATIENT_CLINIC_OR_DEPARTMENT_OTHER): Payer: BLUE CROSS/BLUE SHIELD

## 2018-06-06 ENCOUNTER — Emergency Department (HOSPITAL_BASED_OUTPATIENT_CLINIC_OR_DEPARTMENT_OTHER)
Admission: EM | Admit: 2018-06-06 | Discharge: 2018-06-06 | Disposition: A | Discharge status: Home / Self Care | Payer: BLUE CROSS/BLUE SHIELD | Attending: Emergency Medicine | Admitting: Emergency Medicine

## 2018-06-06 ENCOUNTER — Other Ambulatory Visit: Payer: Self-pay

## 2018-06-06 DIAGNOSIS — Z87891 Personal history of nicotine dependence: Secondary | ICD-10-CM | POA: Insufficient documentation

## 2018-06-06 DIAGNOSIS — K1120 Sialoadenitis, unspecified: Secondary | ICD-10-CM | POA: Diagnosis not present

## 2018-06-06 DIAGNOSIS — I10 Essential (primary) hypertension: Secondary | ICD-10-CM | POA: Insufficient documentation

## 2018-06-06 DIAGNOSIS — E119 Type 2 diabetes mellitus without complications: Secondary | ICD-10-CM | POA: Diagnosis not present

## 2018-06-06 DIAGNOSIS — K115 Sialolithiasis: Secondary | ICD-10-CM | POA: Diagnosis not present

## 2018-06-06 DIAGNOSIS — R221 Localized swelling, mass and lump, neck: Secondary | ICD-10-CM | POA: Diagnosis not present

## 2018-06-06 DIAGNOSIS — Z79899 Other long term (current) drug therapy: Secondary | ICD-10-CM | POA: Insufficient documentation

## 2018-06-06 DIAGNOSIS — Z7984 Long term (current) use of oral hypoglycemic drugs: Secondary | ICD-10-CM | POA: Insufficient documentation

## 2018-06-06 LAB — BASIC METABOLIC PANEL
Anion gap: 8 (ref 5–15)
BUN: 15 mg/dL (ref 6–20)
CO2: 25 mmol/L (ref 22–32)
Calcium: 9.4 mg/dL (ref 8.9–10.3)
Chloride: 101 mmol/L (ref 98–111)
Creatinine, Ser: 0.85 mg/dL (ref 0.61–1.24)
GFR calc Af Amer: >60 mL/min (ref >60)
GFR calc non Af Amer: >60 mL/min (ref >60)
Glucose, Bld: 105 mg/dL — ABNORMAL HIGH (ref 70–99)
Potassium: 3.2 mmol/L — ABNORMAL LOW (ref 3.5–5.1)
Sodium: 134 mmol/L — ABNORMAL LOW (ref 135–145)

## 2018-06-06 MED ORDER — IOHEXOL 300 MG/ML  SOLN
100.0000 mL | Freq: Once | INTRAMUSCULAR | Status: AC | PRN
  Administered 2018-06-06: 72 mL via INTRAVENOUS

## 2018-06-06 NOTE — ED Triage Notes (Signed)
Pt c/o right facial swelling x 1 day , denies pain

## 2018-06-06 NOTE — Discharge Instructions (Signed)
Please refer to the attached instructions. Follow-up with ENT as discussed.

## 2018-06-06 NOTE — ED Notes (Signed)
Pt understood dc material. NAD noted. Follow up information reviewed. All questions answered to satisfaction. Pt escorted to checkout counter.

## 2018-06-06 NOTE — ED Provider Notes (Signed)
[facility] EMERGENCY DEPARTMENT Provider Note   CSN: [id number] Arrival date & time: 06/06/18  1946    History   Chief Complaint Chief Complaint  Patient presents with  . Facial Swelling    HPI Eythan Jayne is a 43 y.o. male.     Patient states he was drinking a Dr. Malachi Bonds and suddenly developed discomfort to the right mandible just below the TMJ. While eating supper, the discomfort intensified, and he noted swelling at the angle of the jaw. No ear pain. No TMJ tenderness. Able to open mouth without difficulty.  The history is provided by the patient. No language interpreter was used.  Illness  Location:  Right submandibular Quality:  Pain and swelling Severity:  Moderate Onset quality:  Sudden Duration:  3 hours Progression:  Unchanged Chronicity:  New Associated symptoms: no ear pain     Past Medical History:  Diagnosis Date  . Allergic rhinitis   . Chronic headache   . DM2 (diabetes mellitus, type 2) ([place])   . HTN (hypertension)   . OSA on CPAP     Patient Active Problem List   Diagnosis Date Noted  . Hyperlipidemia associated with type 2 diabetes mellitus ([place]) 10/07/2017  . Morbid obesity ([employer]) 06/17/2017  . Migraines, prn Imitrex and Naproxen 07/27/2016  . Type 2 diabetes mellitus without complication, on Metformin 07/02/2016  . Hypertension associated with diabetes ([place]), on Losartan, HCTZ, Verapamil 04/12/2009  . Allergic rhinitis 03/11/2009  . Hypersomnia with sleep apnea 03/04/2009    Past Surgical History:  Procedure Laterality Date  . APPENDECTOMY          Home Medications    Prior to Admission medications   Medication Sig Start Date End Date Taking? Authorizing Provider  loratadine (CLARITIN) 10 MG tablet Take 10 mg by mouth daily.   Yes [provider]  baclofen (LIORESAL) 10 MG tablet Take 1 tablet (10 mg total) by mouth 3 (three) times daily. 04/08/18   Briscoe Deutscher, DO  buPROPion (WELLBUTRIN XL) 150 MG  24 hr tablet TAKE 1 TABLET BY MOUTH EVERY DAY 04/29/18   Briscoe Deutscher, DO  hydrochlorothiazide (MICROZIDE) 12.5 MG capsule TAKE 1 CAPSULE BY MOUTH EVERY DAY 04/29/18   Briscoe Deutscher, DO  losartan-hydrochlorothiazide (HYZAAR) 50-12.5 MG tablet TAKE 1 TABLET BY MOUTH EVERY DAY 02/17/18   Briscoe Deutscher, DO  MELATONIN PO Take by mouth 3 (three) times a week.    [provider]  metFORMIN (GLUCOPHAGE) 500 MG tablet TAKE 1 TABLET (500 MG TOTAL) BY MOUTH 2 (TWO) TIMES DAILY WITH A MEAL. 10/15/17   Briscoe Deutscher, DO  Multiple Vitamin (MULTIVITAMIN) capsule Take 1 capsule by mouth daily.    [provider]  naproxen (NAPROSYN) 500 MG tablet  09/19/16   [provider]  SUMAtriptan (IMITREX) 100 MG tablet TAKE 1 TAB AS NEEDWED FOR MIGRAINE, REPEAT IN 2 HRS IF PERSISTS OR RECURS 04/08/18   Briscoe Deutscher, DO  traZODone (DESYREL) 100 MG tablet Take 1 tablet (100 mg total) by mouth at bedtime. 04/08/18   Briscoe Deutscher, DO  traZODone (DESYREL) 50 MG tablet TAKE 0.5-1 TABLETS (25-50 MG TOTAL) BY MOUTH AT BEDTIME AS NEEDED FOR SLEEP. 04/29/18   Briscoe Deutscher, DO  verapamil (CALAN) 120 MG tablet TAKE 1 TABLET (120 MG TOTAL) BY MOUTH 3 (THREE) TIMES DAILY. 04/21/18   Briscoe Deutscher, DO  Phentermine-Topiramate 7.5-46 MG CP24 Take 1 capsule by mouth daily. Patient not taking: Reported on 07/12/2016 07/02/16 07/12/16  Briscoe Deutscher, DO  Family History Family History  Problem Relation Age of Onset  . Hypertension Father   . Sleep apnea Sister   . Diabetes Sister   . Hyperlipidemia Sister   . Hypertension Sister   . Mental illness Sister   . Lung cancer Other   . Clotting disorder Other   . Emphysema Other   . Heart disease Other   . Cancer Maternal Grandmother   . Cancer Maternal Grandfather     Social History Social History   Tobacco Use  . Smoking status: Former Research scientist (life sciences)  . Smokeless tobacco: Never Used  Substance Use Topics  . Alcohol use: No  . Drug use: No     Allergies    Pregabalin; Phentermine; Ramipril; Statins; Zocor [simvastatin]; and Penicillins   Review of Systems Review of Systems  HENT: Positive for facial swelling. Negative for ear pain.   All other systems reviewed and are negative.    Physical Exam Updated Vital Signs BP (!) 205/105   Pulse 84   Temp 98.4 F (36.9 C) (Oral)   Resp 18   Ht 5\' 9"  (1.753 m)   Wt 120.2 kg   SpO2 97%   BMI 39.13 kg/m   Physical Exam Vitals signs and nursing note reviewed.  Constitutional:      General: He is not in acute distress.    Appearance: Normal appearance. He is not ill-appearing.  Eyes:     Conjunctiva/sclera: Conjunctivae normal.  Neck:     Musculoskeletal: Normal range of motion.   Cardiovascular:     Rate and Rhythm: Normal rate.  Pulmonary:     Effort: Pulmonary effort is normal.  Musculoskeletal: Normal range of motion.  Skin:    General: Skin is warm and dry.  Neurological:     Mental Status: He is alert and oriented to person, place, and time.  Psychiatric:        Mood and Affect: Mood normal.      ED Treatments / Results  Labs (all labs ordered are listed, but only abnormal results are displayed) Labs Reviewed  BASIC METABOLIC PANEL - Abnormal; Notable for the following components:      Result Value   Sodium 134 (*)    Potassium 3.2 (*)    Glucose, Bld 105 (*)    All other components within normal limits    EKG None  Radiology Ct Soft Tissue Neck W Contrast  Result Date: 06/06/2018 CLINICAL DATA:  Initial evaluation for acute right-sided face/jaw swelling, pain. EXAM: CT NECK WITH CONTRAST TECHNIQUE: Multidetector CT imaging of the neck was performed using the standard protocol following the bolus administration of intravenous contrast. CONTRAST:  75mL OMNIPAQUE IOHEXOL 300 MG/ML  SOLN COMPARISON:  None. FINDINGS: Pharynx and larynx: Oral cavity within normal limits without discrete mass or loculated fluid collection. No acute abnormality about the dentition.  Oropharynx and nasopharynx within normal limits. No retropharyngeal collection. Epiglottis grossly normal. Remainder of the hypopharynx and supraglottic larynx within normal limits. True cords symmetric and normal. Subglottic airway clear. Salivary glands: 3 mm obstructive stone within the mid-distal right Stensen's duct (series 3, image 40). Associated hazy inflammatory stranding with increased attenuation within the right parotid gland, compatible with associated parotitis. Mild inflammatory changes within the subcutaneous fat of the overlying right parotid space. No discrete abscess or drainable fluid collection. Left parotid gland and submandibular glands are normal in appearance. Thyroid: Thyroid normal. Lymph nodes: No pathologically enlarged lymph nodes identified within the neck. Vascular: Normal intravascular enhancement seen throughout  the neck. Mild atherosclerotic change about the aortic arch and carotid bifurcations bilaterally. Limited intracranial: Unremarkable. Visualized orbits: Globes and orbital soft tissues within normal limits. Mastoids and visualized paranasal sinuses: Small right maxillary sinus retention cyst. Paranasal sinuses are otherwise clear. Mastoid air cells and middle ear cavities are well pneumatized and free of fluid. Skeleton: No acute osseous finding. No discrete lytic or blastic osseous lesions. Mild cervical spondylolysis noted at C5-6 and C6-7. Upper chest: Visualized upper chest demonstrates no acute finding. Other: None. IMPRESSION: 1. 3 mm obstructive stone within the right Stensen's duct with associated acute right parotitis. No associated abscess or drainable fluid collection. 2. Mild atherosclerosis. Electronically Signed   By: Jeannine Boga M.D.   On: 06/06/2018 21:42    Procedures Procedures (including critical care time)  Medications Ordered in ED Medications - No data to display   Initial Impression / Assessment and Plan / ED Course  I have reviewed  the triage vital signs and the nursing notes.  Pertinent labs & imaging results that were available during my care of the patient were reviewed by me and considered in my medical decision making (see chart for details).        Patient with sialolithiasis. No abscess or drainable fluid collection. Care instructions provided. Return precautions discussed. Follow-up with ENT. Patient appears safe for discharge.  Final Clinical Impressions(s) / ED Diagnoses   Final diagnoses:  Sialolithiasis, ductal    ED Discharge Orders    None       Nickey, Kloep[place], NP 06/06/18 2226    Veryl Speak, MD 06/07/18 1520

## 2018-06-06 NOTE — ED Notes (Signed)
Per Radiology protocol -- pt is diabetic on Metformin, therefore CMP or BMP results are required prior to scanning.

## 2018-07-07 ENCOUNTER — Telehealth: Payer: BLUE CROSS/BLUE SHIELD | Admitting: Family Medicine

## 2018-07-08 ENCOUNTER — Other Ambulatory Visit: Payer: Self-pay | Admitting: Family Medicine

## 2018-07-08 DIAGNOSIS — F5101 Primary insomnia: Secondary | ICD-10-CM

## 2018-07-08 NOTE — Telephone Encounter (Signed)
Last OV 04/08/18 Trazodone 100 mg and Trazodone 50 mg both on rx list Last refill Trazodone 100 mg 04/08/2018 #90/1                 Trazodone 50 mg 04/29/2018 #30/2 Next OV not scheduled  Called pt and left VM to call the office. Want to confirm prescription before refilling.

## 2018-07-14 NOTE — Telephone Encounter (Signed)
Please advise called patent for information but no call back yet has f/u on 5/19

## 2018-07-14 NOTE — Progress Notes (Signed)
Virtual Visit via Video   Due to the COVID-19 pandemic, this visit was completed with telemedicine (audio/video) technology to reduce patient and provider exposure as well as to preserve personal protective equipment.   I connected with Robert Gamble by a video enabled telemedicine application and verified that I am speaking with the correct person using two identifiers. Location patient: Home Location provider: [facility], Office Persons participating in the virtual visit: Robert Gamble, Robert Deutscher, DO Robert Gamble, CMA acting as scribe for Dr. Briscoe Gamble.  I discussed the limitations of evaluation and management by telemedicine and the availability of in person appointments. The patient expressed understanding and agreed to proceed.  Care Team   Patient Care Team: Robert Deutscher, DO as PCP - General (Family Medicine) Robert Gamble, RD as Dietitian [facility])  Subjective:   HPI:   Anxiety:  Patient has noticed increased anxiety over the last two months. His family has noticed that he is a little more quick tempered. I has had increased issues with sleeping. Is currently on trazodone 50mg . Was increased to 100mg  at last visit but left him feeling sedated so he went back down. He is currently only getting about 3-4 hrs of sleep each night. He does not have problem going to sleep, but is unable to stay asleep.   He has increased is exercises doing frequent walks with his wife a few times a week.   Knee pain: Patient has had increased right knee pain. Pain can get to 8 or 9 out of 10 when walking up stairs or climbing. He denies any swelling or redness to area. Has never had evaluation by orthopedics.   Review of Systems  Constitutional: Negative for chills and fever.  HENT: Negative for hearing loss and tinnitus.   Eyes: Negative for blurred vision and double vision.  Respiratory: Negative for cough.   Cardiovascular: Negative for chest pain and  palpitations.  Gastrointestinal: Negative for heartburn, nausea and vomiting.  Genitourinary: Negative for dysuria.  Musculoskeletal: Negative for myalgias.  Skin: Negative for rash.  Neurological: Negative for dizziness and headaches.  Psychiatric/Behavioral: Negative for suicidal ideas.    Patient Active Problem List   Diagnosis Date Noted  . Hyperlipidemia associated with type 2 diabetes mellitus (Robert Gamble) 10/07/2017  . Morbid obesity (Robert Gamble) 06/17/2017  . Migraines, prn Imitrex and Naproxen 07/27/2016  . Type 2 diabetes mellitus without complication, on Metformin 07/02/2016  . Hypertension associated with diabetes ([place]), on Losartan, HCTZ, Verapamil 04/12/2009  . Allergic rhinitis 03/11/2009  . Hypersomnia with sleep apnea 03/04/2009    Social History   Tobacco Use  . Smoking status: Former Research scientist (life sciences)  . Smokeless tobacco: Never Used  Substance Use Topics  . Alcohol use: No   Current Outpatient Medications:  .  baclofen (LIORESAL) 10 MG tablet, Take 1 tablet (10 mg total) by mouth 3 (three) times daily., Disp: 30 each, Rfl: 1 .  buPROPion (WELLBUTRIN XL) 150 MG 24 hr tablet, TAKE 1 TABLET BY MOUTH EVERY DAY, Disp: 30 tablet, Rfl: 2 .  hydrochlorothiazide (MICROZIDE) 12.5 MG capsule, TAKE 1 CAPSULE BY MOUTH EVERY DAY, Disp: 30 capsule, Rfl: 2 .  loratadine (CLARITIN) 10 MG tablet, Take 10 mg by mouth daily., Disp: , Rfl:  .  losartan-hydrochlorothiazide (HYZAAR) 50-12.5 MG tablet, TAKE 1 TABLET BY MOUTH EVERY DAY, Disp: 30 tablet, Rfl: 11 .  MELATONIN PO, Take by mouth 3 (three) times a week., Disp: , Rfl:  .  metFORMIN (GLUCOPHAGE) 500 MG tablet,  TAKE 1 TABLET (500 MG TOTAL) BY MOUTH 2 (TWO) TIMES DAILY WITH A MEAL., Disp: 60 tablet, Rfl: 11 .  Multiple Vitamin (MULTIVITAMIN) capsule, Take 1 capsule by mouth daily., Disp: , Rfl:  .  naproxen (NAPROSYN) 500 MG tablet, , Disp: , Rfl:  .  SUMAtriptan (IMITREX) 100 MG tablet, TAKE 1 TAB AS NEEDWED FOR MIGRAINE, REPEAT IN 2 HRS IF PERSISTS  OR RECURS, Disp: 9 tablet, Rfl: 0 .  traZODone (DESYREL) 100 MG tablet, Take 1 tablet (100 mg total) by mouth at bedtime., Disp: 90 tablet, Rfl: 1 .  traZODone (DESYREL) 50 MG tablet, TAKE 0.5-1 TABLETS (25-50 MG TOTAL) BY MOUTH AT BEDTIME AS NEEDED FOR SLEEP., Disp: 30 tablet, Rfl: 2 .  verapamil (CALAN) 120 MG tablet, TAKE 1 TABLET (120 MG TOTAL) BY MOUTH 3 (THREE) TIMES DAILY., Disp: 90 tablet, Rfl: 3  Allergies  Allergen Reactions  . Pregabalin Shortness Of Breath  . Phentermine Palpitations  . Ramipril Cough  . Statins     Myalgia   . Zocor [Simvastatin]   . Penicillins Rash   Objective:   VITALS: Per patient if applicable, see vitals. GENERAL: Alert, appears well and in no acute distress. HEENT: Atraumatic, conjunctiva clear, no obvious abnormalities on inspection of external nose and ears. NECK: Normal movements of the head and neck. CARDIOPULMONARY: No increased WOB. Speaking in clear sentences. I:E ratio WNL.  MS: Moves all visible extremities without noticeable abnormality. PSYCH: Pleasant and cooperative, well-groomed. Speech normal rate and rhythm. Affect is appropriate. Insight and judgement are appropriate. Attention is focused, linear, and appropriate.  NEURO: CN grossly intact. Oriented as arrived to appointment on time with no prompting. Moves both UE equally.  SKIN: No obvious lesions, wounds, erythema, or cyanosis noted on face or hands.  Depression screen [facility] 2/9 07/15/2018 04/08/2018 10/07/2017  Decreased Interest 0 1 1  Down, Depressed, Hopeless 0 1 1  PHQ - 2 Score 0 2 2  Altered sleeping 3 2 3   Tired, decreased energy 0 3 2  Change in appetite 1 2 1   Feeling bad or failure about yourself  0 0 0  Trouble concentrating 0 2 0  Moving slowly or fidgety/restless 0 0 0  Suicidal thoughts 0 0 0  PHQ-9 Score 4 11 8   Difficult doing work/chores Not difficult at all Somewhat difficult Not difficult at all    Assessment and Plan:   [name] was seen today for  follow-up.  Diagnoses and all orders for this visit:  Hypersomnia with sleep apnea Comments: Last sleep study was > 10 years ago. Will get another.  Orders: -     Ambulatory referral to Sleep Studies  Acute pain of right knee Comments: Concern for meniscus injury. To Ortho since he has other chronic pain in joints (ankles).  Orders: -     Ambulatory referral to Orthopedic Surgery  Morbid obesity ([place])  Hypertension associated with diabetes ([place]), on Losartan, HCTZ, Verapamil Comments: Elevated BP today. He will keep a log and send this week with more data.   Primary insomnia Comments: Increase to 75 mg po q hs. Adding Cymbalta.   Anxiety -     escitalopram (LEXAPRO) 10 MG tablet; Take 1 tablet (10 mg total) by mouth daily.   Marland Kitchen COVID-19 Education: The signs and symptoms of COVID-19 were discussed with the patient and how to seek care for testing if needed. The importance of social distancing was discussed today. . Reviewed expectations re: course of current medical issues. Marland Kitchen  Discussed self-management of symptoms. . Outlined signs and symptoms indicating need for more acute intervention. . Patient verbalized understanding and all questions were answered. Marland Kitchen Health Maintenance issues including appropriate healthy diet, exercise, and smoking avoidance were discussed with patient. . See orders for this visit as documented in the electronic medical record.  Robert Deutscher, DO  Records requested if needed. Time spent: 25 minutes, of which >50% was spent in obtaining information about his symptoms, reviewing his previous labs, evaluations, and treatments, counseling him about his condition (please see the discussed topics above), and developing a plan to further investigate it; he had a number of questions which I addressed.

## 2018-07-14 NOTE — Telephone Encounter (Signed)
Will hold until tomorrow and clarification.

## 2018-07-15 ENCOUNTER — Other Ambulatory Visit: Payer: Self-pay | Admitting: Family Medicine

## 2018-07-15 ENCOUNTER — Other Ambulatory Visit: Payer: Self-pay

## 2018-07-15 ENCOUNTER — Encounter: Payer: Self-pay | Admitting: Family Medicine

## 2018-07-15 ENCOUNTER — Ambulatory Visit (INDEPENDENT_AMBULATORY_CARE_PROVIDER_SITE_OTHER): Payer: BLUE CROSS/BLUE SHIELD | Admitting: Family Medicine

## 2018-07-15 VITALS — Ht 69.0 in

## 2018-07-15 DIAGNOSIS — F5101 Primary insomnia: Secondary | ICD-10-CM

## 2018-07-15 DIAGNOSIS — G4730 Sleep apnea, unspecified: Secondary | ICD-10-CM

## 2018-07-15 DIAGNOSIS — I152 Hypertension secondary to endocrine disorders: Secondary | ICD-10-CM

## 2018-07-15 DIAGNOSIS — G4710 Hypersomnia, unspecified: Secondary | ICD-10-CM | POA: Diagnosis not present

## 2018-07-15 DIAGNOSIS — E1159 Type 2 diabetes mellitus with other circulatory complications: Secondary | ICD-10-CM | POA: Diagnosis not present

## 2018-07-15 DIAGNOSIS — I10 Essential (primary) hypertension: Secondary | ICD-10-CM

## 2018-07-15 DIAGNOSIS — M25561 Pain in right knee: Secondary | ICD-10-CM | POA: Diagnosis not present

## 2018-07-15 DIAGNOSIS — F419 Anxiety disorder, unspecified: Secondary | ICD-10-CM

## 2018-07-15 MED ORDER — ESCITALOPRAM OXALATE 10 MG PO TABS: 10.0000 mg | ORAL_TABLET | Freq: Every day | ORAL | 1 refills | Status: DC

## 2018-07-16 ENCOUNTER — Encounter: Payer: Self-pay | Admitting: Family Medicine

## 2018-07-18 ENCOUNTER — Encounter: Payer: Self-pay | Admitting: Family Medicine

## 2018-07-22 ENCOUNTER — Telehealth: Payer: Self-pay | Admitting: Neurology

## 2018-07-22 ENCOUNTER — Ambulatory Visit (INDEPENDENT_AMBULATORY_CARE_PROVIDER_SITE_OTHER): Payer: BLUE CROSS/BLUE SHIELD | Admitting: Physician Assistant

## 2018-07-22 ENCOUNTER — Other Ambulatory Visit: Payer: Self-pay

## 2018-07-22 ENCOUNTER — Encounter: Payer: Self-pay | Admitting: Physician Assistant

## 2018-07-22 VITALS — BP 160/85 | HR 79 | Temp 98.5°F | Ht 69.0 in | Wt 262.0 lb

## 2018-07-22 DIAGNOSIS — Z23 Encounter for immunization: Secondary | ICD-10-CM

## 2018-07-22 DIAGNOSIS — E1159 Type 2 diabetes mellitus with other circulatory complications: Secondary | ICD-10-CM | POA: Diagnosis not present

## 2018-07-22 DIAGNOSIS — I152 Hypertension secondary to endocrine disorders: Secondary | ICD-10-CM

## 2018-07-22 DIAGNOSIS — I10 Essential (primary) hypertension: Secondary | ICD-10-CM

## 2018-07-22 NOTE — Patient Instructions (Signed)
It was great to see you!  We will contact you tomorrow with the blood pressure plan after we review with Dr. Juleen China, per your request.  If you develop any chest pain, severe headache, shortness of breath or other symptoms in the meantime, go to the ER.  Take care,  Inda Coke PA-C

## 2018-07-22 NOTE — Progress Notes (Signed)
Robert Gamble is a 43 y.o. male here for a follow up of a pre-existing problem.  I acted as a Education administrator for [employer], PA-C Robert Pickler, LPN  History of Present Illness:   Chief Complaint  Patient presents with  . Hypertension    HPI  HTN -- Currently taking HCTZ 12.5 mg, Verapamil 120 mg TID and Hyzaar 50-12.5 mg. At home blood pressure readings are: 165/90's. Patient denies chest pain, SOB, blurred vision, dizziness, unusual headaches, lower leg swelling. Patient is compliant with medication. Denies excessive caffeine intake, stimulant usage, excessive alcohol intake, or increase in salt consumption.  Pursuing sleep study soon.  Past Medical History:  Diagnosis Date  . Allergic rhinitis   . Chronic headache   . DM2 (diabetes mellitus, type 2) ([place])   . HTN (hypertension)   . OSA on CPAP      Social History   Socioeconomic History  . Marital status: Married    Spouse name: Not on file  . Number of children: Not on file  . Years of education: Not on file  . Highest education level: Not on file  Occupational History  . Not on file  Social Needs  . Financial resource strain: Not on file  . Food insecurity:    Worry: Not on file    Inability: Not on file  . Transportation needs:    Medical: Not on file    Non-medical: Not on file  Tobacco Use  . Smoking status: Former Research scientist (life sciences)  . Smokeless tobacco: Never Used  Substance and Sexual Activity  . Alcohol use: No  . Drug use: No  . Sexual activity: Yes    Birth control/protection: Rhythm  Lifestyle  . Physical activity:    Days per week: Not on file    Minutes per session: Not on file  . Stress: Not on file  Relationships  . Social connections:    Talks on phone: Not on file    Gets together: Not on file    Attends religious service: Not on file    Active member of club or organization: Not on file    Attends meetings of clubs or organizations: Not on file    Relationship status: Not on file  .  Intimate partner violence:    Fear of current or ex partner: Not on file    Emotionally abused: Not on file    Physically abused: Not on file    Forced sexual activity: Not on file  Other Topics Concern  . Not on file  Social History Narrative   Quit ETOH, Street drugs   Married, daughter   Patient is a current smoker, 1/2ppd   Quit ETOH, Street drugs          Past Surgical History:  Procedure Laterality Date  . APPENDECTOMY      Family History  Problem Relation Age of Onset  . Hypertension Father   . Sleep apnea Sister   . Diabetes Sister   . Hyperlipidemia Sister   . Hypertension Sister   . Mental illness Sister   . Lung cancer Other   . Clotting disorder Other   . Emphysema Other   . Heart disease Other   . Cancer Maternal Grandmother   . Cancer Maternal Grandfather     Allergies  Allergen Reactions  . Pregabalin Shortness Of Breath  . Phentermine Palpitations  . Ramipril Cough  . Statins     Myalgia   . Zocor [Simvastatin]   .  Penicillins Rash    Current Medications:   Current Outpatient Medications:  .  baclofen (LIORESAL) 10 MG tablet, Take 1 tablet (10 mg total) by mouth 3 (three) times daily., Disp: 30 each, Rfl: 1 .  buPROPion (WELLBUTRIN XL) 150 MG 24 hr tablet, TAKE 1 TABLET BY MOUTH EVERY DAY, Disp: 30 tablet, Rfl: 2 .  escitalopram (LEXAPRO) 10 MG tablet, Take 1 tablet (10 mg total) by mouth daily., Disp: 30 tablet, Rfl: 1 .  loratadine (CLARITIN) 10 MG tablet, Take 10 mg by mouth daily., Disp: , Rfl:  .  MELATONIN PO, Take by mouth 3 (three) times a week., Disp: , Rfl:  .  metFORMIN (GLUCOPHAGE) 500 MG tablet, TAKE 1 TABLET (500 MG TOTAL) BY MOUTH 2 (TWO) TIMES DAILY WITH A MEAL., Disp: 60 tablet, Rfl: 11 .  Multiple Vitamin (MULTIVITAMIN) capsule, Take 1 capsule by mouth daily., Disp: , Rfl:  .  naproxen (NAPROSYN) 500 MG tablet, , Disp: , Rfl:  .  SUMAtriptan (IMITREX) 100 MG tablet, TAKE 1 TAB AS NEEDWED FOR MIGRAINE, REPEAT IN 2 HRS IF  PERSISTS OR RECURS, Disp: 9 tablet, Rfl: 0 .  traZODone (DESYREL) 100 MG tablet, Take 1 tablet (100 mg total) by mouth at bedtime. (Patient taking differently: Take 50 mg by mouth at bedtime. ), Disp: 90 tablet, Rfl: 1 .  traZODone (DESYREL) 50 MG tablet, TAKE 0.5-1 TABLETS (25-50 MG TOTAL) BY MOUTH AT BEDTIME AS NEEDED FOR SLEEP. (Patient taking differently: Take 25 mg by mouth at bedtime as needed for sleep. ), Disp: 30 tablet, Rfl: 2 .  verapamil (CALAN) 120 MG tablet, TAKE 1 TABLET (120 MG TOTAL) BY MOUTH 3 (THREE) TIMES DAILY., Disp: 90 tablet, Rfl: 3 .  losartan-hydrochlorothiazide (HYZAAR) 100-25 MG tablet, Take 1 tablet by mouth daily., Disp: 30 tablet, Rfl: 1   Review of Systems:   Review of Systems  Constitutional: Negative for chills, fever, malaise/fatigue and weight loss.  Respiratory: Negative for shortness of breath.   Cardiovascular: Negative for chest pain, orthopnea, claudication and leg swelling.  Gastrointestinal: Negative for heartburn, nausea and vomiting.  Neurological: Negative for dizziness, tingling and headaches.    Vitals:   Vitals:   07/22/18 1545  BP: (!) 160/85  Pulse: 79  Temp: 98.5 F (36.9 C)  SpO2: 97%  Weight: 262 lb (118.8 kg)  Height: 5\' 9"  (1.753 m)     Body mass index is 38.69 kg/m.  Physical Exam:   Physical Exam Vitals signs and nursing note reviewed.  Constitutional:      General: He is not in acute distress.    Appearance: He is well-developed. He is not ill-appearing or toxic-appearing.  Cardiovascular:     Rate and Rhythm: Normal rate and regular rhythm.     Pulses: Normal pulses.     Heart sounds: Normal heart sounds, S1 normal and S2 normal.     Comments: No LE edema Pulmonary:     Effort: Pulmonary effort is normal.     Breath sounds: Normal breath sounds.  Skin:    General: Skin is warm and dry.  Neurological:     Mental Status: He is alert.     GCS: GCS eye subscore is 4. GCS verbal subscore is 5. GCS motor subscore  is 6.  Psychiatric:        Speech: Speech normal.        Behavior: Behavior normal. Behavior is cooperative.      Assessment and Plan:   Robert Gamble was seen today  for hypertension.  Diagnoses and all orders for this visit:  Hypertension associated with diabetes ([place]), on Losartan, HCTZ, Verapamil Uncontrolled. Briefly discussed with patient's PCP per his request. Will change to Hyzaar 100-25. Discontinue HCTZ 12.5. Continue Verapamil. Updated BMP. Follow-up in 2 weeks. Worsening precautions advised. Continue sleep study work-up. -     Basic metabolic panel; Future  Need for prophylactic vaccination with combined diphtheria-tetanus-pertussis (DTP) vaccine -     Tdap vaccine greater than or equal to 7yo IM  Other orders -     losartan-hydrochlorothiazide (HYZAAR) 100-25 MG tablet; Take 1 tablet by mouth daily.  . Reviewed expectations re: course of current medical issues. . Discussed self-management of symptoms. . Outlined signs and symptoms indicating need for more acute intervention. . Patient verbalized understanding and all questions were answered. . See orders for this visit as documented in the electronic medical record. . Patient received an After-Visit Summary.  CMA or LPN served as scribe during this visit. History, Physical, and Plan performed by medical provider. The above documentation has been reviewed and is accurate and complete.  Inda Coke, PA-C

## 2018-07-22 NOTE — Telephone Encounter (Signed)

## 2018-07-23 ENCOUNTER — Encounter: Payer: Self-pay | Admitting: Physician Assistant

## 2018-07-23 ENCOUNTER — Telehealth: Payer: Self-pay | Admitting: Physician Assistant

## 2018-07-23 MED ORDER — LOSARTAN POTASSIUM-HCTZ 100-25 MG PO TABS: 1.0000 | ORAL_TABLET | Freq: Every day | ORAL | 1 refills | Status: DC

## 2018-07-23 NOTE — Telephone Encounter (Signed)
Please call patient and make sure he received my Mychart message and schedule lab visit.  "I spoke with Dr. Juleen China. She agreed with my recommendation.  I have changed your Hyzaar (Losartan - HCTZ) to 100 mg Losartan + 25 mg HCTZ.  You will stop the other Hyzaar you have been taking as well as the 12.5 mg HCTZ.  Continue verapamil.  I will also need you to come by for a lab today if possible. Just to update your potassium."  Inda Coke PA-C

## 2018-07-23 NOTE — Telephone Encounter (Signed)
Pt called back,had a discussion about medication changes. Aldona Bar spoke with Dr. Juleen China. She agreed with my recommendation. I have changed your Hyzaar (Losartan - HCTZ) to 100 mg Losartan + 25 mg HCTZ You will stop the other Hyzaar you have been taking as well as the 12.5 mg HCTZ. Continue verapamil. Pt. Verbalized understanding.  also need you to come by for a lab today if possible. Just to update your potassium." Pt refused to come by for lab work on today and will schedule at [facility].

## 2018-07-23 NOTE — Telephone Encounter (Signed)
Left message to call office

## 2018-07-24 ENCOUNTER — Other Ambulatory Visit: Payer: Self-pay | Admitting: Family Medicine

## 2018-07-24 ENCOUNTER — Encounter: Payer: Self-pay | Admitting: Neurology

## 2018-07-24 DIAGNOSIS — F329 Major depressive disorder, single episode, unspecified: Secondary | ICD-10-CM

## 2018-07-24 DIAGNOSIS — F5101 Primary insomnia: Secondary | ICD-10-CM

## 2018-07-24 DIAGNOSIS — I10 Essential (primary) hypertension: Secondary | ICD-10-CM

## 2018-07-24 NOTE — Telephone Encounter (Signed)

## 2018-07-29 ENCOUNTER — Ambulatory Visit (INDEPENDENT_AMBULATORY_CARE_PROVIDER_SITE_OTHER): Payer: BC Managed Care – PPO | Admitting: Neurology

## 2018-07-29 ENCOUNTER — Encounter: Payer: Self-pay | Admitting: Neurology

## 2018-07-29 ENCOUNTER — Other Ambulatory Visit: Payer: Self-pay

## 2018-07-29 DIAGNOSIS — R0683 Snoring: Secondary | ICD-10-CM

## 2018-07-29 DIAGNOSIS — G4719 Other hypersomnia: Secondary | ICD-10-CM | POA: Diagnosis not present

## 2018-07-29 DIAGNOSIS — G4733 Obstructive sleep apnea (adult) (pediatric): Secondary | ICD-10-CM | POA: Diagnosis not present

## 2018-07-29 DIAGNOSIS — E119 Type 2 diabetes mellitus without complications: Secondary | ICD-10-CM

## 2018-07-29 DIAGNOSIS — I10 Essential (primary) hypertension: Secondary | ICD-10-CM | POA: Insufficient documentation

## 2018-07-29 DIAGNOSIS — G4709 Other insomnia: Secondary | ICD-10-CM

## 2018-07-29 DIAGNOSIS — Z6841 Body Mass Index (BMI) 40.0 and over, adult: Secondary | ICD-10-CM

## 2018-07-29 NOTE — Patient Instructions (Signed)
Follow Up Instructions:  I will order both, HST and attended sleep study, to make sure the patient's insurance policy will cover the study.   My main goal is to see if sleep apnea is again present, and if it explains the EDS and Insomnia concerns as well.  Treatment of apnea may help him to sleep longer and deeper, achieving more N 3 sleep, the deep sleep of insulin secretion and of hG-Hormone release, thus helping to gain control of DM and HTN- and weight loss!  The patient will remain on his anti depressant medications, as an improvement in his depression assessment by [place] [facility] is already documented.  I also spoke briefly about alternatives to CPAP especially since the patient has a very narrow nasal bridge and a tendency to have restricted nasal airflow.   He would be interested in alternatives to CPAP such as a dental device or the INSPIRE procedure if his type of sleep disordered breathing is amendable to that. Weight loss with a medical guided program such as weight and wellness should be considered to support him in his need. Larey Seat, MD

## 2018-07-29 NOTE — Progress Notes (Signed)
Virtual Visit via Video Note    SLEEP MEDICINE CONSULTATION   I connected with Robert Gamble on 07/29/18 at 11:00 AM EDT by a video enabled telemedicine application and verified that I am speaking with the correct person using two identifiers.  Location: Patient: at work  Provider: at [facility]   I discussed the limitations of evaluation and management by telemedicine and the availability of in person appointments. The patient expressed understanding and agreed to proceed.     Provider:  Larey Seat, M D  Referring Provider: Briscoe Deutscher, DO Primary Care Physician:  Briscoe Deutscher, DO    HPI:  Robert Gamble is a 43 y.o. male  Is seen here in a video guided telemedicine consultation after a referral  from Dr. Juleen China for possible sleep apnea.  Robert Gamble is a 43 year old Caucasian gentleman whose current chief complaint is a.  Of 6 months during which he gets on average only 3 to 4 hours of nocturnal sleep.  This all started before the coronavirus lockdown.  He reports that he had a sleep study 7 years ago at [place] [facility] was diagnosed with obstructive sleep apnea and prescribed CPAP.  Within the next 24 months he lost 100 pounds and felt or was told that he does not need CPAP anymore.  Unfortunately he has gained his weight back he reports a current weight of 272 pounds at a height of 5 foot 9 inches.  His wife has started to hear him snore loudly again, and he struggles with daytime sleepiness and fatigue.   I reviewed all the patient's medications as provided on Dr. Lutricia Horsfall referral. The patient has a history of morbid obesity, hypertension associated with diabetes type 2, treated on 3 medications, losartan, hydrochlorothiazide, verapamil. has acute knee pain but also reports that this does not bother him as much at night only when he actually walks. He suffers from migraines and has been taking Naprosyn and Imitrex.  Imitrex of course as needed.   He  endorsed the PHQ 9 score for depression in February 2020 at 11 points and now on the new medication on 15 Jul 2018 at only 4 points for this seems to have been an significant improvement.   He has trouble with insomnia not initiating sleep but maintaining sleep, and has been diagnosed with anxiety he takes Cymbalta and Lexapro.  He has apparently been taking trazodone-0 50 mg tablets and tried a house by mouth at bedtime as needed for sleep he is also been on verapamil .  He has been taking baclofen for spasm.  Wellbutrin used as a daytime booster.  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  He drinks 1 coffee in the morning and iced tea at dinner, alcohol is consumed very seldomly.  He does not endorse any form of tobacco use and is a lifelong non-smoker.   Sleep habits: The patient describes that family dinner is between 6 and 7:30 PM, bedtime is usually around 10 PM.  He has no trouble falling asleep when he goes to bed the bedroom is cool but not quiet and dark for the first hour as his wife insists on watching TV.  The TV however is on a timer and does not accompany the couple throughout the night.  Habitually he sleeps or falls asleep in supine position but he wakes up on his side.  He sleeps with 1-2  pillows, and he usually stays asleep for almost 4 hours before he wakes up sometimes it is nocturia that will wake him up but sometimes it is spontaneous and he is not aware of the trigger.  He denies having any vivid dreams or nightmares, and while he has some problems with knee pain it does not seem to affect him at night, does not create PLM's or restless legs.  However he has trouble to resume sleep again.  When he rises in the morning around 6 he feels unrefreshed as if he needs more sleep.  He avoids naps- he is basically physically active all day which helps him to overcome sleepiness, but as soon as he returns  home after work and sits down to rest he can fall asleep in no time. TST is 3-4 hours only for 6 month .    Review of Systems: Out of a complete 14 system review, the patient complains of only the following symptoms, and all other reviewed systems are negative. How likely are you to doze in the following situations: 0 = not likely, 1 = slight chance, 2 = moderate chance, 3 = high chance  Sitting and Reading?1 Watching Television?3 Sitting inactive in a public place (theater or meeting)?2 Lying down in the afternoon when circumstances permit?1 Sitting and talking to someone?0 Sitting quietly after lunch without alcohol?3 In a car, while stopped for a few minutes in traffic?0 As a passenger in a car for an hour without a break?2  Total = 10-12 points out of /24    Social History   Socioeconomic History  . Marital status: Married    Spouse name: Not on file  . Number of children: Not on file  . Years of education: Not on file  . Highest education level: Not on file  Occupational History  . Not on file  Social Needs  . Financial resource strain: Not on file  . Food insecurity:    Worry: Not on file    Inability: Not on file  . Transportation needs:    Medical: Not on file    Non-medical: Not on file  Tobacco Use  . Smoking status: Former Research scientist (life sciences)  . Smokeless tobacco: Never Used  Substance and Sexual Activity  . Alcohol use: No  . Drug use: No  . Sexual activity: Yes    Birth control/protection: Rhythm  Lifestyle  . Physical activity:    Days per week: Not on file    Minutes per session: Not on file  . Stress: Not on file  Relationships  . Social connections:    Talks on phone: Not on file    Gets together: Not on file    Attends religious service: Not on file    Active member of club or organization: Not on file    Attends meetings of clubs or organizations: Not on file    Relationship status: Not on file  . Intimate partner violence:    Fear of current or ex  partner: Not on file    Emotionally abused: Not on file    Physically abused: Not on file    Forced sexual activity: Not on file  Other Topics Concern  . Not on file  Social History Narrative   Quit ETOH, Street drugs   Married, daughter   Patient is a current smoker, 1/2ppd   Quit ETOH, Street drugs          Family History  Problem Relation Age of Onset  . Hypertension  Father   . Sleep apnea Sister   . Diabetes Sister   . Hyperlipidemia Sister   . Hypertension Sister   . Mental illness Sister   . Lung cancer Other   . Clotting disorder Other   . Emphysema Other   . Heart disease Other   . Cancer Maternal Grandmother   . Cancer Maternal Grandfather     Past Medical History:  Diagnosis Date  . Allergic rhinitis   . Chronic headache   . DM2 (diabetes mellitus, type 2) ([place])   . HTN (hypertension)   . OSA on CPAP     Past Surgical History:  Procedure Laterality Date  . APPENDECTOMY      Current Outpatient Medications  Medication Sig Dispense Refill  . baclofen (LIORESAL) 10 MG tablet Take 1 tablet (10 mg total) by mouth 3 (three) times daily. 30 each 1  . buPROPion (WELLBUTRIN XL) 150 MG 24 hr tablet TAKE 1 TABLET BY MOUTH EVERY DAY 90 tablet 1  . escitalopram (LEXAPRO) 10 MG tablet Take 1 tablet (10 mg total) by mouth daily. 30 tablet 1  . loratadine (CLARITIN) 10 MG tablet Take 10 mg by mouth daily.    Marland Kitchen losartan-hydrochlorothiazide (HYZAAR) 100-25 MG tablet Take 1 tablet by mouth daily. 30 tablet 1  . MELATONIN PO Take by mouth 3 (three) times a week.    . metFORMIN (GLUCOPHAGE) 500 MG tablet TAKE 1 TABLET (500 MG TOTAL) BY MOUTH 2 (TWO) TIMES DAILY WITH A MEAL. 60 tablet 11  . Multiple Vitamin (MULTIVITAMIN) capsule Take 1 capsule by mouth daily.    . naproxen (NAPROSYN) 500 MG tablet     . SUMAtriptan (IMITREX) 100 MG tablet TAKE 1 TAB AS NEEDWED FOR MIGRAINE, REPEAT IN 2 HRS IF PERSISTS OR RECURS 9 tablet 0  . traZODone (DESYREL) 100 MG tablet Take 1 tablet  (100 mg total) by mouth at bedtime. (Patient taking differently: Take 50 mg by mouth at bedtime. ) 90 tablet 1  . traZODone (DESYREL) 50 MG tablet TAKE 0.5-1 TABLETS (25-50 MG TOTAL) BY MOUTH AT BEDTIME AS NEEDED FOR SLEEP. 90 tablet 1  . verapamil (CALAN) 120 MG tablet TAKE 1 TABLET (120 MG TOTAL) BY MOUTH 3 (THREE) TIMES DAILY. 90 tablet 3   No current facility-administered medications for this visit.     Allergies as of 07/29/2018 - Review Complete 07/24/2018  Allergen Reaction Noted  . Pregabalin Shortness Of Breath 06/21/2009  . Phentermine Palpitations 07/27/2016  . Ramipril Cough 03/22/2010  . Statins  10/07/2017  . Zocor [simvastatin]  10/07/2017  . Penicillins Rash     Vitals: There were no vitals taken for this visit. Last Weight:  Wt Readings from Last 1 Encounters:  07/22/18 262 lb (118.8 kg)   Last Height:   Ht Readings from Last 1 Encounters:  07/22/18 5\' 9"  (1.753 m)    Observation:  General: The patient is awake, alert and appears not in acute distress. The patient appears well groomed. Head: Normocephalic, atraumatic.  Neck is supple. Mallampati 3, neck circumference: 19" Cardiovascular: without distended neck veins. Respiratory: Lung capacity_ patient is able to hold his breath for 28 seconds.  Skin:  Without evidence of hand or facial edema, or rash Trunk: BMI : related to given height and weight. 40.2 kg/m2.  Neurologic exam : The patient is awake and alert, oriented to place and time.  Memory subjective  described as intact. There is a normal attention span & concentration ability. Speech is fluent without  dysarthria, dysphonia or aphasia. Mood and affect are appropriate.  Cranial nerves: Pupils are equal .Extraocular movements  in vertical and horizontal planes intact and without nystagmus.   Facial motor strength is symmetric and tongue and uvula move midline. Tongue protrusion into either cheek is normal. Shoulder shrug is normal.   Motor exam:   Normal muscle bulk and symmetric  ROM/ strength in all extremities. Coordination: Rapid alternating movements in the fingers/hands were normal without evidence of ataxia, dysmetria or tremor. Patient walks without assistive device    Assessment and Plan: The patient has clinical depression which an account for fatigue and early morning arousals. He has nocturia times one at night, not a factor in sleep fragmentation.  Chief risk factor is a significant weight / BMI problem, affecting his chances of having OSA, UARS and poorly restorative sleep. This would explain EDS ( excessive daytime sleepiness) as well.    Follow Up Instructions:  I will order both, HST and attended sleep study to make sure the patient's insurance policy will cover the study.   My main goal is to see if apnea is present, and if it explains the EDS and Insomnia concerns as well.  Treatment of apnea may help him to sleep longer and deeper, achieving more N 3 sleep, the deep sleep of insulin secretion and of hG-Hormone release., helping DM and HTN- and weight loss!  The patient will remain on his anti depressant medications, as an improvement in his depression assessment by [place] [facility] is already documented.  I also spoke briefly about alternatives to CPAP especially since the patient has a very narrow nasal bridge and a tendency to have restricted nasal airflow.   He would be interested in alternatives to CPAP such as a dental device or the INSPIRE procedure if his type of sleep disordered breathing is amendable to that. Weight loss with a medical guided program such as weight and wellness should be considered to support him in his need.   I discussed the assessment and treatment plan with the patient. The patient was provided an opportunity to ask questions and all were answered. The patient agreed with the plan and demonstrated an understanding of the instructions.   The patient was advised to call back or seek an in-person  evaluation if the symptoms worsen or if the condition fails to improve as anticipated.  I provided 25 minutes of non-face-to-face time during this encounter and prepared with review of referral notes, calculation of BMI, and additional questions to social history.   Larey Seat, MD  07-29-2018  Robert Partridge Shawnte Winton MD 07/29/2018

## 2018-08-01 ENCOUNTER — Ambulatory Visit (INDEPENDENT_AMBULATORY_CARE_PROVIDER_SITE_OTHER): Payer: BLUE CROSS/BLUE SHIELD | Admitting: Orthopaedic Surgery

## 2018-08-01 ENCOUNTER — Encounter: Payer: Self-pay | Admitting: Orthopaedic Surgery

## 2018-08-01 ENCOUNTER — Other Ambulatory Visit: Payer: Self-pay

## 2018-08-01 ENCOUNTER — Ambulatory Visit: Payer: Self-pay

## 2018-08-01 DIAGNOSIS — M25561 Pain in right knee: Secondary | ICD-10-CM

## 2018-08-01 DIAGNOSIS — G8929 Other chronic pain: Secondary | ICD-10-CM

## 2018-08-01 DIAGNOSIS — M25562 Pain in left knee: Secondary | ICD-10-CM

## 2018-08-01 MED ORDER — LIDOCAINE HCL 1 % IJ SOLN
2.0000 mL | INTRAMUSCULAR | Status: AC | PRN
  Administered 2018-08-01: 2 mL

## 2018-08-01 MED ORDER — METHYLPREDNISOLONE ACETATE 40 MG/ML IJ SUSP
40.0000 mg | INTRAMUSCULAR | Status: AC | PRN
  Administered 2018-08-01: 40 mg via INTRA_ARTICULAR

## 2018-08-01 MED ORDER — BUPIVACAINE HCL 0.25 % IJ SOLN
2.0000 mL | INTRAMUSCULAR | Status: AC | PRN
  Administered 2018-08-01: 2 mL via INTRA_ARTICULAR

## 2018-08-01 NOTE — Progress Notes (Signed)
Office Visit Note   Patient: Robert Gamble           Date of Birth: Sep 13, 1975           MRN: [id number] Visit Date: 08/01/2018              Requested by: Briscoe Deutscher, [address] [place], [place] [address] PCP: Briscoe Deutscher, DO   Assessment & Plan: Visit Diagnoses:  1. Chronic pain of both knees     Plan: Impression is bilateral knee patellofemoral compartment osteoarthritis.  We injected the left knee with cortisone today.  He will keep a close eye on his blood sugars over the next week.  He will follow-up with [place] in 10 to 14 days for cortisone injection to the right knee.  Call with concerns or questions in the meantime.  Follow-Up Instructions: Return in about 2 weeks (around 08/15/2018) for right knee cortisone inj.   Orders:  Orders Placed This Encounter  Procedures  . Large Joint Inj: L knee  . XR Knee 1-2 Views Right  . XR Knee 1-2 Views Left   No orders of the defined types were placed in this encounter.     Procedures: Large Joint Inj: L knee on 08/01/2018 8:27 AM Indications: pain Details: 22 G needle, anterolateral approach Medications: 2 mL bupivacaine 0.25 %; 2 mL lidocaine 1 %; 40 mg methylPREDNISolone acetate 40 MG/ML      Clinical Data: No additional findings.   Subjective: Chief Complaint  Patient presents with  . Right Knee - Pain    HPI patient is a pleasant 43 year old gentleman who presents our clinic today with bilateral knee pain left greater than right.  He notes he has had decades of knee pain with ever having a specific injury.  His pain has recently worsened.  The pain he has is retropatellar.  He describes this as a dull ache with occasional walking to the right knee.  His pain appears to be worse with stairs and inclines as well as going from a seated to standing position.  He has relief of symptoms at rest.  He has tried NSAIDs with minimal relief of symptoms.  No previous cortisone injection or surgical  intervention.  Of note, he is a diabetic with his last A1c of 5.6 in February 2020.  Review of Systems as detailed in HPI.  All others reviewed and are negative.   Objective: Vital Signs: There were no vitals taken for this visit.  Physical Exam well-developed well-nourished gentleman in no acute distress.  Alert and oriented x3.  Ortho Exam examination of both knees show no effusions.  Range of motion 0 to 125 degrees.  Marked patellofemoral crepitus both sides.  No joint line tenderness.  Cruciates and collaterals are stable.  He is neurovascular intact distally.  Specialty Comments:  No specialty comments available.  Imaging: Xr Knee 1-2 Views Left  Result Date: 08/01/2018 X-rays of the left knee show marked degenerative changes patellofemoral compartment with slight joint space narrowing medial compartment.  Otherwise, no acute findings.  Xr Knee 1-2 Views Right  Result Date: 08/01/2018 X-rays of the right knee show marked degenerative changes patellofemoral compartment.  Otherwise, no acute findings    PMFS History: Patient Active Problem List   Diagnosis Date Noted  . Snoring 07/29/2018  . OSA (obstructive sleep apnea) 07/29/2018  . Other insomnia 07/29/2018  . Class 3 severe obesity due to excess calories with serious comorbidity and body mass index (BMI) of  40.0 to 44.9 in adult [facility]) 07/29/2018  . Excessive daytime sleepiness 07/29/2018  . HTN, goal below 130/80 07/29/2018  . Hyperlipidemia associated with type 2 diabetes mellitus ([place]) 10/07/2017  . Morbid obesity ([place]) 06/17/2017  . Migraines, prn Imitrex and Naproxen 07/27/2016  . Type 2 diabetes mellitus without complication, on Metformin 07/02/2016  . Chronic pain of both knees 03/22/2010  . Hypertension associated with diabetes ([place]), on Losartan, HCTZ, Verapamil 04/12/2009  . Allergic rhinitis 03/11/2009  . Hypersomnia with sleep apnea 03/04/2009   Past Medical History:  Diagnosis Date  . Allergic rhinitis    . Chronic headache   . DM2 (diabetes mellitus, type 2) ([place])   . HTN (hypertension)   . OSA on CPAP     Family History  Problem Relation Age of Onset  . Hypertension Father   . Sleep apnea Sister   . Diabetes Sister   . Hyperlipidemia Sister   . Hypertension Sister   . Mental illness Sister   . Lung cancer Other   . Clotting disorder Other   . Emphysema Other   . Heart disease Other   . Cancer Maternal Grandmother   . Cancer Maternal Grandfather     Past Surgical History:  Procedure Laterality Date  . APPENDECTOMY     Social History   Occupational History  . Not on file  Tobacco Use  . Smoking status: Former Research scientist (life sciences)  . Smokeless tobacco: Never Used  Substance and Sexual Activity  . Alcohol use: No  . Drug use: No  . Sexual activity: Yes    Birth control/protection: Rhythm

## 2018-08-06 ENCOUNTER — Other Ambulatory Visit: Payer: Self-pay | Admitting: Family Medicine

## 2018-08-06 DIAGNOSIS — G43909 Migraine, unspecified, not intractable, without status migrainosus: Secondary | ICD-10-CM

## 2018-08-06 DIAGNOSIS — F419 Anxiety disorder, unspecified: Secondary | ICD-10-CM

## 2018-08-07 ENCOUNTER — Other Ambulatory Visit: Payer: Self-pay | Admitting: Family Medicine

## 2018-08-07 DIAGNOSIS — T148XXA Other injury of unspecified body region, initial encounter: Secondary | ICD-10-CM

## 2018-08-13 ENCOUNTER — Ambulatory Visit: Payer: Self-pay | Admitting: *Deleted

## 2018-08-13 ENCOUNTER — Other Ambulatory Visit: Payer: Self-pay

## 2018-08-13 ENCOUNTER — Ambulatory Visit: Payer: Self-pay | Admitting: Physician Assistant

## 2018-08-13 ENCOUNTER — Telehealth: Payer: Self-pay | Admitting: Family Medicine

## 2018-08-13 ENCOUNTER — Encounter: Payer: Self-pay | Admitting: Emergency Medicine

## 2018-08-13 ENCOUNTER — Emergency Department (INDEPENDENT_AMBULATORY_CARE_PROVIDER_SITE_OTHER)
Admission: EM | Admit: 2018-08-13 | Discharge: 2018-08-13 | Disposition: A | Discharge status: Home / Self Care | Payer: BC Managed Care – PPO | Source: Home / Self Care

## 2018-08-13 ENCOUNTER — Emergency Department (INDEPENDENT_AMBULATORY_CARE_PROVIDER_SITE_OTHER): Payer: BC Managed Care – PPO

## 2018-08-13 DIAGNOSIS — M898X1 Other specified disorders of bone, shoulder: Secondary | ICD-10-CM

## 2018-08-13 DIAGNOSIS — M546 Pain in thoracic spine: Secondary | ICD-10-CM

## 2018-08-13 DIAGNOSIS — M25512 Pain in left shoulder: Secondary | ICD-10-CM | POA: Diagnosis not present

## 2018-08-13 MED ORDER — METHOCARBAMOL 500 MG PO TABS: 500.0000 mg | ORAL_TABLET | Freq: Two times a day (BID) | ORAL | 0 refills | Status: DC

## 2018-08-13 MED ORDER — PREDNISONE 50 MG PO TABS: 50.0000 mg | ORAL_TABLET | Freq: Every day | ORAL | 0 refills | Status: AC

## 2018-08-13 NOTE — Telephone Encounter (Signed)
Pt reports left sided mid back pain, shoulder, neck, "Entire left side pain" Reports "Like muscle spasms." Onset 1 week ago.  Rates at 5-6/10 at rest, "12/10 when sneezing or coughing." Increased pain with deep breathing. Does not recall any recent injury. Does report mild nausea at times "From the pain." States taking baclofen which is no longer effective.  has been going to massage therapist, "Nothing helping."  Pt sounds distressed. Advised ED, declined. [place] called practice, spoke to [facility], instructed to route encounter for Dr. Alcario Drought review. Pt made aware practice would CB for consideration of appt.  CB# [phone] [id number]  Reason for Disposition . [1] SEVERE back pain (e.g., excruciating, unable to do any normal activities) AND [2] not improved 2 hours after pain medicine  Answer Assessment - Initial Assessment Questions 1. ONSET: "When did the pain begin?"      1 week ago 2. LOCATION: "Where does it hurt?" (upper, mid or lower back)     Left sided, mid back to left shoulder, neck, "Entire side to sternum" 3. SEVERITY: "How bad is the pain?"  (e.g., Scale 1-10; mild, moderate, or severe)   - MILD (1-3): doesn't interfere with normal activities    - MODERATE (4-7): interferes with normal activities or awakens from sleep    - SEVERE (8-10): excruciating pain, unable to do any normal activities      5-6/10 at rest, 12/10 with sneezing, coughing 4. PATTERN: "Is the pain constant?" (e.g., yes, no; constant, intermittent)      Varies "Spasm" 5. RADIATION: "Does the pain shoot into your legs or elsewhere?"     6. CAUSE:  "What do you think is causing the back pain?"      Muscle spasms 7. BACK OVERUSE:  "Any recent lifting of heavy objects, strenuous work or exercise?"    no 8. MEDICATIONS: "What have you taken so far for the pain?" (e.g., nothing, acetaminophen, NSAIDS)     Baclofen, was helping , no longer effectice 9. NEUROLOGIC SYMPTOMS: "Do you have any weakness, numbness, or problems with  bowel/bladder control?"     no 10. OTHER SYMPTOMS: "Do you have any other symptoms?" (e.g., fever, abdominal pain, burning with urination, blood in urine)      Nausea with the pain at times  Protocols used: BACK PAIN-A-AH

## 2018-08-13 NOTE — Telephone Encounter (Signed)
Reviewed with Dr. Juleen China. Patient was called and informed that he needed to have a evaluation sooner than we could offer. He should go to walk in or ED. Patient agreed and states will go today.

## 2018-08-13 NOTE — Telephone Encounter (Signed)
I was asked to call patient. Patient was upset and wanted to speak to the manager. He was upset because he had been told that he could not come in to the office due to sneezing and coughing documented in triage note. I explained to the patient that we are not seeing patients in the office with any of those symptoms to help keep everyone safe and due to not having a lot of appropriate PPE in office. He stated that he was upset and may need to change providers even though he had a great deal of respect for Dr. Juleen China. He said that he has a high deductible plan and that it would cost him a small fortune to go to the ER. I told him that I didn't mind talking to Dr. Juleen China to see if there were any other care options. I also let him know that the answer remain the same. I asked him to give me details on what was going on with his symptoms. He said that last Wednesday he sneezed after completing some detailed cleaning at his work. He said that he felt pain in between his left shoulder and spine. The pain increases when he moves that shoulder.  He said that over time it has increased and has worked around to his collarbone. He stated that muscle relaxers were helping to begin with and only helps now if he takes 3-4 daily. He also stated that the place between his left shoulder blade and spine has gone numb a few times. I let him know that I would discuss this with Dr. Juleen China and that he would receive a call back.

## 2018-08-13 NOTE — Telephone Encounter (Signed)
See other message

## 2018-08-13 NOTE — ED Provider Notes (Signed)
[name]    CSN: [id number] Arrival date & time: 08/13/18  1509     History   Chief Complaint Chief Complaint  Patient presents with  . Shoulder Pain    HPI Robert Gamble is a 43 y.o. male.   HPI Robert Gamble is a 43 y.o. male presenting to UC with c/o Left posterior shoulder pain that started 1 week ago, gradually becoming more constant and severe.  Pain is aching and sore, occasionally sharp and radiates around to Left upper chest.  No known injury.  Pt has had back spasms in the past so he has been taking leftover Baclofen and ibuprofen w/o relief.    Past Medical History:  Diagnosis Date  . Allergic rhinitis   . Chronic headache   . DM2 (diabetes mellitus, type 2) ([place])   . HTN (hypertension)   . OSA on CPAP     Patient Active Problem List   Diagnosis Date Noted  . Snoring 07/29/2018  . OSA (obstructive sleep apnea) 07/29/2018  . Other insomnia 07/29/2018  . Class 3 severe obesity due to excess calories with serious comorbidity and body mass index (BMI) of 40.0 to 44.9 in adult ([place]) 07/29/2018  . Excessive daytime sleepiness 07/29/2018  . HTN, goal below 130/80 07/29/2018  . Hyperlipidemia associated with type 2 diabetes mellitus ([place]) 10/07/2017  . Morbid obesity ([place]) 06/17/2017  . Migraines, prn Imitrex and Naproxen 07/27/2016  . Type 2 diabetes mellitus without complication, on Metformin 07/02/2016  . Chronic pain of both knees 03/22/2010  . Hypertension associated with diabetes ([place]), on Losartan, HCTZ, Verapamil 04/12/2009  . Allergic rhinitis 03/11/2009  . Hypersomnia with sleep apnea 03/04/2009    Past Surgical History:  Procedure Laterality Date  . APPENDECTOMY         Home Medications    Prior to Admission medications   Medication Sig Start Date End Date Taking? Authorizing Provider  buPROPion (WELLBUTRIN XL) 150 MG 24 hr tablet TAKE 1 TABLET BY MOUTH EVERY DAY 07/24/18   Briscoe Deutscher, DO  escitalopram  (LEXAPRO) 10 MG tablet TAKE 1 TABLET BY MOUTH EVERY DAY 08/06/18   Briscoe Deutscher, DO  loratadine (CLARITIN) 10 MG tablet Take 10 mg by mouth daily.    [provider]  losartan-hydrochlorothiazide (HYZAAR) 100-25 MG tablet Take 1 tablet by mouth daily. 07/23/18   Inda Coke, PA  MELATONIN PO Take by mouth 3 (three) times a week.    [provider]  metFORMIN (GLUCOPHAGE) 500 MG tablet TAKE 1 TABLET (500 MG TOTAL) BY MOUTH 2 (TWO) TIMES DAILY WITH A MEAL. 10/15/17   Briscoe Deutscher, DO  methocarbamol (ROBAXIN) 500 MG tablet Take 1 tablet (500 mg total) by mouth 2 (two) times daily. 08/13/18   Noe Gens, PA-C  Multiple Vitamin (MULTIVITAMIN) capsule Take 1 capsule by mouth daily.    [provider]  naproxen (NAPROSYN) 500 MG tablet  09/19/16   [provider]  predniSONE (DELTASONE) 50 MG tablet Take 1 tablet (50 mg total) by mouth daily with breakfast for 5 days. 08/13/18 08/18/18  Noe Gens, PA-C  SUMAtriptan (IMITREX) 100 MG tablet TAKE 1 TAB AS NEEDWED FOR MIGRAINE, REPEAT IN 2 HRS IF PERSISTS OR RECURS 08/06/18   Briscoe Deutscher, DO  traZODone (DESYREL) 100 MG tablet Take 1 tablet (100 mg total) by mouth at bedtime. Patient taking differently: Take 50 mg by mouth at bedtime.  04/08/18   Briscoe Deutscher, DO  traZODone (DESYREL) 50 MG  tablet TAKE 0.5-1 TABLETS (25-50 MG TOTAL) BY MOUTH AT BEDTIME AS NEEDED FOR SLEEP. 07/24/18   Briscoe Deutscher, DO  verapamil (CALAN) 120 MG tablet TAKE 1 TABLET (120 MG TOTAL) BY MOUTH 3 (THREE) TIMES DAILY. 07/16/18   Briscoe Deutscher, DO  Phentermine-Topiramate 7.5-46 MG CP24 Take 1 capsule by mouth daily. Patient not taking: Reported on 07/12/2016 07/02/16 07/12/16  Briscoe Deutscher, DO    Family History Family History  Problem Relation Age of Onset  . Hypertension Father   . Sleep apnea Sister   . Diabetes Sister   . Hyperlipidemia Sister   . Hypertension Sister   . Mental illness Sister   . Lung cancer Other   . Clotting  disorder Other   . Emphysema Other   . Heart disease Other   . Cancer Maternal Grandmother   . Cancer Maternal Grandfather     Social History Social History   Tobacco Use  . Smoking status: Former Research scientist (life sciences)  . Smokeless tobacco: Never Used  Substance Use Topics  . Alcohol use: No  . Drug use: No     Allergies   Pregabalin, Phentermine, Ramipril, Statins, Zocor [simvastatin], and Penicillins   Review of Systems Review of Systems  Musculoskeletal: Positive for arthralgias, back pain and myalgias. Negative for neck pain and neck stiffness.  Skin: Negative for color change and wound.  Neurological: Negative for weakness and numbness.     Physical Exam Triage Vital Signs ED Triage Vitals  Enc Vitals Group     BP      Pulse      Resp      Temp      Temp src      SpO2      Weight      Height      Head Circumference      Peak Flow      Pain Score      Pain Loc      Pain Edu?      Excl. in [place]?    No data found.  Updated Vital Signs BP (!) 160/94 (BP Location: Right Arm)   Pulse 77   Temp 98.3 F (36.8 C) (Oral)   Ht 5\' 9"  (1.753 m)   Wt 275 lb (124.7 kg)   SpO2 98%   BMI 40.61 kg/m   Visual Acuity Right Eye Distance:   Left Eye Distance:   Bilateral Distance:    Right Eye Near:   Left Eye Near:    Bilateral Near:     Physical Exam Vitals signs and nursing note reviewed.  Constitutional:      Appearance: Normal appearance. He is well-developed.  HENT:     Head: Normocephalic and atraumatic.  Neck:     Musculoskeletal: Normal range of motion and neck supple. No neck rigidity or muscular tenderness.  Cardiovascular:     Rate and Rhythm: Normal rate and regular rhythm.     Pulses:          Radial pulses are 2+ on the left side.  Pulmonary:     Effort: Pulmonary effort is normal.     Breath sounds: Normal breath sounds.    Chest:     Chest wall: Tenderness present.    Musculoskeletal: Normal range of motion.        General: Tenderness  present.     Comments: Tenderness to mid thoracic spine and along Left paraspinal muscles.  Tenderness to medial and inferior border of scapula.  Full ROM  Left shoulder. 5/5 strength Left elbow: non-tender. Full ROM  Skin:    General: Skin is warm and dry.     Capillary Refill: Capillary refill takes less than 2 seconds.  Neurological:     Mental Status: He is alert and oriented to person, place, and time.  Psychiatric:        Behavior: Behavior normal.      UC Treatments / Results  Labs (all labs ordered are listed, but only abnormal results are displayed) Labs Reviewed - No data to display  EKG None  Radiology Dg Thoracic Spine W/swimmers  Result Date: 08/13/2018 CLINICAL DATA:  Left scapula pain EXAM: THORACIC SPINE - 3 VIEWS COMPARISON:  None. FINDINGS: There is no evidence of thoracic spine fracture. Alignment is normal. No other significant bone abnormalities are identified. IMPRESSION: Negative. Electronically Signed   By: Ulyses Jarred M.D.   On: 08/13/2018 16:23   Dg Shoulder Left  Result Date: 08/13/2018 CLINICAL DATA:  Left shoulder pain EXAM: LEFT SHOULDER - 2+ VIEW COMPARISON:  None. FINDINGS: There is no evidence of fracture or dislocation. There is no evidence of arthropathy or other focal bone abnormality. Soft tissues are unremarkable. IMPRESSION: Negative. Electronically Signed   By: Ulyses Jarred M.D.   On: 08/13/2018 16:21    Procedures Procedures (including critical care time)  Medications Ordered in UC Medications - No data to display  Initial Impression / Assessment and Plan / UC Course  I have reviewed the triage vital signs and the nursing notes.  Pertinent labs & imaging results that were available during my care of the patient were reviewed by me and considered in my medical decision making (see chart for details).     Reviewed imaging with pt. Will tx as muscle strain F/u with orthopedist he currently sees for his knees. AVS provided   Final Clinical Impressions(s) / UC Diagnoses   Final diagnoses:  Pain of left scapula  Acute left-sided thoracic back pain     Discharge Instructions      You have been prescribed prednisone, an oral steroid to help with muscle pain and swelling.   This medication might make it hard to sleep at night so you may want to take it in the morning.   The prednisone can also cause your blood sugar and blood pressure to elevate, please monitor your blood sugar and blood pressure while taking this medication to make sure it does not get out of recommended (by your family doctor) range.  Please follow up with family medicine or orthopedist in 1 week if not improving.     ED Prescriptions    Medication Sig Dispense Auth. Provider   methocarbamol (ROBAXIN) 500 MG tablet Take 1 tablet (500 mg total) by mouth 2 (two) times daily. 20 tablet Gerarda Fraction, Howie Rufus O, PA-C   predniSONE (DELTASONE) 50 MG tablet Take 1 tablet (50 mg total) by mouth daily with breakfast for 5 days. 5 tablet Noe Gens, PA-C     Controlled Substance Prescriptions [place] Controlled Substance Registry consulted? Not Applicable   Tyrell Antonio 08/14/18 0254

## 2018-08-13 NOTE — Discharge Instructions (Signed)
°  You have been prescribed prednisone, an oral steroid to help with muscle pain and swelling.   This medication might make it hard to sleep at night so you may want to take it in the morning.   The prednisone can also cause your blood sugar and blood pressure to elevate, please monitor your blood sugar and blood pressure while taking this medication to make sure it does not get out of recommended (by your family doctor) range.  Please follow up with family medicine or orthopedist in 1 week if not improving.

## 2018-08-13 NOTE — ED Triage Notes (Signed)
LT Shoulder pain x 1 week, denies injury, constant 5, with movement 8-10

## 2018-08-14 ENCOUNTER — Other Ambulatory Visit: Payer: Self-pay | Admitting: Physician Assistant

## 2018-08-15 ENCOUNTER — Ambulatory Visit (INDEPENDENT_AMBULATORY_CARE_PROVIDER_SITE_OTHER): Payer: BC Managed Care – PPO | Admitting: Orthopaedic Surgery

## 2018-08-15 ENCOUNTER — Other Ambulatory Visit: Payer: Self-pay

## 2018-08-15 ENCOUNTER — Encounter: Payer: Self-pay | Admitting: Orthopaedic Surgery

## 2018-08-15 VITALS — Ht 69.0 in | Wt 275.0 lb

## 2018-08-15 DIAGNOSIS — M1711 Unilateral primary osteoarthritis, right knee: Secondary | ICD-10-CM

## 2018-08-15 MED ORDER — LIDOCAINE HCL 1 % IJ SOLN
2.0000 mL | INTRAMUSCULAR | Status: AC | PRN
  Administered 2018-08-15: 2 mL

## 2018-08-15 MED ORDER — BUPIVACAINE HCL 0.5 % IJ SOLN
2.0000 mL | INTRAMUSCULAR | Status: AC | PRN
  Administered 2018-08-15: 2 mL via INTRA_ARTICULAR

## 2018-08-15 MED ORDER — METHYLPREDNISOLONE ACETATE 40 MG/ML IJ SUSP
40.0000 mg | INTRAMUSCULAR | Status: AC | PRN
  Administered 2018-08-15: 40 mg via INTRA_ARTICULAR

## 2018-08-15 NOTE — Progress Notes (Signed)
Office Visit Note   Patient: Robert Gamble           Date of Birth: [birth year]           MRN: [id number] Visit Date: 08/15/2018              Requested by: Robert Gamble, [address] [place],  [place] [address] PCP: Robert Deutscher, DO   Assessment & Plan: Visit Diagnoses:  1. Primary osteoarthritis of right knee     Plan: Right knee injection performed today.  Patient tolerated this well.  We will see him back as needed.  He will call us if he would like to try [place] supplementation in the future.  Follow-Up Instructions: Return if symptoms worsen or fail to improve.   Orders:  No orders of the defined types were placed in this encounter.  No orders of the defined types were placed in this encounter.     Procedures: Large Joint Inj: R knee on 08/15/2018 8:19 AM Indications: pain Details: 22 G needle  Arthrogram: No  Medications: 40 mg methylPREDNISolone acetate 40 MG/ML; 2 mL lidocaine 1 %; 2 mL bupivacaine 0.5 % Consent was given by the patient. Patient was prepped and draped in the usual sterile fashion.       Clinical Data: No additional findings.   Subjective: Chief Complaint  Patient presents with  . Right Knee - Pain, Follow-up    [name] is here for his right knee injection.  He got some partial relief from the left knee injection.  Otherwise he is doing well.  His fingers did not really increase significantly.   Review of Systems   Objective: Vital Signs: Ht 5\' 9"  (1.753 m)   Wt 275 lb (124.7 kg)   BMI 40.61 kg/m   Physical Exam  Ortho Exam Right knee exam is stable and unchanged. Specialty Comments:  No specialty comments available.  Imaging: No results found.   PMFS History: Patient Active Problem List   Diagnosis Date Noted  . Snoring 07/29/2018  . OSA (obstructive sleep apnea) 07/29/2018  . Other insomnia 07/29/2018  . Class 3 severe obesity due to excess calories with serious comorbidity and body mass index  (BMI) of 40.0 to 44.9 in adult ([place]) 07/29/2018  . Excessive daytime sleepiness 07/29/2018  . HTN, goal below 130/80 07/29/2018  . Hyperlipidemia associated with type 2 diabetes mellitus ([place]) 10/07/2017  . Morbid obesity ([place]) 06/17/2017  . Migraines, prn Imitrex and Naproxen 07/27/2016  . Type 2 diabetes mellitus without complication, on Metformin 07/02/2016  . Chronic pain of both knees 03/22/2010  . Hypertension associated with diabetes ([place]), on Losartan, HCTZ, Verapamil 04/12/2009  . Allergic rhinitis 03/11/2009  . Hypersomnia with sleep apnea 03/04/2009   Past Medical History:  Diagnosis Date  . Allergic rhinitis   . Chronic headache   . DM2 (diabetes mellitus, type 2) ([place])   . HTN (hypertension)   . OSA on CPAP     Family History  Problem Relation Age of Onset  . Hypertension Father   . Sleep apnea Sister   . Diabetes Sister   . Hyperlipidemia Sister   . Hypertension Sister   . Mental illness Sister   . Lung cancer Other   . Clotting disorder Other   . Emphysema Other   . Heart disease Other   . Cancer Maternal Grandmother   . Cancer Maternal Grandfather     Past Surgical History:  Procedure Laterality Date  . APPENDECTOMY  Social History   Occupational History  . Not on file  Tobacco Use  . Smoking status: Former Research scientist (life sciences)  . Smokeless tobacco: Never Used  Substance and Sexual Activity  . Alcohol use: No  . Drug use: No  . Sexual activity: Yes    Birth control/protection: Rhythm

## 2018-08-16 ENCOUNTER — Other Ambulatory Visit: Payer: Self-pay | Admitting: Family Medicine

## 2018-08-16 DIAGNOSIS — I10 Essential (primary) hypertension: Secondary | ICD-10-CM

## 2018-08-31 ENCOUNTER — Other Ambulatory Visit: Payer: Self-pay | Admitting: Family Medicine

## 2018-08-31 DIAGNOSIS — G43909 Migraine, unspecified, not intractable, without status migrainosus: Secondary | ICD-10-CM

## 2018-09-19 ENCOUNTER — Other Ambulatory Visit: Payer: Self-pay

## 2018-09-19 ENCOUNTER — Ambulatory Visit (INDEPENDENT_AMBULATORY_CARE_PROVIDER_SITE_OTHER): Payer: BC Managed Care – PPO | Admitting: Neurology

## 2018-09-19 DIAGNOSIS — I10 Essential (primary) hypertension: Secondary | ICD-10-CM

## 2018-09-19 DIAGNOSIS — IMO0002 Reserved for concepts with insufficient information to code with codable children: Secondary | ICD-10-CM

## 2018-09-19 DIAGNOSIS — G4709 Other insomnia: Secondary | ICD-10-CM

## 2018-09-19 DIAGNOSIS — G4733 Obstructive sleep apnea (adult) (pediatric): Secondary | ICD-10-CM | POA: Diagnosis not present

## 2018-09-19 DIAGNOSIS — G4736 Sleep related hypoventilation in conditions classified elsewhere: Secondary | ICD-10-CM

## 2018-09-19 DIAGNOSIS — G4719 Other hypersomnia: Secondary | ICD-10-CM

## 2018-09-19 DIAGNOSIS — E119 Type 2 diabetes mellitus without complications: Secondary | ICD-10-CM

## 2018-09-22 NOTE — Addendum Note (Signed)
Addended by: Larey Seat on: 09/22/2018 04:26 PM   Modules accepted: Orders

## 2018-09-22 NOTE — Procedures (Signed)
PATIENT'S NAME:  Robert Gamble, Robert Gamble DOB:      [birth year]      MR#:    [id number]     DATE OF RECORDING: 09/19/2018 REFERRING M.D.:  Briscoe Deutscher, DO Study Performed:   Baseline Polysomnogram HISTORY:  A virtual visit note from 07-29-2018: Robert Gamble is a 43 year old Caucasian gentleman whose current chief complaint is insufficient sleep- over the last 6 months he got on average reportedly only 3 to 4 hours of nocturnal sleep.  This all started before the coronavirus lockdown.  He reports that he had a sleep study 7 years ago at [facility] where he was diagnosed with obstructive sleep apnea and prescribed CPAP.  Within the next 24 months he lost 100 pounds and felt or was told that he does not need CPAP anymore.  Unfortunately he has gained his weight back he reports a current weight of 272 pounds at a height of 5 foot 9 inches.  His wife has started to hear him snore loudly again, and he struggles with daytime sleepiness and fatigue.  Allergic rhinitis, Chronic headache, DM2, HTN, OSA, Anxiety, Depression The patient endorsed the Epworth Sleepiness Scale at 10-12 points.   The patient's weight 272 pounds with a height of 69 (inches), resulting in a BMI of 40.2 kg/m2. The patient's neck circumference measured 19 inches.  CURRENT MEDICATIONS: Lioresal, Wellbutrin XL, Lexapro, Claritin, Hyzaar, Melatonin, Glucophage, Multivitamin, Naproxen, Imitrex, Desyrel, Calan   PROCEDURE:  This is a multichannel digital polysomnogram utilizing the Somnostar 11.2 system.  Electrodes and sensors were applied and monitored per AASM Specifications.   EEG, EOG, Chin and Limb EMG, were sampled at 200 Hz.  ECG, Snore and Nasal Pressure, Thermal Airflow, Respiratory Effort, CPAP Flow and Pressure, Oximetry was sampled at 50 Hz. Digital video and audio were recorded.      BASELINE STUDY: Lights Out was at 21:14 and Lights On at 05:00.  Total recording time (TRT) was 467 minutes, with a total sleep time (TST) of 432  minutes.   The patient's sleep latency was 6.5 minutes.  REM latency was 309.5 minutes.  The sleep efficiency was 92.5 %.     SLEEP ARCHITECTURE: WASO (Wake after sleep onset) was 28.5 minutes. There were 29.5 minutes in Stage N1, 335.5 minutes Stage N2, 28.5 minutes Stage N3 and 38.5 minutes in Stage REM.  The percentage of Stage N1 was 6.8%, Stage N2 was 77.7%, Stage N3 was 6.6% and Stage R (REM sleep) was 8.9%.   RESPIRATORY ANALYSIS:  There were a total of 59 respiratory events:  1 obstructive apnea, 0 central apneas and 0 mixed apneas with 58 hypopneas.     The total APNEA/HYPOPNEA INDEX (AHI) was 8.2 /hour and the total RESPIRATORY DISTURBANCE INDEX was 8.2 /hour.  19 events occurred in REM sleep and 80 events in NREM.  The REM AHI was 29.6 /hour, versus a non-REM AHI of 6.1. The patient spent 304 minutes of total sleep time in the supine position and 128 minutes in non-supine. The supine AHI was 8.7/h. versus a non-supine AHI of 7.0.  OXYGEN SATURATION & C02:  The Wake baseline 02 saturation was 94%, with the lowest being 73%. Time spent below 89% saturation equaled 61 minutes.    The patient had a total of 17 Periodic Limb Movements.  The Periodic Limb Movement (PLM) index was 2.4 and the PLM Arousal index was 0/hour. The arousals were noted as: 31 were spontaneous, 0 were associated with PLMs, and another 34 were associated  with respiratory events. Audio and video analysis did not show any abnormal or unusual movements, behaviors, phonations or vocalizations.   The patient took one bathroom break. Moderate Snoring was noted. EKG was in keeping with bradycardic sinus rhythm (SR). Post-study, the patient indicated that sleep was the same as usual.   IMPRESSION:  1. Mild Obstructive Sleep Apnea (OSA) but prolonged hypoxemia of sleep.  2. UARS- Snoring,  3. Bradycardia in regular rhythm on EKG  RECOMMENDATIONS:  1. Advise full-night, attended, CPAP titration study to optimize therapy.  only this way can we treat apnea first and treat hypoxemia secondarily if not already corrected with PAP.      I certify that I have reviewed the entire raw data recording prior to the issuance of this report in accordance with the Standards of Accreditation of the [place] Academy of Sleep Medicine (AASM)    Larey Seat, MD   09-22-2018  Diplomat, American Board of Psychiatry and Neurology  Diplomat, American Board of [place] Director, [place] [facility] [employer]

## 2018-09-23 ENCOUNTER — Telehealth: Payer: Self-pay

## 2018-09-23 NOTE — Telephone Encounter (Signed)
-----   Message from Larey Seat, MD sent at 09/22/2018  4:26 PM EDT ----- IMPRESSION:   1. Mild Obstructive Sleep Apnea (OSA) but prolonged hypoxemia of  sleep.  2. UARS- Snoring,  3. Bradycardia in regular rhythm on EKG   RECOMMENDATIONS:   1. Advise full-night, attended, CPAP titration study to optimize  therapy. only this way can we treat apnea first and treat  hypoxemia secondarily if not already corrected with PAP.

## 2018-09-23 NOTE — Telephone Encounter (Signed)
I called pt to discuss his sleep study results. No answer, left a message asking him to call me back. 

## 2018-09-24 ENCOUNTER — Encounter: Payer: Self-pay | Admitting: Neurology

## 2018-09-25 ENCOUNTER — Other Ambulatory Visit: Payer: Self-pay | Admitting: Family Medicine

## 2018-09-25 DIAGNOSIS — G43909 Migraine, unspecified, not intractable, without status migrainosus: Secondary | ICD-10-CM

## 2018-09-29 NOTE — Telephone Encounter (Signed)
I called pt. I advised pt that Dr. Brett Fairy reviewed their sleep study results and found that pt has mild osa with prolonged hypoxemia and recommends that pt be treated with a cpap. Dr. Brett Fairy recommends that pt return for a repeat sleep study in order to properly titrate the cpap and ensure a good mask fit. Pt is agreeable to returning for a titration study. I advised pt that our sleep lab will file with pt's insurance and call pt to schedule the sleep study when we hear back from the pt's insurance regarding coverage of this sleep study. Pt verbalized understanding of results. Pt had no questions at this time but was encouraged to call back if questions arise.

## 2018-09-29 NOTE — Telephone Encounter (Signed)
Pt has called RN Kristen back, he is asking for a call back

## 2018-10-13 ENCOUNTER — Other Ambulatory Visit (HOSPITAL_COMMUNITY)
Admission: RE | Admit: 2018-10-13 | Discharge: 2018-10-13 | Disposition: A | Discharge status: Home / Self Care | Payer: BC Managed Care – PPO | Source: Ambulatory Visit | Attending: Neurology | Admitting: Neurology

## 2018-10-13 DIAGNOSIS — Z20828 Contact with and (suspected) exposure to other viral communicable diseases: Secondary | ICD-10-CM | POA: Insufficient documentation

## 2018-10-13 DIAGNOSIS — Z01812 Encounter for preprocedural laboratory examination: Secondary | ICD-10-CM | POA: Insufficient documentation

## 2018-10-13 LAB — SARS CORONAVIRUS 2 (TAT 6-24 HRS): SARS Coronavirus 2: NEGATIVE

## 2018-10-14 ENCOUNTER — Ambulatory Visit (INDEPENDENT_AMBULATORY_CARE_PROVIDER_SITE_OTHER): Payer: BC Managed Care – PPO | Admitting: Neurology

## 2018-10-14 DIAGNOSIS — G4709 Other insomnia: Secondary | ICD-10-CM

## 2018-10-14 DIAGNOSIS — G4719 Other hypersomnia: Secondary | ICD-10-CM

## 2018-10-14 DIAGNOSIS — I10 Essential (primary) hypertension: Secondary | ICD-10-CM

## 2018-10-14 DIAGNOSIS — G4736 Sleep related hypoventilation in conditions classified elsewhere: Secondary | ICD-10-CM

## 2018-10-14 DIAGNOSIS — IMO0002 Reserved for concepts with insufficient information to code with codable children: Secondary | ICD-10-CM

## 2018-10-14 DIAGNOSIS — G4733 Obstructive sleep apnea (adult) (pediatric): Secondary | ICD-10-CM

## 2018-10-14 DIAGNOSIS — E119 Type 2 diabetes mellitus without complications: Secondary | ICD-10-CM

## 2018-10-14 DIAGNOSIS — R0902 Hypoxemia: Secondary | ICD-10-CM

## 2018-10-19 ENCOUNTER — Other Ambulatory Visit: Payer: Self-pay | Admitting: Neurology

## 2018-10-19 DIAGNOSIS — G478 Other sleep disorders: Secondary | ICD-10-CM

## 2018-10-19 DIAGNOSIS — IMO0002 Reserved for concepts with insufficient information to code with codable children: Secondary | ICD-10-CM | POA: Insufficient documentation

## 2018-10-19 DIAGNOSIS — G4710 Hypersomnia, unspecified: Secondary | ICD-10-CM

## 2018-10-19 DIAGNOSIS — I152 Hypertension secondary to endocrine disorders: Secondary | ICD-10-CM

## 2018-10-19 DIAGNOSIS — G4736 Sleep related hypoventilation in conditions classified elsewhere: Secondary | ICD-10-CM | POA: Insufficient documentation

## 2018-10-19 DIAGNOSIS — G4733 Obstructive sleep apnea (adult) (pediatric): Secondary | ICD-10-CM

## 2018-10-19 DIAGNOSIS — R0683 Snoring: Secondary | ICD-10-CM

## 2018-10-19 DIAGNOSIS — G4734 Idiopathic sleep related nonobstructive alveolar hypoventilation: Secondary | ICD-10-CM

## 2018-10-19 DIAGNOSIS — E1159 Type 2 diabetes mellitus with other circulatory complications: Secondary | ICD-10-CM

## 2018-10-19 NOTE — Progress Notes (Signed)
PATIENT'S NAME:  Robert Gamble, Robert Gamble DOB:      1975-07-31      MR#:    [id number]     DATE OF RECORDING: 10/14/2018 [place] REFERRING M.D.:  Briscoe Deutscher, DO  Study Performed:   Titration to positive airway pressure HISTORY:  Robert Gamble is a 43 year old patient, returning for CPAP titration following his PSG study performed on 09/19/18. The results were an AHI of 8.2/h ( Diagnosis of mild apnea, bradycardia and UARS) and Spo2 nadir of 73%.Reason for the return is a prolonged total desaturation time of 91minutes.   The patient endorsed the Epworth Sleepiness Scale at 12 points.   The patient's weight 271 pounds with a height of 69 (inches), resulting in a BMI of 40.2 kg/m2. The patient's neck circumference measured 19 inches.  CURRENT MEDICATIONS: Lioresal, Wellbutrin XL, Lexapro, Claritin, Hyzaar, Melatonin, Glucophage, Multivitamin, Naproxen, Imitrex, Desyrel, Calan   PROCEDURE:  This is a multichannel digital polysomnogram utilizing the SomnoStar 11.2 system.  Electrodes and sensors were applied and monitored per AASM Specifications.   EEG, EOG, Chin and Limb EMG, were sampled at 200 Hz.  ECG, Snore and Nasal Pressure, Thermal Airflow, Respiratory Effort, CPAP Flow and Pressure, Oximetry was sampled at 50 Hz. Digital video and audio were recorded.      The patient was fitted with a ResMed P 30i medium interface. CPAP was initiated at 5 cmH20 with heated humidity per AASM split night standards and pressure was advanced to 11 cmH20 because of hypopneas, apneas and desaturations.  At a PAP pressure of 11 cmH20, there was a sleep efficiency of 97%, a reduction of the AHI to 1.6/h with a nadir of 84% in REM sleep.  Lights Out was at 20:57 and Lights On at 04:59. Total recording time (TRT) was 483 minutes, with a total sleep time (TST) of 420.5 minutes. The patient's sleep latency was 34 minutes. REM latency was 186.5 minutes.  The sleep efficiency was 87.1 %.    SLEEP ARCHITECTURE: WASO (Wake  after sleep onset) was 28.5 minutes.  There were 29 minutes in Stage N1, 143 minutes Stage N2, 125 minutes Stage N3 and 123.5 minutes in Stage REM.  The percentage of Stage N1 was 6.9%, Stage N2 was 34.%, Stage N3 was 29.7% and Stage R (REM sleep) was 29.4%.The sleep architecture was notable for REM rebound. RESPIRATORY ANALYSIS:  There was a total of 8 respiratory events: 8 hypopneas with a hypopnea index of 1.1/hour. There was RERAs, additional arousals from UARS in REM sleep.  The total APNEA/HYPOPNEA INDEX (AHI) was 1.1 /hour.7 events occurred in REM sleep and 1 events in NREM. The REM AHI was 3.4 /hour versus a non-REM AHI of .2 /hour.  The patient spent 155 minutes of total sleep time in the supine position and 266 minutes in non-supine. The supine AHI was 1.9, versus a non-supine AHI of 0.7.  OXYGEN SATURATION & C02:  The baseline 02 saturation was 94%, with the lowest being 81%. Time spent below 89% saturation equaled 53 minutes.  The arousals were noted as: 46 were spontaneous, 0 were associated with PLMs, and 0 were associated with respiratory events. Hypoxemia preceded some of the "spontaneous arousals".  The patient had a total of 0 Periodic Limb Movements.   Audio and video analysis did not show any abnormal or unusual movements, behaviors, phonations or vocalizations.   EKG was in keeping with normal sinus rhythm (NSR), rare PACs.  Post-study, the patient indicated that sleep  was the same as usual.  DIAGNOSIS 1. Obstructive Sleep Apnea, Sleep Related Hypoxemia, and Upper Airway Resistance Syndrome. 2. All above improved under CPAP of 11 cm water with the use of a nasal pillow P 30 I , by ResMed, in medium size.      3.  DISCUSSION: autotitration capable CPAP with heated humidity will be ordered and a pressure window of 7 through 14 cm water set, with an EPR of 2 cm water.  Prescribed use of a nasal pillow P 30 I, by ResMed, in medium size.      A follow up appointment will be  scheduled in the Sleep Clinic at [facility].   Please call [phone] with any questions.      I certify that I have reviewed the entire raw data recording prior to the issuance of this report in accordance with the Standards of Accreditation of the American Academy of Sleep Medicine (AASM)      Larey Seat, M.D.   10-18-2018 Diplomat, American Board of Psychiatry and Neurology  Diplomat, [place] of Sleep Medicine Medical Director, [place] [facility]

## 2018-10-19 NOTE — Addendum Note (Signed)
Addended by: Larey Seat on: 10/19/2018 03:41 PM   Modules accepted: Orders

## 2018-10-19 NOTE — Procedures (Signed)
PATIENT'S NAME:  Robert Gamble, Robert Gamble DOB:      1975-03-02      MR#:    [id number]     DATE OF RECORDING: 10/14/2018 AL REFERRING M.D.:  Briscoe Deutscher, DO  Study Performed:   Titration to positive airway pressure HISTORY:  Robert Gamble is a 43 year old patient, returning for CPAP titration following his PSG study performed on 09/19/18. The results were an AHI of 8.2/h ( Diagnosis of mild apnea, bradycardia and UARS) and Spo2 nadir of 73%.Reason for the return is a prolonged total desaturation time of 61minutes.   The patient endorsed the Epworth Sleepiness Scale at 12 points.   The patient's weight 271 pounds with a height of 69 (inches), resulting in a BMI of 40.2 kg/m2. The patient's neck circumference measured 19 inches.  CURRENT MEDICATIONS: Lioresal, Wellbutrin XL, Lexapro, Claritin, Hyzaar, Melatonin, Glucophage, Multivitamin, Naproxen, Imitrex, Desyrel, Calan   PROCEDURE:  This is a multichannel digital polysomnogram utilizing the SomnoStar 11.2 system.  Electrodes and sensors were applied and monitored per AASM Specifications.   EEG, EOG, Chin and Limb EMG, were sampled at 200 Hz.  ECG, Snore and Nasal Pressure, Thermal Airflow, Respiratory Effort, CPAP Flow and Pressure, Oximetry was sampled at 50 Hz. Digital video and audio were recorded.      The patient was fitted with a ResMed P 30i medium interface. CPAP was initiated at 5 cmH20 with heated humidity per AASM split night standards and pressure was advanced to 11 cmH20 because of hypopneas, apneas and desaturations.  At a PAP pressure of 11 cmH20, there was a sleep efficiency of 97%, a reduction of the AHI to 1.6/h with a nadir of 84% in REM sleep.  Lights Out was at 20:57 and Lights On at 04:59. Total recording time (TRT) was 483 minutes, with a total sleep time (TST) of 420.5 minutes. The patient's sleep latency was 34 minutes. REM latency was 186.5 minutes.  The sleep efficiency was 87.1 %.    SLEEP ARCHITECTURE: WASO (Wake  after sleep onset) was 28.5 minutes.  There were 29 minutes in Stage N1, 143 minutes Stage N2, 125 minutes Stage N3 and 123.5 minutes in Stage REM.  The percentage of Stage N1 was 6.9%, Stage N2 was 34.%, Stage N3 was 29.7% and Stage R (REM sleep) was 29.4%.The sleep architecture was notable for REM rebound. RESPIRATORY ANALYSIS:  There was a total of 8 respiratory events: 8 hypopneas with a hypopnea index of 1.1/hour. There was RERAs, additional arousals from UARS in REM sleep.  The total APNEA/HYPOPNEA INDEX (AHI) was 1.1 /hour.7 events occurred in REM sleep and 1 events in NREM. The REM AHI was 3.4 /hour versus a non-REM AHI of .2 /hour.  The patient spent 155 minutes of total sleep time in the supine position and 266 minutes in non-supine. The supine AHI was 1.9, versus a non-supine AHI of 0.7.  OXYGEN SATURATION & C02:  The baseline 02 saturation was 94%, with the lowest being 81%. Time spent below 89% saturation equaled 53 minutes.  The arousals were noted as: 46 were spontaneous, 0 were associated with PLMs, and 0 were associated with respiratory events. Hypoxemia preceded some of the "spontaneous arousals".  The patient had a total of 0 Periodic Limb Movements.   Audio and video analysis did not show any abnormal or unusual movements, behaviors, phonations or vocalizations.   EKG was in keeping with normal sinus rhythm (NSR), rare PACs.  Post-study, the patient indicated that sleep  was the same as usual.  DIAGNOSIS 1. Obstructive Sleep Apnea, Sleep Related Hypoxemia, and Upper Airway Resistance Syndrome. 2. All above improved under CPAP of 11 cm water with the use of a nasal pillow P 30 I , by ResMed, in medium size.      3.  DISCUSSION: autotitration capable CPAP with heated humidity will be ordered and a pressure window of 7 through 14 cm water set, with an EPR of 2 cm water.  Prescribed use of a nasal pillow P 30 I, by ResMed, in medium size.      A follow up appointment will be  scheduled in the Sleep Clinic at [facility].   Please call [phone] with any questions.      I certify that I have reviewed the entire raw data recording prior to the issuance of this report in accordance with the Standards of Accreditation of the American Academy of Sleep Medicine (AASM)      Larey Seat, M.D.   10-18-2018 Diplomat, American Board of Psychiatry and Neurology  Diplomat, [place] of [employer] Medical Director, [place] [facility]

## 2018-10-21 ENCOUNTER — Telehealth: Payer: Self-pay | Admitting: Neurology

## 2018-10-21 NOTE — Telephone Encounter (Signed)
-----   Message from Larey Seat, MD sent at 10/19/2018  3:41 PM EDT ----- 1. Obstructive Sleep Apnea, Sleep Related Hypoxemia, and Upper  Airway Resistance Syndrome.  2. All above improved under CPAP of 11 cm water with the use of a  nasal pillow P 30 I , by ResMed, in medium size.      3. DISCUSSION: autotitration capable CPAP with heated humidity  will be ordered and a pressure window of 7 through 14 cm water  set, with an EPR of 2 cm water. Prescribed use of a nasal pillow  P 30 I, by ResMed, in medium size.

## 2018-10-21 NOTE — Telephone Encounter (Signed)
I called pt. I advised pt that Dr. Brett Fairy reviewed their sleep study results and found that pt has sleep apnea. Dr. Brett Fairy recommends that pt was treated best at a pressure of 11 cm. She will set him up with auto CPAP. I reviewed PAP compliance expectations with the pt. Pt is agreeable to starting a CPAP. I advised pt that an order will be sent to a DME, [facility], and [facility] will call the pt within about one week after they file with the pt's insurance. [facility] will show the pt how to use the machine, fit for masks, and troubleshoot the CPAP if needed. A follow up appt was made for insurance purposes with Debbora Presto, NP on Nov 5,2020 at 7:30 am. Pt verbalized understanding to arrive 15 minutes early and bring their CPAP. A letter with all of this information in it will be mailed to the pt as a reminder. I verified with the pt that the address we have on file is correct. Pt verbalized understanding of results. Pt had no questions at this time but was encouraged to call back if questions arise. I have sent the order to [facility] and have received confirmation that they have received the order.

## 2018-10-27 DIAGNOSIS — G4733 Obstructive sleep apnea (adult) (pediatric): Secondary | ICD-10-CM | POA: Diagnosis not present

## 2018-11-02 ENCOUNTER — Other Ambulatory Visit: Payer: Self-pay | Admitting: Family Medicine

## 2018-11-03 ENCOUNTER — Other Ambulatory Visit: Payer: Self-pay | Admitting: Family Medicine

## 2018-11-03 DIAGNOSIS — T148XXA Other injury of unspecified body region, initial encounter: Secondary | ICD-10-CM

## 2018-11-05 NOTE — Telephone Encounter (Signed)
Last fill 08/15/18  #30/1 Last OV 07/15/18

## 2018-11-10 ENCOUNTER — Other Ambulatory Visit: Payer: Self-pay | Admitting: Family Medicine

## 2018-11-10 DIAGNOSIS — E669 Obesity, unspecified: Secondary | ICD-10-CM

## 2018-11-10 DIAGNOSIS — E119 Type 2 diabetes mellitus without complications: Secondary | ICD-10-CM

## 2018-11-26 DIAGNOSIS — G4733 Obstructive sleep apnea (adult) (pediatric): Secondary | ICD-10-CM | POA: Diagnosis not present

## 2018-12-09 ENCOUNTER — Other Ambulatory Visit: Payer: Self-pay

## 2018-12-09 ENCOUNTER — Ambulatory Visit (INDEPENDENT_AMBULATORY_CARE_PROVIDER_SITE_OTHER): Payer: BC Managed Care – PPO | Admitting: Orthopaedic Surgery

## 2018-12-09 ENCOUNTER — Encounter: Payer: Self-pay | Admitting: Orthopaedic Surgery

## 2018-12-09 DIAGNOSIS — M1711 Unilateral primary osteoarthritis, right knee: Secondary | ICD-10-CM | POA: Diagnosis not present

## 2018-12-09 DIAGNOSIS — M1712 Unilateral primary osteoarthritis, left knee: Secondary | ICD-10-CM

## 2018-12-09 MED ORDER — METHYLPREDNISOLONE ACETATE 40 MG/ML IJ SUSP
40.0000 mg | INTRAMUSCULAR | Status: AC | PRN
  Administered 2018-12-09: 13:00:00 40 mg via INTRA_ARTICULAR

## 2018-12-09 MED ORDER — LIDOCAINE HCL 1 % IJ SOLN
2.0000 mL | INTRAMUSCULAR | Status: AC | PRN
  Administered 2018-12-09: 2 mL

## 2018-12-09 MED ORDER — BUPIVACAINE HCL 0.5 % IJ SOLN
2.0000 mL | INTRAMUSCULAR | Status: AC | PRN
  Administered 2018-12-09: 2 mL via INTRA_ARTICULAR

## 2018-12-09 MED ORDER — DICLOFENAC SODIUM 50 MG PO TBEC: 50.0000 mg | DELAYED_RELEASE_TABLET | Freq: Two times a day (BID) | ORAL | 3 refills | Status: DC

## 2018-12-09 MED ORDER — METHYLPREDNISOLONE ACETATE 40 MG/ML IJ SUSP
40.0000 mg | INTRAMUSCULAR | Status: AC | PRN
  Administered 2018-12-09: 40 mg via INTRA_ARTICULAR

## 2018-12-09 NOTE — Progress Notes (Signed)
**Note Robert-Identified via Obfuscation** Office Visit Note   Patient: Robert Gamble           Date of Birth: Jan 17, 1976           MRN: [id number] Visit Date: 12/09/2018              Requested by: Robert Gamble, Robert Gamble,  Robert Gamble [id number] PCP: Robert Deutscher, DO   Assessment & Plan: Visit Diagnoses:  1. Primary osteoarthritis of left knee   2. Primary osteoarthritis of right knee     Plan: Impression is continued pain from bilateral knee osteoarthritis.  Cortisone injections performed today.  Patient was also provided a pamphlet on viscosupplementation.  Recommend Voltaren gel to use on the knees.  Prescription for diclofenac sent in today.  Follow-Up Instructions: Return if symptoms worsen or fail to improve.   Orders:  No orders of the defined types were placed in this encounter.  No orders of the defined types were placed in this encounter.     Procedures: Large Joint Inj: bilateral knee on 12/09/2018 1:16 PM Indications: pain Details: 22 G needle  Arthrogram: No  Medications (Right): 2 mL lidocaine 1 %; 2 mL bupivacaine 0.5 %; 40 mg methylPREDNISolone acetate 40 MG/ML Medications (Left): 2 mL lidocaine 1 %; 2 mL bupivacaine 0.5 %; 40 mg methylPREDNISolone acetate 40 MG/ML Outcome: tolerated well, no immediate complications Patient was prepped and draped in the usual sterile fashion.       Clinical Data: No additional findings.   Subjective: Chief Complaint  Patient presents with  . Left Knee - Pain, Follow-up  . Right Knee - Pain, Follow-up    [name] returns today for bilateral knee pain due to his osteoarthritis.  He is requesting injections today.  Previous injections wore off just a couple weeks ago.  Ibuprofen and meloxicam did not give him much relief.  He would like to try something else.   Review of Systems   Objective: Vital Signs: There were no vitals taken for this visit.  Physical Exam  Ortho Exam Bilateral knee exams are unchanged. Specialty  Comments:  No specialty comments available.  Imaging: No results found.   PMFS History: Patient Active Problem List   Diagnosis Date Noted  . Sleep-related hypoventilation 10/19/2018  . Snoring 07/29/2018  . OSA (obstructive sleep apnea) 07/29/2018  . Other insomnia 07/29/2018  . Class 3 severe obesity due to excess calories with serious comorbidity and body mass index (BMI) of 40.0 to 44.9 in adult (Robert Gamble) 07/29/2018  . Excessive daytime sleepiness 07/29/2018  . HTN, goal below 130/80 07/29/2018  . Hyperlipidemia associated with type 2 diabetes mellitus (Robert Gamble) 10/07/2017  . Morbid obesity (Robert Gamble) 06/17/2017  . Migraines, prn Imitrex and Naproxen 07/27/2016  . Type 2 diabetes mellitus without complication, on Metformin 07/02/2016  . Chronic pain of both knees 03/22/2010  . Hypertension associated with diabetes (Robert Gamble), on Losartan, HCTZ, Verapamil 04/12/2009  . Allergic rhinitis 03/11/2009  . Hypersomnia with sleep apnea 03/04/2009   Past Medical History:  Diagnosis Date  . Allergic rhinitis   . Chronic headache   . DM2 (diabetes mellitus, type 2) (Robert Gamble)   . HTN (hypertension)   . OSA on CPAP     Family History  Problem Relation Age of Onset  . Hypertension Father   . Sleep apnea Sister   . Diabetes Sister   . Hyperlipidemia Sister   . Hypertension Sister   . Mental illness Sister   . Lung cancer Other   .  Clotting disorder Other   . Emphysema Other   . Heart disease Other   . Cancer Maternal Grandmother   . Cancer Maternal Grandfather     Past Surgical History:  Procedure Laterality Date  . APPENDECTOMY     Social History   Occupational History  . Not on file  Tobacco Use  . Smoking status: Former Research scientist (life sciences)  . Smokeless tobacco: Never Used  Substance and Sexual Activity  . Alcohol use: No  . Drug use: No  . Sexual activity: Yes    Birth control/protection: Rhythm

## 2018-12-10 ENCOUNTER — Other Ambulatory Visit: Payer: Self-pay | Admitting: Family Medicine

## 2018-12-10 DIAGNOSIS — E119 Type 2 diabetes mellitus without complications: Secondary | ICD-10-CM

## 2018-12-10 DIAGNOSIS — E669 Obesity, unspecified: Secondary | ICD-10-CM

## 2018-12-19 ENCOUNTER — Other Ambulatory Visit: Payer: Self-pay | Admitting: Family Medicine

## 2018-12-27 DIAGNOSIS — G4733 Obstructive sleep apnea (adult) (pediatric): Secondary | ICD-10-CM | POA: Diagnosis not present

## 2018-12-31 NOTE — Patient Instructions (Signed)
Please continue CPAP therapy nightly and for greater than 4 hours each night  Follow up in 1 year, sooner if needed   Sleep Apnea Sleep apnea affects breathing during sleep. It causes breathing to stop for a short time or to become shallow. It can also increase the risk of:  Heart attack.  Stroke.  Being very overweight (obese).  Diabetes.  Heart failure.  Irregular heartbeat. The goal of treatment is to help you breathe normally again. What are the causes? There are three kinds of sleep apnea:  Obstructive sleep apnea. This is caused by a blocked or collapsed airway.  Central sleep apnea. This happens when the brain does not send the right signals to the muscles that control breathing.  Mixed sleep apnea. This is a combination of obstructive and central sleep apnea. The most common cause of this condition is a collapsed or blocked airway. This can happen if:  Your throat muscles are too relaxed.  Your tongue and tonsils are too large.  You are overweight.  Your airway is too small. What increases the risk?  Being overweight.  Smoking.  Having a small airway.  Being older.  Being male.  Drinking alcohol.  Taking medicines to calm yourself (sedatives or tranquilizers).  Having family members with the condition. What are the signs or symptoms?  Trouble staying asleep.  Being sleepy or tired during the day.  Getting angry a lot.  Loud snoring.  Headaches in the morning.  Not being able to focus your mind (concentrate).  Forgetting things.  Less interest in sex.  Mood swings.  Personality changes.  Feelings of sadness (depression).  Waking up a lot during the night to pee (urinate).  Dry mouth.  Sore throat. How is this diagnosed?  Your medical history.  A physical exam.  A test that is done when you are sleeping (sleep study). The test is most often done in a sleep lab but may also be done at home. How is this treated?    Sleeping on your side.  Using a medicine to get rid of mucus in your nose (decongestant).  Avoiding the use of alcohol, medicines to help you relax, or certain pain medicines (narcotics).  Losing weight, if needed.  Changing your diet.  Not smoking.  Using a machine to open your airway while you sleep, such as: ? An oral appliance. This is a mouthpiece that shifts your lower jaw forward. ? A CPAP device. This device blows air through a mask when you breathe out (exhale). ? An EPAP device. This has valves that you put in each nostril. ? A BPAP device. This device blows air through a mask when you breathe in (inhale) and breathe out.  Having surgery if other treatments do not work. It is important to get treatment for sleep apnea. Without treatment, it can lead to:  High blood pressure.  Coronary artery disease.  In men, not being able to have an erection (impotence).  Reduced thinking ability. Follow these instructions at home: Lifestyle  Make changes that your doctor recommends.  Eat a healthy diet.  Lose weight if needed.  Avoid alcohol, medicines to help you relax, and some pain medicines.  Do not use any products that contain nicotine or tobacco, such as cigarettes, e-cigarettes, and chewing tobacco. If you need help quitting, ask your doctor. General instructions  Take over-the-counter and prescription medicines only as told by your doctor.  If you were given a machine to use while you sleep, use  it only as told by your doctor.  If you are having surgery, make sure to tell your doctor you have sleep apnea. You may need to bring your device with you.  Keep all follow-up visits as told by your doctor. This is important. Contact a doctor if:  The machine that you were given to use during sleep bothers you or does not seem to be working.  You do not get better.  You get worse. Get help right away if:  Your chest hurts.  You have trouble breathing in enough  air.  You have an uncomfortable feeling in your back, arms, or stomach.  You have trouble talking.  One side of your body feels weak.  A part of your face is hanging down. These symptoms may be an emergency. Do not wait to see if the symptoms will go away. Get medical help right away. Call your local emergency services (911 in the [place] Do not drive yourself to the hospital. Summary  This condition affects breathing during sleep.  The most common cause is a collapsed or blocked airway.  The goal of treatment is to help you breathe normally while you sleep. This information is not intended to replace advice given to you by your health care provider. Make sure you discuss any questions you have with your health care provider. Document Released: 11/22/2007 Document Revised: 11/29/2017 Document Reviewed: 10/08/2017 Elsevier Patient Education  2020 [employer].

## 2018-12-31 NOTE — Progress Notes (Signed)
PATIENT: Robert Gamble DOB: 08/01/1975  REASON FOR VISIT: follow up HISTORY FROM: patient  Chief Complaint  Patient presents with   Follow-up    Initial cpap f/u. Alone. Rm 1. No new concerns at this time.      HISTORY OF PRESENT ILLNESS: Today 01/01/19 Robert Gamble is a 43 y.o. male here today for follow up for mild OSA with prolonged hypoxia started on on CPAP recently.  He reports doing very well with CPAP therapy at home.  He is using a nasal pillow and has adjusted nicely.  Compliance report dated 12/01/2018 through 12/30/2018 reveals that he is using CPAP therapy every night for compliance of 100% every night he used CPAP greater than 4 hours for compliance of 100%.  Average usage was 7 hours and 23 minutes.  AHI was 3.3 on 7 to 14 cm of water and an EPR of 2.  There was no significant leak.  HISTORY: (copied from Dr Dohmeier's note on 07/29/2018)  HPI:  Robert Gamble is a 43 y.o. male  Is seen here in a video guided telemedicine consultation after a referral  from Dr. Juleen China for possible sleep apnea.  Robert Gamble is a 43 year old Caucasian gentleman whose current chief complaint is a.  Of 6 months during which he gets on average only 3 to 4 hours of nocturnal sleep.  This all started before the coronavirus lockdown.  He reports that he had a sleep study 7 years ago at [facility] was diagnosed with obstructive sleep apnea and prescribed CPAP.  Within the next 24 months he lost 100 pounds and felt or was told that he does not need CPAP anymore.  Unfortunately he has gained his weight back he reports a current weight of 272 pounds at a height of 5 foot 9 inches.  His wife has started to hear him snore loudly again, and he struggles with daytime sleepiness and fatigue.   I reviewed all the patient's medications as provided on Dr. Lutricia Horsfall referral. The patient has a history of morbid obesity, hypertension associated with diabetes type 2, treated on  3 medications, losartan, hydrochlorothiazide, verapamil. has acute knee pain but also reports that this does not bother him as much at night only when he actually walks. He suffers from migraines and has been taking Naprosyn and Imitrex.  Imitrex of course as needed.   He endorsed the PHQ 9 score for depression in February 2020 at 11 points and now on the new medication on 15 Jul 2018 at only 4 points for this seems to have been an significant improvement.   He has trouble with insomnia not initiating sleep but maintaining sleep, and has been diagnosed with anxiety he takes Cymbalta and Lexapro.  He has apparently been taking trazodone-0 50 mg tablets and tried a house by mouth at bedtime as needed for sleep he is also been on verapamil .  He has been taking baclofen for spasm.  Wellbutrin used as a daytime booster.  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  He drinks 1 coffee in the morning and iced tea at dinner, alcohol is consumed very seldomly.  He does not endorse any form of tobacco use and is a lifelong non-smoker.   Sleep habits: The patient describes that family dinner is between 6 and 7:30 PM, bedtime is usually around 10 PM.  He has no trouble  falling asleep when he goes to bed the bedroom is cool but not quiet and dark for the first hour as his wife insists on watching TV.  The TV however is on a timer and does not accompany the couple throughout the night.  Habitually he sleeps or falls asleep in supine position but he wakes up on his side.  He sleeps with 1-2 pillows, and he usually stays asleep for almost 4 hours before he wakes up sometimes it is nocturia that will wake him up but sometimes it is spontaneous and he is not aware of the trigger.  He denies having any vivid dreams or nightmares, and while he has some problems with knee pain it does not seem to affect him at night, does not create PLM's or  restless legs.  However he has trouble to resume sleep again.  When he rises in the morning around 6 he feels unrefreshed as if he needs more sleep.  He avoids naps- he is basically physically active all day which helps him to overcome sleepiness, but as soon as he returns home after work and sits down to rest he can fall asleep in no time. TST is 3-4 hours only for 6 month .    REVIEW OF SYSTEMS: Out of a complete 14 system review of symptoms, the patient complains only of the following symptoms, headaches and all other reviewed systems are negative.  Epworth sleepiness scale: 10 Fatigue severity scale: 21  ALLERGIES: Allergies  Allergen Reactions   Pregabalin Shortness Of Breath   Phentermine Palpitations   Ramipril Cough   Statins     Myalgia    Zocor [Simvastatin]    Penicillins Rash    HOME MEDICATIONS: Outpatient Medications Prior to Visit  Medication Sig Dispense Refill   buPROPion (WELLBUTRIN XL) 150 MG 24 hr tablet TAKE 1 TABLET BY MOUTH EVERY DAY 90 tablet 1   diclofenac (VOLTAREN) 50 MG EC tablet Take 1 tablet (50 mg total) by mouth 2 (two) times daily. 60 tablet 3   escitalopram (LEXAPRO) 10 MG tablet TAKE 1 TABLET BY MOUTH EVERY DAY 90 tablet 1   loratadine (CLARITIN) 10 MG tablet Take 10 mg by mouth daily.     losartan-hydrochlorothiazide (HYZAAR) 100-25 MG tablet TAKE 1 TABLET BY MOUTH EVERY DAY 30 tablet 1   MELATONIN PO Take by mouth 3 (three) times a week.     metFORMIN (GLUCOPHAGE) 500 MG tablet TAKE 1 TABLET (500 MG TOTAL) BY MOUTH 2 (TWO) TIMES DAILY WITH A MEAL. 60 tablet 0   methocarbamol (ROBAXIN) 500 MG tablet Take 1 tablet (500 mg total) by mouth 2 (two) times daily. 20 tablet 0   Multiple Vitamin (MULTIVITAMIN) capsule Take 1 capsule by mouth daily.     naproxen (NAPROSYN) 500 MG tablet      SUMAtriptan (IMITREX) 100 MG tablet TAKE 1 TAB AS NEEDWED FOR MIGRAINE, REPEAT IN 2 HRS IF PERSISTS OR RECURS 9 tablet 0   traZODone (DESYREL) 50 MG  tablet TAKE 0.5-1 TABLETS (25-50 MG TOTAL) BY MOUTH AT BEDTIME AS NEEDED FOR SLEEP. (Patient taking differently: Take 50 mg by mouth at bedtime as needed for sleep. ) 90 tablet 1   verapamil (CALAN) 120 MG tablet TAKE 1 TABLET (120 MG TOTAL) BY MOUTH 3 (THREE) TIMES DAILY. 90 tablet 0   traZODone (DESYREL) 100 MG tablet Take 1 tablet (100 mg total) by mouth at bedtime. (Patient taking differently: Take 50 mg by mouth at bedtime. ) 90 tablet 1  No facility-administered medications prior to visit.     PAST MEDICAL HISTORY: Past Medical History:  Diagnosis Date   Allergic rhinitis    Chronic headache    DM2 (diabetes mellitus, type 2) (HCC)    HTN (hypertension)    OSA on CPAP     PAST SURGICAL HISTORY: Past Surgical History:  Procedure Laterality Date   APPENDECTOMY      FAMILY HISTORY: Family History  Problem Relation Age of Onset   Hypertension Father    Sleep apnea Sister    Diabetes Sister    Hyperlipidemia Sister    Hypertension Sister    Mental illness Sister    Lung cancer Other    Clotting disorder Other    Emphysema Other    Heart disease Other    Cancer Maternal Grandmother    Cancer Maternal Grandfather     SOCIAL HISTORY: Social History   Socioeconomic History   Marital status: Married    Spouse name: Not on file   Number of children: Not on file   Years of education: Not on file   Highest education level: Not on file  Occupational History   Not on file  Social Needs   Financial resource strain: Not on file   Food insecurity    Worry: Not on file    Inability: Not on file   Transportation needs    Medical: Not on file    Non-medical: Not on file  Tobacco Use   Smoking status: Former Smoker   Smokeless tobacco: Never Used  Substance and Sexual Activity   Alcohol use: No   Drug use: No   Sexual activity: Yes    Birth control/protection: Rhythm  Lifestyle   Physical activity    Days per week: Not on file     Minutes per session: Not on file   Stress: Not on file  Relationships   Social connections    Talks on phone: Not on file    Gets together: Not on file    Attends religious service: Not on file    Active member of club or organization: Not on file    Attends meetings of clubs or organizations: Not on file    Relationship status: Not on file   Intimate partner violence    Fear of current or ex partner: Not on file    Emotionally abused: Not on file    Physically abused: Not on file    Forced sexual activity: Not on file  Other Topics Concern   Not on file  Social History Narrative   Quit ETOH, Street drugs   Married, daughter   Patient is a current smoker, 1/2ppd   Quit ETOH, Street drugs            PHYSICAL EXAM  Vitals:   01/01/19 0734  BP: (!) 153/90  Pulse: 75  Temp: (!) 97.2 F (36.2 C)  TempSrc: Oral  Weight: 271 lb 3.2 oz (123 kg)  Height: 5\' 9"  (1.753 m)   Body mass index is 40.05 kg/m.  Generalized: Well developed, in no acute distress  Cardiology: normal rate and rhythm, no murmur noted Respiratory: Clear to auscultation bilaterally Neurological examination  Mentation: Alert oriented to time, place, history taking. Follows all commands speech and language fluent Cranial nerve II-XII: Pupils were equal round reactive to light. Extraocular movements were full, visual field were full on confrontational test.  Motor: The motor testing reveals 5 over 5 strength of all 4 extremities. Good symmetric  motor tone is noted throughout.  Gait and station: Gait is normal.   DIAGNOSTIC DATA (LABS, IMAGING, TESTING) - I reviewed patient records, labs, notes, testing and imaging myself where available.  No flowsheet data found.   Lab Results  Component Value Date   WBC 6.1 04/08/2018   HGB 15.0 04/08/2018   HCT 42.6 04/08/2018   MCV 85.8 04/08/2018   PLT 270.0 04/08/2018      Component Value Date/Time   NA 134 (L) 06/06/2018 2033   K 3.2 (L) 06/06/2018  2033   CL 101 06/06/2018 2033   CO2 25 06/06/2018 2033   GLUCOSE 105 (H) 06/06/2018 2033   BUN 15 06/06/2018 2033   CREATININE 0.85 06/06/2018 2033   CALCIUM 9.4 06/06/2018 2033   PROT 7.7 04/08/2018 0820   ALBUMIN 4.4 04/08/2018 0820   AST 23 04/08/2018 0820   ALT 29 04/08/2018 0820   ALKPHOS 48 04/08/2018 0820   BILITOT 0.5 04/08/2018 0820   GFRNONAA >60 06/06/2018 2033   GFRAA >60 06/06/2018 2033   Lab Results  Component Value Date   CHOL 175 04/08/2018   HDL 37.20 (L) 04/08/2018   LDLCALC 52 06/17/2017   LDLDIRECT 114.0 04/08/2018   TRIG 271.0 (H) 04/08/2018   CHOLHDL 5 04/08/2018   Lab Results  Component Value Date   HGBA1C 5.6 04/08/2018   Lab Results  Component Value Date   VITAMINB12 606 02/11/2017   Lab Results  Component Value Date   TSH 1.60 07/03/2016       ASSESSMENT AND PLAN 43 y.o. year old male  has a past medical history of Allergic rhinitis, Chronic headache, DM2 (diabetes mellitus, type 2) ([place]), HTN (hypertension), and OSA on CPAP. here with     ICD-10-CM   1. OSA on CPAP  G47.33    Z99.89     Robert Gamble is doing very well with CPAP therapy.  Compliance report reveals excellent compliance.  He has noticed significant reduction in morning headaches.  He is sleeping better.  We will continue CPAP therapy nightly and for greater than 4 hours each night.  He was encouraged to continue working on healthy lifestyle habits.  We will follow-up in 1 year, sooner if needed.  He verbalizes understanding and agreement with this plan.   No orders of the defined types were placed in this encounter.    No orders of the defined types were placed in this encounter.     I spent 15 minutes with the patient. 50% of this time was spent counseling and educating patient on plan of care and medications.    Debbora Presto, FNP-C 01/01/2019, 7:45 AM [facility] [address], [place], [place] [address] [phone]

## 2019-01-01 ENCOUNTER — Ambulatory Visit (INDEPENDENT_AMBULATORY_CARE_PROVIDER_SITE_OTHER): Payer: BC Managed Care – PPO | Admitting: Family Medicine

## 2019-01-01 ENCOUNTER — Other Ambulatory Visit: Payer: Self-pay | Admitting: Family Medicine

## 2019-01-01 ENCOUNTER — Encounter: Payer: Self-pay | Admitting: Family Medicine

## 2019-01-01 ENCOUNTER — Other Ambulatory Visit: Payer: Self-pay

## 2019-01-01 VITALS — BP 153/90 | HR 75 | Temp 97.2°F | Ht 69.0 in | Wt 271.2 lb

## 2019-01-01 DIAGNOSIS — Z9989 Dependence on other enabling machines and devices: Secondary | ICD-10-CM

## 2019-01-01 DIAGNOSIS — G4733 Obstructive sleep apnea (adult) (pediatric): Secondary | ICD-10-CM

## 2019-01-08 ENCOUNTER — Other Ambulatory Visit: Payer: Self-pay | Admitting: Family Medicine

## 2019-01-08 DIAGNOSIS — E119 Type 2 diabetes mellitus without complications: Secondary | ICD-10-CM

## 2019-01-08 DIAGNOSIS — E669 Obesity, unspecified: Secondary | ICD-10-CM

## 2019-01-12 ENCOUNTER — Other Ambulatory Visit: Payer: Self-pay | Admitting: Family Medicine

## 2019-01-12 DIAGNOSIS — F5101 Primary insomnia: Secondary | ICD-10-CM

## 2019-01-12 DIAGNOSIS — F419 Anxiety disorder, unspecified: Secondary | ICD-10-CM

## 2019-01-26 DIAGNOSIS — G4733 Obstructive sleep apnea (adult) (pediatric): Secondary | ICD-10-CM | POA: Diagnosis not present

## 2019-01-29 ENCOUNTER — Other Ambulatory Visit: Payer: Self-pay | Admitting: Family Medicine

## 2019-01-30 NOTE — Telephone Encounter (Signed)
Left a voicemail for the patient to schedule TOC appt

## 2019-01-30 NOTE — Telephone Encounter (Signed)
Last OV 07/22/18 with Inda Coke Last refill 01/01/19 #30/0 Next OV not scheduled  Rx refilled x 30 days. Please contact pt to schedule visit with new PCP.

## 2019-02-03 ENCOUNTER — Other Ambulatory Visit: Payer: Self-pay | Admitting: Family Medicine

## 2019-02-03 DIAGNOSIS — E119 Type 2 diabetes mellitus without complications: Secondary | ICD-10-CM

## 2019-02-03 DIAGNOSIS — E669 Obesity, unspecified: Secondary | ICD-10-CM

## 2019-02-06 ENCOUNTER — Ambulatory Visit (INDEPENDENT_AMBULATORY_CARE_PROVIDER_SITE_OTHER): Payer: BC Managed Care – PPO | Admitting: Physician Assistant

## 2019-02-06 ENCOUNTER — Encounter: Payer: Self-pay | Admitting: Physician Assistant

## 2019-02-06 ENCOUNTER — Other Ambulatory Visit: Payer: Self-pay

## 2019-02-06 VITALS — BP 160/98 | HR 93 | Temp 97.7°F | Ht 69.0 in | Wt 265.4 lb

## 2019-02-06 DIAGNOSIS — E119 Type 2 diabetes mellitus without complications: Secondary | ICD-10-CM | POA: Diagnosis not present

## 2019-02-06 DIAGNOSIS — E1159 Type 2 diabetes mellitus with other circulatory complications: Secondary | ICD-10-CM

## 2019-02-06 DIAGNOSIS — F419 Anxiety disorder, unspecified: Secondary | ICD-10-CM

## 2019-02-06 DIAGNOSIS — E1169 Type 2 diabetes mellitus with other specified complication: Secondary | ICD-10-CM | POA: Diagnosis not present

## 2019-02-06 DIAGNOSIS — E785 Hyperlipidemia, unspecified: Secondary | ICD-10-CM

## 2019-02-06 DIAGNOSIS — I10 Essential (primary) hypertension: Secondary | ICD-10-CM

## 2019-02-06 DIAGNOSIS — I152 Hypertension secondary to endocrine disorders: Secondary | ICD-10-CM

## 2019-02-06 LAB — COMPREHENSIVE METABOLIC PANEL
ALT: 37 U/L (ref 0–53)
AST: 27 U/L (ref 0–37)
Albumin: 4.7 g/dL (ref 3.5–5.2)
Alkaline Phosphatase: 54 U/L (ref 39–117)
BUN: 13 mg/dL (ref 6–23)
CO2: 31 mEq/L (ref 19–32)
Calcium: 9.9 mg/dL (ref 8.4–10.5)
Chloride: 100 mEq/L (ref 96–112)
Creatinine, Ser: 0.79 mg/dL (ref 0.40–1.50)
GFR: 107.3 mL/min (ref >60.00)
Glucose, Bld: 108 mg/dL — ABNORMAL HIGH (ref 70–99)
Potassium: 3.9 mEq/L (ref 3.5–5.1)
Sodium: 140 mEq/L (ref 135–145)
Total Bilirubin: 0.5 mg/dL (ref 0.2–1.2)
Total Protein: 8.1 g/dL (ref 6.0–8.3)

## 2019-02-06 LAB — LDL CHOLESTEROL, DIRECT: Direct LDL: 133 mg/dL

## 2019-02-06 LAB — LIPID PANEL
Cholesterol: 198 mg/dL (ref 0–200)
HDL: 40.9 mg/dL (ref >39.00)
NonHDL: 156.74
Total CHOL/HDL Ratio: 5
Triglycerides: 226 mg/dL — ABNORMAL HIGH (ref 0.0–149.0)
VLDL: 45.2 mg/dL — ABNORMAL HIGH (ref 0.0–40.0)

## 2019-02-06 LAB — CBC WITH DIFFERENTIAL/PLATELET
Basophils Absolute: 0.1 10*3/uL (ref 0.0–0.1)
Basophils Relative: 1.1 % (ref 0.0–3.0)
Eosinophils Absolute: 0.4 10*3/uL (ref 0.0–0.7)
Eosinophils Relative: 6.1 % — ABNORMAL HIGH (ref 0.0–5.0)
HCT: 42.5 % (ref 39.0–52.0)
Hemoglobin: 14.6 g/dL (ref 13.0–17.0)
Lymphocytes Relative: 25.1 % (ref 12.0–46.0)
Lymphs Abs: 1.6 10*3/uL (ref 0.7–4.0)
MCHC: 34.4 g/dL (ref 30.0–36.0)
MCV: 88.6 fl (ref 78.0–100.0)
Monocytes Absolute: 0.6 10*3/uL (ref 0.1–1.0)
Monocytes Relative: 10 % (ref 3.0–12.0)
Neutro Abs: 3.6 10*3/uL (ref 1.4–7.7)
Neutrophils Relative %: 57.7 % (ref 43.0–77.0)
Platelets: 295 10*3/uL (ref 150.0–400.0)
RBC: 4.79 Mil/uL (ref 4.22–5.81)
RDW: 13.7 % (ref 11.5–15.5)
WBC: 6.3 10*3/uL (ref 4.0–10.5)

## 2019-02-06 LAB — HEMOGLOBIN A1C: Hgb A1c MFr Bld: 5.9 % (ref 4.6–6.5)

## 2019-02-06 MED ORDER — FLUOXETINE HCL 20 MG PO TABS: 20.0000 mg | ORAL_TABLET | Freq: Every day | ORAL | 0 refills | Status: DC

## 2019-02-06 MED ORDER — METOPROLOL SUCCINATE ER 25 MG PO TB24: 25.0000 mg | ORAL_TABLET | Freq: Every day | ORAL | 1 refills | Status: DC

## 2019-02-06 NOTE — Progress Notes (Signed)
Robert Gamble is a 43 y.o. male is here to follow up on Hypertension.  I acted as a Education administrator for [employer], PA-C Anselmo Pickler, LPN  History of Present Illness:   Chief Complaint  Patient presents with  . Hypertension  . Anxiety  . Depression    HPI   Hypertension Pt following up today on blood pressure. He is checking his blood pressure at home and is averaging 155/93.  He is currently taking Verapamil 120 mg TID and Losartan-HCTZ 100-25 mg daily.  Pt denies dizziness, blurred vision, chest pain, SOB or lower leg edema. Pt has hx of Migraines. Denies excessive caffeine intake, stimulant usage, excessive alcohol intake or increase in salt consumption.  Compliant with CPAP.  Anxiety & Depression Pt would like to discuss anxiety issues. Pt stopped Lexapro 3 months due not like the way it made him feel, had no feeling. Continues on Wellbutrin 150 mg daily. Denies SI/HI. Started taking Lexapro for anxiety/anger issues and felt like it made him too numb. Tried zoloft in the past without success. Also tried Prozac and cannot remember how it made him feel.  Diabetes/Obesity Continues on Metformin 500 mg BID and is tolerating without issues. Was on JanuMet several years ago. Tries to eat the right things, low-carb, and works out regularly. Denies concerns for hypoglycemia/hyperglycemia.  Wt Readings from Last 5 Encounters:  02/06/19 265 lb 6.1 oz (120.4 kg)  01/01/19 271 lb 3.2 oz (123 kg)  08/15/18 275 lb (124.7 kg)  08/13/18 275 lb (124.7 kg)  07/22/18 262 lb (118.8 kg)   Lab Results  Component Value Date   HGBA1C 5.6 04/08/2018   HLD Statin intolerant. Due for repeat lipid panel today.  Health Maintenance Due  Topic Date Due  . PNEUMOCOCCAL POLYSACCHARIDE VACCINE AGE 14-64 HIGH RISK  08/08/1978  . OPHTHALMOLOGY EXAM  09/26/2017  . FOOT EXAM  10/30/2017  . HEMOGLOBIN A1C  10/07/2018    Past Medical History:  Diagnosis Date  . Allergic rhinitis   . Chronic  headache   . DM2 (diabetes mellitus, type 2) ([place])   . HTN (hypertension)   . OSA on CPAP      Social History   Socioeconomic History  . Marital status: Married    Spouse name: Not on file  . Number of children: Not on file  . Years of education: Not on file  . Highest education level: Not on file  Occupational History  . Not on file  Tobacco Use  . Smoking status: Former Research scientist (life sciences)  . Smokeless tobacco: Never Used  Substance and Sexual Activity  . Alcohol use: No  . Drug use: No  . Sexual activity: Yes    Birth control/protection: Rhythm  Other Topics Concern  . Not on file  Social History Narrative   Quit ETOH, Street drugs   Married, daughter   Patient is a current smoker, 1/2ppd   Quit ETOH, Street drugs         Social Determinants of Radio broadcast assistant Strain:   . Difficulty of Paying Living Expenses: Not on file  Food Insecurity:   . Worried About Charity fundraiser in the Last Year: Not on file  . Ran Out of Food in the Last Year: Not on file  Transportation Needs:   . Lack of Transportation (Medical): Not on file  . Lack of Transportation (Non-Medical): Not on file  Physical Activity:   . Days of Exercise per Week: Not on file  .  Minutes of Exercise per Session: Not on file  Stress:   . Feeling of Stress : Not on file  Social Connections:   . Frequency of Communication with Friends and Family: Not on file  . Frequency of Social Gatherings with Friends and Family: Not on file  . Attends Religious Services: Not on file  . Active Member of Clubs or Organizations: Not on file  . Attends Archivist Meetings: Not on file  . Marital Status: Not on file  Intimate Partner Violence:   . Fear of Current or Ex-Partner: Not on file  . Emotionally Abused: Not on file  . Physically Abused: Not on file  . Sexually Abused: Not on file    Past Surgical History:  Procedure Laterality Date  . APPENDECTOMY      Family History  Problem Relation  Age of Onset  . Hypertension Father   . Sleep apnea Sister   . Diabetes Sister   . Hyperlipidemia Sister   . Hypertension Sister   . Mental illness Sister   . Lung cancer Other   . Clotting disorder Other   . Emphysema Other   . Heart disease Other   . Cancer Maternal Grandmother   . Cancer Maternal Grandfather     PMHx, SurgHx, SocialHx, FamHx, Medications, and Allergies were reviewed in the Visit Navigator and updated as appropriate.   Patient Active Problem List   Diagnosis Date Noted  . Sleep-related hypoventilation 10/19/2018  . Snoring 07/29/2018  . OSA (obstructive sleep apnea) 07/29/2018  . Other insomnia 07/29/2018  . Class 3 severe obesity due to excess calories with serious comorbidity and body mass index (BMI) of 40.0 to 44.9 in adult ([place]) 07/29/2018  . Excessive daytime sleepiness 07/29/2018  . HTN, goal below 130/80 07/29/2018  . Hyperlipidemia associated with type 2 diabetes mellitus ([place]) 10/07/2017  . Morbid obesity ([place]) 06/17/2017  . Migraines, prn Imitrex and Naproxen 07/27/2016  . Type 2 diabetes mellitus without complication, on Metformin 07/02/2016  . Chronic pain of both knees 03/22/2010  . Hypertension associated with diabetes ([place]), on Losartan, HCTZ, Verapamil 04/12/2009  . Allergic rhinitis 03/11/2009  . Hypersomnia with sleep apnea 03/04/2009    Social History   Tobacco Use  . Smoking status: Former Research scientist (life sciences)  . Smokeless tobacco: Never Used  Substance Use Topics  . Alcohol use: No  . Drug use: No    Current Medications and Allergies:    Current Outpatient Medications:  .  buPROPion (WELLBUTRIN XL) 150 MG 24 hr tablet, TAKE 1 TABLET BY MOUTH EVERY DAY, Disp: 90 tablet, Rfl: 1 .  diclofenac (VOLTAREN) 50 MG EC tablet, Take 1 tablet (50 mg total) by mouth 2 (two) times daily., Disp: 60 tablet, Rfl: 3 .  loratadine (CLARITIN) 10 MG tablet, Take 10 mg by mouth daily., Disp: , Rfl:  .  losartan-hydrochlorothiazide (HYZAAR) 100-25 MG tablet,  Take 1 tablet by mouth daily. Call to schedule office visit, Disp: 30 tablet, Rfl: 0 .  MELATONIN PO, Take by mouth 3 (three) times a week., Disp: , Rfl:  .  metFORMIN (GLUCOPHAGE) 500 MG tablet, TAKE 1 TABLET (500 MG TOTAL) BY MOUTH 2 (TWO) TIMES DAILY WITH A MEAL., Disp: 60 tablet, Rfl: 0 .  Multiple Vitamin (MULTIVITAMIN) capsule, Take 1 capsule by mouth daily., Disp: , Rfl:  .  SUMAtriptan (IMITREX) 100 MG tablet, TAKE 1 TAB AS NEEDWED FOR MIGRAINE, REPEAT IN 2 HRS IF PERSISTS OR RECURS, Disp: 9 tablet, Rfl: 0 .  traZODone (  DESYREL) 50 MG tablet, TAKE 0.5-1 TABLETS (25-50 MG TOTAL) BY MOUTH AT BEDTIME AS NEEDED FOR SLEEP., Disp: 30 tablet, Rfl: 0 .  verapamil (CALAN) 120 MG tablet, TAKE 1 TABLET (120 MG TOTAL) BY MOUTH 3 (THREE) TIMES DAILY., Disp: 90 tablet, Rfl: 0 .  escitalopram (LEXAPRO) 10 MG tablet, TAKE 1 TABLET BY MOUTH EVERY DAY (Patient not taking: Reported on 02/06/2019), Disp: 30 tablet, Rfl: 0 .  FLUoxetine (PROZAC) 20 MG tablet, Take 1 tablet (20 mg total) by mouth daily., Disp: 30 tablet, Rfl: 0 .  methocarbamol (ROBAXIN) 500 MG tablet, Take 1 tablet (500 mg total) by mouth 2 (two) times daily. (Patient not taking: Reported on 02/06/2019), Disp: 20 tablet, Rfl: 0 .  metoprolol succinate (TOPROL-XL) 25 MG 24 hr tablet, Take 1 tablet (25 mg total) by mouth daily., Disp: 30 tablet, Rfl: 1 .  naproxen (NAPROSYN) 500 MG tablet, Take 500 mg by mouth as needed. Pt not using now due to taking Diclofenac, Disp: , Rfl:    Allergies  Allergen Reactions  . Pregabalin Shortness Of Breath  . Phentermine Palpitations  . Ramipril Cough  . Statins     Myalgia   . Zocor [Simvastatin]   . Penicillins Rash    Review of Systems   ROS Negative unless otherwise specified per HPI.  Vitals:   Vitals:   02/06/19 1036  BP: (!) 160/98  Pulse: 93  Temp: 97.7 F (36.5 C)  TempSrc: Temporal  SpO2: 96%  Weight: 265 lb 6.1 oz (120.4 kg)  Height: 5\' 9"  (1.753 m)     Body mass index is  39.19 kg/m.   Physical Exam:    Physical Exam Vitals and nursing note reviewed.  Constitutional:      General: He is not in acute distress.    Appearance: He is well-developed. He is not ill-appearing or toxic-appearing.  Cardiovascular:     Rate and Rhythm: Normal rate and regular rhythm.     Pulses: Normal pulses.     Heart sounds: Normal heart sounds, S1 normal and S2 normal.     Comments: No LE edema Pulmonary:     Effort: Pulmonary effort is normal.     Breath sounds: Normal breath sounds.  Skin:    General: Skin is warm and dry.  Neurological:     Mental Status: He is alert.     GCS: GCS eye subscore is 4. GCS verbal subscore is 5. GCS motor subscore is 6.  Psychiatric:        Speech: Speech normal.        Behavior: Behavior normal. Behavior is cooperative.      Assessment and Plan:    [place] was seen today for hypertension, anxiety and depression.  Diagnoses and all orders for this visit:  Hypertension associated with diabetes ([place]) Uncontrolled. Continue Losartan-HCTZ 100-25 mg and Verapamil 120 mg TID. Start Toprol-XL 25 mg daily. Follow-up in 1 month, sooner if concerns. Continue to check bp at home and bring log on return.  Morbid obesity ([place]); Type 2 diabetes mellitus without complication, without long-term current use of insulin ([place]) Due for HgbA1c re-check. He is wanting medication to help lose weight, I feel as though he would be a great candidate for Ozempic. Sample given in office today. Will have him start 0.25 mg weekly x 6 weeks. Follow-up in 1 month for [place] to re-evaluate tolerance of Ozempic. Continue Metformin 500 mg BID. -     CBC with Differential/Platelet -  Comprehensive metabolic panel -     Hemoglobin A1c  Hyperlipidemia associated with type 2 diabetes mellitus (HCC) Statin intolerant. Will update lipid panel and ASCVD. -     Lipid panel  Anxiety Uncontrolled. Will start Prozac 20 mg daily, and continue Wellbutrin 150 mg daily.  Follow-up in 1 month, sooner if concerns. I discussed with patient that if they develop any SI, to tell someone immediately and seek medical attention.  Other orders -     metoprolol succinate (TOPROL-XL) 25 MG 24 hr tablet; Take 1 tablet (25 mg total) by mouth daily. -     FLUoxetine (PROZAC) 20 MG tablet; Take 1 tablet (20 mg total) by mouth daily.  . Reviewed expectations re: course of current medical issues. . Discussed self-management of symptoms. . Outlined signs and symptoms indicating need for more acute intervention. . Patient verbalized understanding and all questions were answered. . See orders for this visit as documented in the electronic medical record. . Patient received an After Visit Summary.  CMA or LPN served as scribe during this visit. History, Physical, and Plan performed by medical provider. The above documentation has been reviewed and is accurate and complete.  Inda Coke, PA-C , [facility] 02/06/2019  Follow-up: No follow-ups on file.

## 2019-02-06 NOTE — Patient Instructions (Addendum)
It was great to see you!  1. Start Toprol XL 25 mg daily for your blood pressure. Continue other two medications.  2. I will be in touch with you via MyChart with your labs about metformin dose and other recommendations. Please wait to start ozempic until I get your labs back.  3. Start prozac 20 mg daily.  Let's follow-up in 1 month (because we are adjusting your blood pressure medicines and adding prozac), sooner if you have concerns.  Take care,  Inda Coke PA-C

## 2019-02-08 ENCOUNTER — Other Ambulatory Visit: Payer: Self-pay | Admitting: Family Medicine

## 2019-02-08 DIAGNOSIS — F329 Major depressive disorder, single episode, unspecified: Secondary | ICD-10-CM

## 2019-02-10 ENCOUNTER — Telehealth: Payer: Self-pay | Admitting: Physician Assistant

## 2019-02-10 DIAGNOSIS — E669 Obesity, unspecified: Secondary | ICD-10-CM

## 2019-02-10 DIAGNOSIS — E119 Type 2 diabetes mellitus without complications: Secondary | ICD-10-CM

## 2019-02-10 DIAGNOSIS — F329 Major depressive disorder, single episode, unspecified: Secondary | ICD-10-CM

## 2019-02-10 MED ORDER — BUPROPION HCL ER (XL) 150 MG PO TB24: 150.0000 mg | ORAL_TABLET | Freq: Every day | ORAL | 1 refills | Status: DC

## 2019-02-10 MED ORDER — METFORMIN HCL 500 MG PO TABS: 500.0000 mg | ORAL_TABLET | Freq: Two times a day (BID) | ORAL | 1 refills | Status: DC

## 2019-02-10 NOTE — Telephone Encounter (Signed)
Pt called asking for medication refill. Pt stated he is not feeling well and would like to speak with the nurse as soon as possible. Please advise.

## 2019-02-10 NOTE — Addendum Note (Signed)
Addended by: Marian Sorrow on: 02/10/2019 03:45 PM   Modules accepted: Orders

## 2019-02-10 NOTE — Telephone Encounter (Addendum)
Spoke to pt asked him how I could help him? Pt said needs refill on Wellbutrin and Metformin. Told pt will send to pharmacy now for you . [facility] Pt verbalized understanding. Rx's sent.

## 2019-02-12 ENCOUNTER — Other Ambulatory Visit: Payer: Self-pay | Admitting: Family Medicine

## 2019-02-12 DIAGNOSIS — F5101 Primary insomnia: Secondary | ICD-10-CM

## 2019-02-12 DIAGNOSIS — F419 Anxiety disorder, unspecified: Secondary | ICD-10-CM

## 2019-02-16 ENCOUNTER — Other Ambulatory Visit: Payer: Self-pay | Admitting: Physician Assistant

## 2019-02-16 MED ORDER — VERAPAMIL HCL 120 MG PO TABS: 120.0000 mg | ORAL_TABLET | Freq: Three times a day (TID) | ORAL | 1 refills | Status: DC

## 2019-02-25 ENCOUNTER — Other Ambulatory Visit: Payer: Self-pay | Admitting: Physician Assistant

## 2019-02-26 DIAGNOSIS — G4733 Obstructive sleep apnea (adult) (pediatric): Secondary | ICD-10-CM | POA: Diagnosis not present

## 2019-03-01 ENCOUNTER — Other Ambulatory Visit: Payer: Self-pay | Admitting: Physician Assistant

## 2019-03-06 ENCOUNTER — Other Ambulatory Visit: Payer: Self-pay | Admitting: *Deleted

## 2019-03-06 MED ORDER — LOSARTAN POTASSIUM-HCTZ 100-25 MG PO TABS: 1.0000 | ORAL_TABLET | Freq: Every day | ORAL | 5 refills | Status: DC

## 2019-03-12 ENCOUNTER — Other Ambulatory Visit: Payer: Self-pay

## 2019-03-13 ENCOUNTER — Encounter: Payer: Self-pay | Admitting: Physician Assistant

## 2019-03-13 ENCOUNTER — Ambulatory Visit (INDEPENDENT_AMBULATORY_CARE_PROVIDER_SITE_OTHER): Payer: BC Managed Care – PPO | Admitting: Physician Assistant

## 2019-03-13 VITALS — BP 130/80 | HR 60 | Temp 97.2°F | Ht 69.0 in | Wt 261.4 lb

## 2019-03-13 DIAGNOSIS — I152 Hypertension secondary to endocrine disorders: Secondary | ICD-10-CM

## 2019-03-13 DIAGNOSIS — F419 Anxiety disorder, unspecified: Secondary | ICD-10-CM | POA: Diagnosis not present

## 2019-03-13 DIAGNOSIS — E1159 Type 2 diabetes mellitus with other circulatory complications: Secondary | ICD-10-CM | POA: Diagnosis not present

## 2019-03-13 DIAGNOSIS — E669 Obesity, unspecified: Secondary | ICD-10-CM

## 2019-03-13 DIAGNOSIS — M62830 Muscle spasm of back: Secondary | ICD-10-CM

## 2019-03-13 DIAGNOSIS — I10 Essential (primary) hypertension: Secondary | ICD-10-CM

## 2019-03-13 MED ORDER — METHOCARBAMOL 500 MG PO TABS: 500.0000 mg | ORAL_TABLET | Freq: Two times a day (BID) | ORAL | 0 refills | Status: DC

## 2019-03-13 NOTE — Progress Notes (Signed)
Robert Gamble is a 43 y.o. male is here to follow up on hypertension, anxiety and weight loss.  I acted as a Education administrator for [employer], PA-C Anselmo Pickler, LPN  History of Present Illness:   Chief Complaint  Patient presents with  . Hypertension  . Anxiety    HPI   Hypertension Pt following up today, checking blood pressure at home averaging [id number] systolic, upper [id number] diastolic. Currently taking Losartan-HCTZ 100-25 mg daily, Verapamil 120 mg TID, Metoprolol 25 mg daily was added last visit. Pt denies headaches, dizziness, blurred vision, chest pain, SOB or lower leg edema. Denies excessive caffeine intake, stimulant usage, excessive alcohol intake or increase in salt consumption.  Anxiety Pt following up today, was started on Prozac 20 mg at last visit 12/11. Pt said it has helped with his anxiety. Continues on Wellbutrin 150 mg daily. Denies SI/HI.  Obesity Pt stopped Ozempic 3 weeks ago, only took for 2 weeks. Had severe vomiting. Pt is exercising and watching diet. Down about 4 lb since we last saw him.  Wt Readings from Last 4 Encounters:  03/13/19 261 lb 6.1 oz (118.6 kg)  02/06/19 265 lb 6.1 oz (120.4 kg)  01/01/19 271 lb 3.2 oz (123 kg)  08/15/18 275 lb (124.7 kg)    Back spasms For the past two weeks has had ongoing back spasms. Mid-back on R side. After working all day, has sharp, stabbing pain. Last summer had similar issues on the other side. Has taken robaxin in the past with relief. Would like a refill if possible. Denies: rectal bleeding, saddle anesthesia, incontinence.   Health Maintenance Due  Topic Date Due  . PNEUMOCOCCAL POLYSACCHARIDE VACCINE AGE 85-64 HIGH RISK  08/08/1978  . OPHTHALMOLOGY EXAM  09/26/2017  . FOOT EXAM  10/30/2017    Past Medical History:  Diagnosis Date  . Allergic rhinitis   . Chronic headache   . DM2 (diabetes mellitus, type 2) ([place])   . HTN (hypertension)   . OSA on CPAP      Social History   Socioeconomic  History  . Marital status: Married    Spouse name: Not on file  . Number of children: Not on file  . Years of education: Not on file  . Highest education level: Not on file  Occupational History  . Not on file  Tobacco Use  . Smoking status: Former Research scientist (life sciences)  . Smokeless tobacco: Never Used  Substance and Sexual Activity  . Alcohol use: No  . Drug use: No  . Sexual activity: Yes    Birth control/protection: Rhythm  Other Topics Concern  . Not on file  Social History Narrative   Quit ETOH, Street drugs   Married, daughter   Patient is a current smoker, 1/2ppd   Quit ETOH, Street drugs         Social Determinants of Radio broadcast assistant Strain:   . Difficulty of Paying Living Expenses: Not on file  Food Insecurity:   . Worried About Charity fundraiser in the Last Year: Not on file  . Ran Out of Food in the Last Year: Not on file  Transportation Needs:   . Lack of Transportation (Medical): Not on file  . Lack of Transportation (Non-Medical): Not on file  Physical Activity:   . Days of Exercise per Week: Not on file  . Minutes of Exercise per Session: Not on file  Stress:   . Feeling of Stress : Not on file  Social  Connections:   . Frequency of Communication with Friends and Family: Not on file  . Frequency of Social Gatherings with Friends and Family: Not on file  . Attends Religious Services: Not on file  . Active Member of Clubs or Organizations: Not on file  . Attends Archivist Meetings: Not on file  . Marital Status: Not on file  Intimate Partner Violence:   . Fear of Current or Ex-Partner: Not on file  . Emotionally Abused: Not on file  . Physically Abused: Not on file  . Sexually Abused: Not on file    Past Surgical History:  Procedure Laterality Date  . APPENDECTOMY      Family History  Problem Relation Age of Onset  . Hypertension Father   . Sleep apnea Sister   . Diabetes Sister   . Hyperlipidemia Sister   . Hypertension Sister     . Mental illness Sister   . Lung cancer Other   . Clotting disorder Other   . Emphysema Other   . Heart disease Other   . Cancer Maternal Grandmother   . Cancer Maternal Grandfather     PMHx, SurgHx, SocialHx, FamHx, Medications, and Allergies were reviewed in the Visit Navigator and updated as appropriate.   Patient Active Problem List   Diagnosis Date Noted  . Sleep-related hypoventilation 10/19/2018  . Snoring 07/29/2018  . OSA (obstructive sleep apnea) 07/29/2018  . Other insomnia 07/29/2018  . Class 3 severe obesity due to excess calories with serious comorbidity and body mass index (BMI) of 40.0 to 44.9 in adult ([place]) 07/29/2018  . Excessive daytime sleepiness 07/29/2018  . HTN, goal below 130/80 07/29/2018  . Hyperlipidemia associated with type 2 diabetes mellitus ([place]) 10/07/2017  . Morbid obesity ([place]) 06/17/2017  . Migraines, prn Imitrex and Naproxen 07/27/2016  . Type 2 diabetes mellitus without complication, on Metformin 07/02/2016  . Chronic pain of both knees 03/22/2010  . Hypertension associated with diabetes ([place]), on Losartan, HCTZ, Verapamil 04/12/2009  . Allergic rhinitis 03/11/2009  . Hypersomnia with sleep apnea 03/04/2009    Social History   Tobacco Use  . Smoking status: Former Research scientist (life sciences)  . Smokeless tobacco: Never Used  Substance Use Topics  . Alcohol use: No  . Drug use: No    Current Medications and Allergies:    Current Outpatient Medications:  .  buPROPion (WELLBUTRIN XL) 150 MG 24 hr tablet, Take 1 tablet (150 mg total) by mouth daily., Disp: 90 tablet, Rfl: 1 .  diclofenac (VOLTAREN) 50 MG EC tablet, Take 1 tablet (50 mg total) by mouth 2 (two) times daily., Disp: 60 tablet, Rfl: 3 .  FLUoxetine (PROZAC) 20 MG tablet, TAKE 1 TABLET BY MOUTH EVERY DAY, Disp: 90 tablet, Rfl: 0 .  loratadine (CLARITIN) 10 MG tablet, Take 10 mg by mouth daily., Disp: , Rfl:  .  losartan-hydrochlorothiazide (HYZAAR) 100-25 MG tablet, Take 1 tablet by mouth  daily., Disp: 30 tablet, Rfl: 5 .  MELATONIN PO, Take by mouth 3 (three) times a week., Disp: , Rfl:  .  metFORMIN (GLUCOPHAGE) 500 MG tablet, Take 1 tablet (500 mg total) by mouth 2 (two) times daily with a meal., Disp: 180 tablet, Rfl: 1 .  methocarbamol (ROBAXIN) 500 MG tablet, Take 1 tablet (500 mg total) by mouth 2 (two) times daily., Disp: 20 tablet, Rfl: 0 .  metoprolol succinate (TOPROL-XL) 25 MG 24 hr tablet, TAKE 1 TABLET BY MOUTH EVERY DAY, Disp: 30 tablet, Rfl: 1 .  Multiple Vitamin (MULTIVITAMIN)  capsule, Take 1 capsule by mouth daily., Disp: , Rfl:  .  naproxen (NAPROSYN) 500 MG tablet, Take 500 mg by mouth as needed. Pt not using now due to taking Diclofenac, Disp: , Rfl:  .  SUMAtriptan (IMITREX) 100 MG tablet, TAKE 1 TAB AS NEEDWED FOR MIGRAINE, REPEAT IN 2 HRS IF PERSISTS OR RECURS, Disp: 9 tablet, Rfl: 0 .  traZODone (DESYREL) 50 MG tablet, TAKE 0.5-1 TABLETS (25-50 MG TOTAL) BY MOUTH AT BEDTIME AS NEEDED FOR SLEEP., Disp: 90 tablet, Rfl: 0 .  verapamil (CALAN) 120 MG tablet, Take 1 tablet (120 mg total) by mouth 3 (three) times daily., Disp: 90 tablet, Rfl: 1   Allergies  Allergen Reactions  . Pregabalin Shortness Of Breath  . Phentermine Palpitations  . Ramipril Cough  . Statins     Myalgia   . Zocor [Simvastatin]   . Penicillins Rash    Review of Systems   ROS  Negative unless otherwise specified per HPI.  Vitals:   Vitals:   03/13/19 0810  BP: 130/80  Pulse: 60  Temp: (!) 97.2 F (36.2 C)  TempSrc: Temporal  SpO2: 95%  Weight: 261 lb 6.1 oz (118.6 kg)  Height: 5\' 9"  (1.753 m)     Body mass index is 38.6 kg/m.   Physical Exam:    Physical Exam Vitals and nursing note reviewed.  Constitutional:      General: He is not in acute distress.    Appearance: He is well-developed. He is not ill-appearing or toxic-appearing.  Cardiovascular:     Rate and Rhythm: Normal rate and regular rhythm.     Pulses: Normal pulses.     Heart sounds: Normal heart  sounds, S1 normal and S2 normal.     Comments: No LE edema Pulmonary:     Effort: Pulmonary effort is normal.     Breath sounds: Normal breath sounds.  Musculoskeletal:     Comments: Pain to palpation of mid R back paraspinal muscle; small spasm palpated  Skin:    General: Skin is warm and dry.  Neurological:     Mental Status: He is alert.     GCS: GCS eye subscore is 4. GCS verbal subscore is 5. GCS motor subscore is 6.  Psychiatric:        Speech: Speech normal.        Behavior: Behavior normal. Behavior is cooperative.      Assessment and Plan:    Robert Gamble was seen today for hypertension and anxiety.  Diagnoses and all orders for this visit:  Hypertension associated with diabetes ([place]) Currently well controlled. Continue current regimen, follow-up in 6 months, sooner if concerns.  Anxiety Much improved on current regimen. Follow-up in 6 months, sooner if concerns.  Obesity (BMI 30-39.9) Continue lifestyle modifications. Declines further intervention at this time.  Back spasm No red flags on discussion. Will restart robaxin. If worsening, may need to do a round of prednisone.  Other orders -     methocarbamol (ROBAXIN) 500 MG tablet; Take 1 tablet (500 mg total) by mouth 2 (two) times daily.    . Reviewed expectations re: course of current medical issues. . Discussed self-management of symptoms. . Outlined signs and symptoms indicating need for more acute intervention. . Patient verbalized understanding and all questions were answered. . See orders for this visit as documented in the electronic medical record. . Patient received an After Visit Summary.  CMA or LPN served as scribe during this visit. History, Physical, and Plan  performed by medical provider. The above documentation has been reviewed and is accurate and complete.   Inda Coke, PA-C [place], [place] 03/13/2019  Follow-up: No follow-ups on file.

## 2019-03-16 ENCOUNTER — Encounter: Payer: Self-pay | Admitting: Physician Assistant

## 2019-03-16 MED ORDER — FLUOXETINE HCL 20 MG PO TABS: 20.0000 mg | ORAL_TABLET | Freq: Every day | ORAL | 0 refills | Status: DC

## 2019-03-16 MED ORDER — METHOCARBAMOL 500 MG PO TABS: 500.0000 mg | ORAL_TABLET | Freq: Two times a day (BID) | ORAL | 0 refills | Status: DC

## 2019-03-16 NOTE — Telephone Encounter (Signed)
Spoke to pt told him Rx's were sent Prozac to [facility] on [address] and Robaxin. Pt said Prozac needs to go to [facility] on [address] and Robaxin to [facility] on [address]. Told pt okay I will resend Rx's now. Pt verbalized understanding.

## 2019-03-17 ENCOUNTER — Encounter: Payer: Self-pay | Admitting: Physician Assistant

## 2019-03-17 MED ORDER — FLUOXETINE HCL 20 MG PO CAPS: 20.0000 mg | ORAL_CAPSULE | Freq: Every day | ORAL | 1 refills | Status: DC

## 2019-03-22 ENCOUNTER — Other Ambulatory Visit: Payer: Self-pay | Admitting: Physician Assistant

## 2019-03-29 DIAGNOSIS — G4733 Obstructive sleep apnea (adult) (pediatric): Secondary | ICD-10-CM | POA: Diagnosis not present

## 2019-03-30 ENCOUNTER — Other Ambulatory Visit: Payer: Self-pay | Admitting: *Deleted

## 2019-03-30 MED ORDER — METOPROLOL SUCCINATE ER 25 MG PO TB24: 25.0000 mg | ORAL_TABLET | Freq: Every day | ORAL | 5 refills | Status: DC

## 2019-03-31 ENCOUNTER — Other Ambulatory Visit: Payer: Self-pay | Admitting: Orthopaedic Surgery

## 2019-04-21 ENCOUNTER — Encounter: Payer: Self-pay | Admitting: Physician Assistant

## 2019-04-21 MED ORDER — VERAPAMIL HCL 120 MG PO TABS: 120.0000 mg | ORAL_TABLET | Freq: Three times a day (TID) | ORAL | 2 refills | Status: DC

## 2019-04-26 DIAGNOSIS — G4733 Obstructive sleep apnea (adult) (pediatric): Secondary | ICD-10-CM | POA: Diagnosis not present

## 2019-05-06 ENCOUNTER — Other Ambulatory Visit: Payer: Self-pay | Admitting: Physician Assistant

## 2019-05-06 DIAGNOSIS — F5101 Primary insomnia: Secondary | ICD-10-CM

## 2019-05-06 DIAGNOSIS — F419 Anxiety disorder, unspecified: Secondary | ICD-10-CM

## 2019-06-05 DIAGNOSIS — G4733 Obstructive sleep apnea (adult) (pediatric): Secondary | ICD-10-CM | POA: Diagnosis not present

## 2019-06-05 LAB — HM DIABETES EYE EXAM

## 2019-06-08 ENCOUNTER — Encounter: Payer: Self-pay | Admitting: Physician Assistant

## 2019-06-26 DIAGNOSIS — G4733 Obstructive sleep apnea (adult) (pediatric): Secondary | ICD-10-CM | POA: Diagnosis not present

## 2019-07-23 ENCOUNTER — Other Ambulatory Visit: Payer: Self-pay | Admitting: Physician Assistant

## 2019-07-26 ENCOUNTER — Other Ambulatory Visit: Payer: Self-pay | Admitting: Physician Assistant

## 2019-07-27 DIAGNOSIS — G4733 Obstructive sleep apnea (adult) (pediatric): Secondary | ICD-10-CM | POA: Diagnosis not present

## 2019-08-01 ENCOUNTER — Other Ambulatory Visit: Payer: Self-pay | Admitting: Physician Assistant

## 2019-08-01 DIAGNOSIS — F419 Anxiety disorder, unspecified: Secondary | ICD-10-CM

## 2019-08-01 DIAGNOSIS — F5101 Primary insomnia: Secondary | ICD-10-CM

## 2019-08-01 DIAGNOSIS — F329 Major depressive disorder, single episode, unspecified: Secondary | ICD-10-CM

## 2019-08-04 ENCOUNTER — Other Ambulatory Visit: Payer: Self-pay | Admitting: Family Medicine

## 2019-08-04 DIAGNOSIS — F419 Anxiety disorder, unspecified: Secondary | ICD-10-CM

## 2019-08-18 DIAGNOSIS — S61411A Laceration without foreign body of right hand, initial encounter: Secondary | ICD-10-CM | POA: Diagnosis not present

## 2019-08-20 ENCOUNTER — Other Ambulatory Visit: Payer: Self-pay | Admitting: Physician Assistant

## 2019-08-21 ENCOUNTER — Other Ambulatory Visit: Payer: Self-pay | Admitting: Physician Assistant

## 2019-08-26 ENCOUNTER — Other Ambulatory Visit: Payer: Self-pay | Admitting: Family Medicine

## 2019-08-26 DIAGNOSIS — F5101 Primary insomnia: Secondary | ICD-10-CM

## 2019-08-26 DIAGNOSIS — F329 Major depressive disorder, single episode, unspecified: Secondary | ICD-10-CM

## 2019-08-27 ENCOUNTER — Other Ambulatory Visit: Payer: Self-pay | Admitting: Physician Assistant

## 2019-08-27 DIAGNOSIS — E669 Obesity, unspecified: Secondary | ICD-10-CM

## 2019-08-27 DIAGNOSIS — E119 Type 2 diabetes mellitus without complications: Secondary | ICD-10-CM

## 2019-09-03 DIAGNOSIS — G4733 Obstructive sleep apnea (adult) (pediatric): Secondary | ICD-10-CM | POA: Diagnosis not present

## 2019-09-11 ENCOUNTER — Encounter: Payer: Self-pay | Admitting: Physician Assistant

## 2019-09-11 ENCOUNTER — Ambulatory Visit (INDEPENDENT_AMBULATORY_CARE_PROVIDER_SITE_OTHER): Payer: BC Managed Care – PPO | Admitting: Physician Assistant

## 2019-09-11 ENCOUNTER — Other Ambulatory Visit: Payer: Self-pay

## 2019-09-11 VITALS — BP 138/82 | HR 59 | Temp 97.9°F | Ht 69.0 in | Wt 256.0 lb

## 2019-09-11 DIAGNOSIS — E119 Type 2 diabetes mellitus without complications: Secondary | ICD-10-CM

## 2019-09-11 DIAGNOSIS — F329 Major depressive disorder, single episode, unspecified: Secondary | ICD-10-CM | POA: Diagnosis not present

## 2019-09-11 DIAGNOSIS — I10 Essential (primary) hypertension: Secondary | ICD-10-CM

## 2019-09-11 DIAGNOSIS — I152 Hypertension secondary to endocrine disorders: Secondary | ICD-10-CM

## 2019-09-11 DIAGNOSIS — E1159 Type 2 diabetes mellitus with other circulatory complications: Secondary | ICD-10-CM

## 2019-09-11 DIAGNOSIS — F419 Anxiety disorder, unspecified: Secondary | ICD-10-CM | POA: Diagnosis not present

## 2019-09-11 DIAGNOSIS — M62830 Muscle spasm of back: Secondary | ICD-10-CM | POA: Diagnosis not present

## 2019-09-11 LAB — POCT GLYCOSYLATED HEMOGLOBIN (HGB A1C): Hemoglobin A1C: 5.5 % (ref 4.0–5.6)

## 2019-09-11 MED ORDER — FLUOXETINE HCL 20 MG PO CAPS: 20.0000 mg | ORAL_CAPSULE | Freq: Every day | ORAL | 1 refills | Status: DC

## 2019-09-11 MED ORDER — DICLOFENAC SODIUM 50 MG PO TBEC: 50.0000 mg | DELAYED_RELEASE_TABLET | Freq: Two times a day (BID) | ORAL | 3 refills | Status: DC

## 2019-09-11 MED ORDER — METHOCARBAMOL 500 MG PO TABS: 500.0000 mg | ORAL_TABLET | Freq: Two times a day (BID) | ORAL | 1 refills | Status: DC

## 2019-09-11 NOTE — Patient Instructions (Signed)
It was great to see you!  1. Continue BP meds 2. Stop Wellbutrin. If you'd like, you could consider taking every other day if you feel like you are having withdrawal symptoms. We can always increase Prozac if needed -- you have my permission to increase to 40 mg daily and if this is beneficial for you, let me know and will send in updated dose. 3. Blood sugars are doing great! Let's continue metformin for now.  Let's follow-up in 3-6 months, sooner if you have concerns.  Take care,  Inda Coke PA-C

## 2019-09-11 NOTE — Progress Notes (Signed)
Robert Gamble is a 44 y.o. male is here to follow up.  I acted as a Education administrator for [employer], PA-C [facility], [place]  History of Present Illness:   Chief Complaint  Patient presents with  . Hypertension  . Anxiety  . Back spasm  . Diabetes    HPI  Hypertension Pt following up on blood pressure. Currently on Hyzaar 100-25 mg daily, Verapamil 120 mg TID, Toprol XL 25 mg daily. Checks BP readings at home occasionally. Readings are >140/90. Pt denies headaches, dizziness, blurred vision, chest pain, SOB or lower leg edema. Denies excessive caffeine intake, stimulant usage, excessive alcohol intake or increase in salt consumption. Patient took two Imitrex due to a migraine yesterday.  BP Readings from Last 3 Encounters:  09/11/19 138/82  03/13/19 130/80  02/06/19 (!) 160/98   Anxiety Taking Fluoxetine 20 mg daily. This was changed prior from Lexapro. Taking Wellbutrin but wants to talk about weaning off of it because it does not help much, or he thinks maybe the combination is working well for him. Denies SI/HI.  Back Spasm Pt is having upper back spasms constantly in the upper mid back, mostly on the R side. Denies chest pain, SOB, n/v. States the Methocarbamol and Diclofenac helps keep symptoms stable. Would like to refill Methocarbamol and Diclofenac. He has a very physically demanding job that causes him lots of back pain at times.  Diabetes Readings have been around 90-95. Stable while on Metformin. Diet and exercise is stable. Has cut out most of sugary beverages. He is down 5 lb since seeing [place] 6 months ago.  Wt Readings from Last 4 Encounters:  09/11/19 256 lb (116.1 kg)  03/13/19 261 lb 6.1 oz (118.6 kg)  02/06/19 265 lb 6.1 oz (120.4 kg)  01/01/19 271 lb 3.2 oz (123 kg)     Health Maintenance Due  Topic Date Due  . Hepatitis C Screening  Never done  . PNEUMOCOCCAL POLYSACCHARIDE VACCINE AGE 102-64 HIGH RISK  Never done  . FOOT EXAM  10/30/2017    Past  Medical History:  Diagnosis Date  . Allergic rhinitis   . Chronic headache   . DM2 (diabetes mellitus, type 2) ([place])   . HTN (hypertension)   . OSA on CPAP      Social History   Tobacco Use  . Smoking status: Former Research scientist (life sciences)  . Smokeless tobacco: Never Used  Vaping Use  . Vaping Use: Never used  Substance Use Topics  . Alcohol use: No  . Drug use: No    Past Surgical History:  Procedure Laterality Date  . APPENDECTOMY      Family History  Problem Relation Age of Onset  . Hypertension Father   . Sleep apnea Sister   . Diabetes Sister   . Hyperlipidemia Sister   . Hypertension Sister   . Mental illness Sister   . Lung cancer Other   . Clotting disorder Other   . Emphysema Other   . Heart disease Other   . Cancer Maternal Grandmother   . Cancer Maternal Grandfather     PMHx, SurgHx, SocialHx, FamHx, Medications, and Allergies were reviewed in the Visit Navigator and updated as appropriate.   Patient Active Problem List   Diagnosis Date Noted  . Sleep-related hypoventilation 10/19/2018  . Snoring 07/29/2018  . OSA (obstructive sleep apnea) 07/29/2018  . Other insomnia 07/29/2018  . Class 3 severe obesity due to excess calories with serious comorbidity and body mass index (BMI) of  40.0 to 44.9 in adult [facility]) 07/29/2018  . Excessive daytime sleepiness 07/29/2018  . HTN, goal below 130/80 07/29/2018  . Hyperlipidemia associated with type 2 diabetes mellitus ([place]) 10/07/2017  . Morbid obesity ([place]) 06/17/2017  . Migraines, prn Imitrex and Naproxen 07/27/2016  . Type 2 diabetes mellitus without complication, on Metformin 07/02/2016  . Chronic pain of both knees 03/22/2010  . Hypertension associated with diabetes ([place]), on Losartan, HCTZ, Verapamil 04/12/2009  . Allergic rhinitis 03/11/2009  . Hypersomnia with sleep apnea 03/04/2009    Social History   Tobacco Use  . Smoking status: Former Research scientist (life sciences)  . Smokeless tobacco: Never Used  Vaping Use  . Vaping Use:  Never used  Substance Use Topics  . Alcohol use: No  . Drug use: No    Current Medications and Allergies:    Current Outpatient Medications:  .  buPROPion (WELLBUTRIN XL) 150 MG 24 hr tablet, TAKE 1 TABLET BY MOUTH EVERY DAY, Disp: 90 tablet, Rfl: 2 .  loratadine (CLARITIN) 10 MG tablet, Take 10 mg by mouth daily., Disp: , Rfl:  .  losartan-hydrochlorothiazide (HYZAAR) 100-25 MG tablet, TAKE 1 TABLET BY MOUTH EVERY DAY, Disp: 30 tablet, Rfl: 5 .  metFORMIN (GLUCOPHAGE) 500 MG tablet, TAKE 1 TABLET (500 MG TOTAL) BY MOUTH 2 (TWO) TIMES DAILY WITH A MEAL., Disp: 60 tablet, Rfl: 5 .  methocarbamol (ROBAXIN) 500 MG tablet, Take 1 tablet (500 mg total) by mouth 2 (two) times daily., Disp: 60 tablet, Rfl: 1 .  metoprolol succinate (TOPROL-XL) 25 MG 24 hr tablet, Take 1 tablet (25 mg total) by mouth daily., Disp: 30 tablet, Rfl: 5 .  Multiple Vitamin (MULTIVITAMIN) capsule, Take 1 capsule by mouth daily., Disp: , Rfl:  .  SUMAtriptan (IMITREX) 100 MG tablet, TAKE 1 TAB AS NEEDWED FOR MIGRAINE, REPEAT IN 2 HRS IF PERSISTS OR RECURS, Disp: 9 tablet, Rfl: 0 .  verapamil (CALAN) 120 MG tablet, TAKE 1 TABLET (120 MG TOTAL) BY MOUTH 3 (THREE) TIMES DAILY., Disp: 90 tablet, Rfl: 1 .  diclofenac (VOLTAREN) 50 MG EC tablet, Take 1 tablet (50 mg total) by mouth 2 (two) times daily., Disp: 60 tablet, Rfl: 3 .  FLUoxetine (PROZAC) 20 MG capsule, Take 1 capsule (20 mg total) by mouth daily., Disp: 90 capsule, Rfl: 1   Allergies  Allergen Reactions  . Pregabalin Shortness Of Breath  . Phentermine Palpitations  . Ramipril Cough  . Statins     Myalgia   . Zocor [Simvastatin]   . Penicillins Rash    Review of Systems   ROS Negative unless otherwise specified per HPI.  Vitals:   Vitals:   09/11/19 0811  BP: 138/82  Pulse: (!) 59  Temp: 97.9 F (36.6 C)  TempSrc: Temporal  SpO2: 96%  Weight: 256 lb (116.1 kg)  Height: 5\' 9"  (1.753 m)     Body mass index is 37.8 kg/m.   Physical Exam:     Physical Exam Vitals and nursing note reviewed.  Constitutional:      General: He is not in acute distress.    Appearance: He is well-developed. He is not ill-appearing or toxic-appearing.  Cardiovascular:     Rate and Rhythm: Normal rate and regular rhythm.     Pulses: Normal pulses.     Heart sounds: Normal heart sounds, S1 normal and S2 normal.     Comments: No LE edema Pulmonary:     Effort: Pulmonary effort is normal.     Breath sounds: Normal breath sounds.  Musculoskeletal:     Comments: No decreased ROM 2/2 pain with flexion/extension, lateral side bends, or rotation. Reproducible tenderness with deep palpation to right thoracic paraspinal muscles. No bony tenderness.    Skin:    General: Skin is warm and dry.  Neurological:     Mental Status: He is alert.     GCS: GCS eye subscore is 4. GCS verbal subscore is 5. GCS motor subscore is 6.  Psychiatric:        Speech: Speech normal.        Behavior: Behavior normal. Behavior is cooperative.    Results for orders placed or performed in visit on 09/11/19  POCT HgB A1C  Result Value Ref Range   Hemoglobin A1C 5.5 4.0 - 5.6 %   HbA1c POC (<> result, manual entry)     HbA1c, POC (prediabetic range)     HbA1c, POC (controlled diabetic range)      Assessment and Plan:    Robert Gamble was seen today for hypertension, anxiety, back spasm and diabetes.  Diagnoses and all orders for this visit:  Type 2 diabetes mellitus without complication, without long-term current use of insulin ([facility]) HgbA1c improved. Discussed trialing stopping Metformin but he would like to continue at this time. Encouraged continued weight loss and reduction of concentrated sweets. -     POCT HgB A1C  Back spasm Overall controlled with muscle relaxers and diclofenac. Offered referral to sports med but he declined. Worsening precautions advised. -     diclofenac (VOLTAREN) 50 MG EC tablet; Take 1 tablet (50 mg total) by mouth 2 (two) times  daily.  Anxiety; Reactive depression Doing well. He would like to trial stopping Wellbutrin, I don't think this is unreasonable. Will discontinue. Advised as follows:  Stop Wellbutrin. If you'd like, you could consider taking every other day if you feel like you are having withdrawal symptoms. We can always increase Prozac if needed -- you have my permission to increase to 40 mg daily and if this is beneficial for you, let me know and will send in updated dose. -     FLUoxetine (PROZAC) 20 MG capsule; Take 1 capsule (20 mg total) by mouth daily.  HTN Overall controlled in office today. Will continue his current regimen. Follow-up if symptoms develop or at home readings consistently >150/90.   Other orders -     methocarbamol (ROBAXIN) 500 MG tablet; Take 1 tablet (500 mg total) by mouth 2 (two) times daily.  . Reviewed expectations re: course of current medical issues. . Discussed self-management of symptoms. . Outlined signs and symptoms indicating need for more acute intervention. . Patient verbalized understanding and all questions were answered. . See orders for this visit as documented in the electronic medical record. . Patient received an After Visit Summary.  CMA or LPN served as scribe during this visit. History, Physical, and Plan performed by medical provider. The above documentation has been reviewed and is accurate and complete.  Inda Coke, PA-C [place], [place] 09/11/2019  Follow-up: No follow-ups on file.

## 2019-09-15 ENCOUNTER — Other Ambulatory Visit: Payer: Self-pay | Admitting: Physician Assistant

## 2019-09-15 DIAGNOSIS — I152 Hypertension secondary to endocrine disorders: Secondary | ICD-10-CM

## 2019-09-21 ENCOUNTER — Other Ambulatory Visit: Payer: Self-pay | Admitting: Physician Assistant

## 2019-10-11 ENCOUNTER — Encounter: Payer: Self-pay | Admitting: Physician Assistant

## 2019-10-11 DIAGNOSIS — F419 Anxiety disorder, unspecified: Secondary | ICD-10-CM

## 2019-10-11 DIAGNOSIS — F329 Major depressive disorder, single episode, unspecified: Secondary | ICD-10-CM

## 2019-10-12 ENCOUNTER — Encounter: Payer: Self-pay | Admitting: *Deleted

## 2019-10-12 MED ORDER — BUPROPION HCL ER (XL) 150 MG PO TB24: 150.0000 mg | ORAL_TABLET | Freq: Every day | ORAL | 0 refills | Status: DC

## 2019-10-12 MED ORDER — FLUOXETINE HCL 20 MG PO CAPS: 20.0000 mg | ORAL_CAPSULE | Freq: Every day | ORAL | 1 refills | Status: DC

## 2019-10-12 NOTE — Telephone Encounter (Signed)
Please see message, I have refilled his medications he requested.

## 2019-11-04 DIAGNOSIS — G4733 Obstructive sleep apnea (adult) (pediatric): Secondary | ICD-10-CM | POA: Diagnosis not present

## 2019-12-31 ENCOUNTER — Encounter: Payer: Self-pay | Admitting: Physician Assistant

## 2019-12-31 NOTE — Progress Notes (Signed)
PATIENT: Robert Gamble DOB: 1975-10-20  REASON FOR VISIT: follow up HISTORY FROM: patient  Chief Complaint  Patient presents with  . Follow-up    Rm 1, alone. Last seen 01/01/2019 by AL,NP. OSA/CPAP follow up. No changes since last visit per pt. Past 8 weeks or so, does not feel as rested. Unsure what has changed.      HISTORY OF PRESENT ILLNESS: Today 01/04/20 Robert Gamble is a 44 y.o. male here today for follow up for OSA on CPAP. He continues to do well with CPAP therapy. He does note worsening fatigue over the past 8-10 weeks. He endorses more stress at work. He is followed closely by PCP. He feels mood is stable. He is sleeping well. No medication changes.   Compliance report dated 10/02/2019 through 12/30/2019 reveals that he used CPAP therapy 89 of the past 90 days for compliance of 89%.  He is CPAP greater than 4 hours 86 of the past 90 days for compliance of 95.6%.  Average usage was 7 hours and 22 minutes.  Residual AHI was 2.1 on 7 to 14 cm of water.  There was no significant leak noted.   HISTORY: (copied from my note on 01/01/2019)  Robert Gamble is a 44 y.o. male here today for follow up for mild OSA with prolonged hypoxia started on on CPAP recently.  He reports doing very well with CPAP therapy at home.  He is using a nasal pillow and has adjusted nicely.  Compliance report dated 12/01/2018 through 12/30/2018 reveals that he is using CPAP therapy every night for compliance of 100% every night he used CPAP greater than 4 hours for compliance of 100%.  Average usage was 7 hours and 23 minutes.  AHI was 3.3 on 7 to 14 cm of water and an EPR of 2.  There was no significant leak.  HISTORY: (copied from Dr Dohmeier's note on 07/29/2018)  OHY:WVPXT Robert Gamble a 44 y.o.maleIs seen here in a video guided telemedicine consultation aftera referral from Dr. Doreen Beam possible sleep apnea.  Robert Gamble is a 44 year old Caucasian gentleman  whose current chief complaint is a. Of 6 months during which he gets on average only 3 to 4 hours of nocturnal sleep. This all started before the coronavirus lockdown. He reports that he had a sleep study 7 years ago at [facility] was diagnosed with obstructive sleep apnea and prescribed CPAP. Within the next 24 months he lost 100 pounds and felt or was told that he does not need CPAP anymore. Unfortunately he has gained his weight back he reports a current weight of 272 pounds at a height of 5 foot 9 inches. His wife has started to hear him snore loudly again, and he struggles with daytime sleepiness and fatigue.  I reviewed all the patient's medications as provided on Dr. Lutricia Horsfall referral. The patient has a history of morbid obesity, hypertension associated with diabetes type 2, treated on 79medications, losartan, hydrochlorothiazide, verapamil. hasacute knee pain but also reports that this does not bother him as much at night only when he actually walks. He suffers from migraines and has been taking Naprosyn and Imitrex. Imitrex of course as needed.  He endorsed the PHQ 9 score for depression in February 2020 at 11 points and now on the new medication on 15 Jul 2018 at only 4 points for this seems to have been an significant improvement.  He has trouble with insomnia not initiating sleep but maintaining sleep,  and has been diagnosed with anxiety he takes Cymbalta and Lexapro. He has apparently been taking trazodone-0 50 mg tablets and tried a house by mouth at bedtime as needed for sleep he is also been on verapamil . He has been taking baclofen for spasm. Wellbutrin usedas a daytime booster.  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 He drinks 1 coffee in the morning and iced tea at dinner, alcohol is consumed very seldomly. He does not endorse any form of tobacco use and is a lifelong  non-smoker.  Sleep habits:The patient describes that family dinner is between 6 and 7:30 PM, bedtime is usually around 10 PM. He has no trouble falling asleep when he goes to bed the bedroom is cool but not quiet and dark for the first hour as his wife insists on watching TV. The TV however is on a timer and does not accompany the couple throughout the night. Habitually he sleeps or falls asleep in supine position but he wakes up on his side. He sleeps with 1-2 pillows, and he usually stays asleep for almost 4 hours before he wakes up sometimes it is nocturia that will wake him up but sometimes it is spontaneous and he is not aware of the trigger. He denies having any vivid dreams or nightmares, and while he has some problems with knee pain it does not seem to affect him at night, does not create PLM's or restless legs. However he has trouble to resume sleep again. When he rises in the morning around 6 he feels unrefreshed as if he needs more sleep. He avoids naps-he is basically physically active all day which helps him to overcome sleepiness,but as soon as he returns home after work and sits down to rest he can fall asleep inno time. TST is 3-4 hours only for 6 month .   REVIEW OF SYSTEMS: Out of a complete 14 system review of symptoms, the patient complains only of the following symptoms, fatigue and all other reviewed systems are negative.  ESS: 15   ALLERGIES: Allergies  Allergen Reactions  . Pregabalin Shortness Of Breath  . Phentermine Palpitations  . Ramipril Cough  . Statins     Myalgia   . Zocor [Simvastatin]   . Penicillins Rash    HOME MEDICATIONS: Outpatient Medications Prior to Visit  Medication Sig Dispense Refill  . buPROPion (WELLBUTRIN XL) 150 MG 24 hr tablet Take 1 tablet (150 mg total) by mouth daily. 90 tablet 0  . diclofenac (VOLTAREN) 50 MG EC tablet Take 1 tablet (50 mg total) by mouth 2 (two) times daily. 60 tablet 3  . FLUoxetine (PROZAC) 20 MG  capsule Take 1 capsule (20 mg total) by mouth daily. 90 capsule 1  . loratadine (CLARITIN) 10 MG tablet Take 10 mg by mouth daily.    Marland Kitchen losartan-hydrochlorothiazide (HYZAAR) 100-25 MG tablet TAKE 1 TABLET BY MOUTH EVERY DAY 30 tablet 5  . metFORMIN (GLUCOPHAGE) 500 MG tablet TAKE 1 TABLET (500 MG TOTAL) BY MOUTH 2 (TWO) TIMES DAILY WITH A MEAL. 60 tablet 5  . methocarbamol (ROBAXIN) 500 MG tablet Take 1 tablet (500 mg total) by mouth 2 (two) times daily. 60 tablet 1  . metoprolol succinate (TOPROL-XL) 25 MG 24 hr tablet TAKE 1 TABLET BY MOUTH EVERY DAY 30 tablet 5  . Multiple Vitamin (MULTIVITAMIN) capsule Take 1 capsule by mouth daily.    . SUMAtriptan (IMITREX) 100 MG  tablet TAKE 1 TAB AS NEEDWED FOR MIGRAINE, REPEAT IN 2 HRS IF PERSISTS OR RECURS 9 tablet 0  . verapamil (CALAN) 120 MG tablet TAKE 1 TABLET (120 MG TOTAL) BY MOUTH 3 (THREE) TIMES DAILY. 90 tablet 1   No facility-administered medications prior to visit.    PAST MEDICAL HISTORY: Past Medical History:  Diagnosis Date  . Allergic rhinitis   . Chronic headache   . DM2 (diabetes mellitus, type 2) ([place])   . HTN (hypertension)   . OSA on CPAP     PAST SURGICAL HISTORY: Past Surgical History:  Procedure Laterality Date  . APPENDECTOMY      FAMILY HISTORY: Family History  Problem Relation Age of Onset  . Hypertension Father   . Sleep apnea Sister   . Diabetes Sister   . Hyperlipidemia Sister   . Hypertension Sister   . Mental illness Sister   . Lung cancer Other   . Clotting disorder Other   . Emphysema Other   . Heart disease Other   . Cancer Maternal Grandmother   . Cancer Maternal Grandfather     SOCIAL HISTORY: Social History   Socioeconomic History  . Marital status: Married    Spouse name: Not on file  . Number of children: Not on file  . Years of education: Not on file  . Highest education level: Not on file  Occupational History  . Not on file  Tobacco Use  . Smoking status: Former Research scientist (life sciences)  .  Smokeless tobacco: Never Used  Vaping Use  . Vaping Use: Never used  Substance and Sexual Activity  . Alcohol use: No  . Drug use: No  . Sexual activity: Yes    Birth control/protection: Rhythm  Other Topics Concern  . Not on file  Social History Narrative   Quit ETOH, Street drugs   Married, daughter   Patient is a current smoker, 1/2ppd   Quit ETOH, Street drugs         Social Determinants of Radio broadcast assistant Strain:   . Difficulty of Paying Living Expenses: Not on file  Food Insecurity:   . Worried About Charity fundraiser in the Last Year: Not on file  . Ran Out of Food in the Last Year: Not on file  Transportation Needs:   . Lack of Transportation (Medical): Not on file  . Lack of Transportation (Non-Medical): Not on file  Physical Activity:   . Days of Exercise per Week: Not on file  . Minutes of Exercise per Session: Not on file  Stress:   . Feeling of Stress : Not on file  Social Connections:   . Frequency of Communication with Friends and Family: Not on file  . Frequency of Social Gatherings with Friends and Family: Not on file  . Attends Religious Services: Not on file  . Active Member of Clubs or Organizations: Not on file  . Attends Archivist Meetings: Not on file  . Marital Status: Not on file  Intimate Partner Violence:   . Fear of Current or Ex-Partner: Not on file  . Emotionally Abused: Not on file  . Physically Abused: Not on file  . Sexually Abused: Not on file     PHYSICAL EXAM  Vitals:   01/04/20 0722  BP: (!) 145/78  Pulse: 68  SpO2: 98%  Weight: 272 lb 3.2 oz (123.5 kg)   Body mass index is 40.2 kg/m.  Generalized: Well developed, in no acute distress  Cardiology:  normal rate and rhythm, no murmur noted Respiratory: clear to auscultation bilaterally  Neurological examination  Mentation: Alert oriented to time, place, history taking. Follows all commands speech and language fluent Cranial nerve II-XII: Pupils  were equal round reactive to light. Extraocular movements were full, visual field were full  Motor: The motor testing reveals 5 over 5 strength of all 4 extremities. Good symmetric motor tone is noted throughout.  Gait and station: Gait is normal.    DIAGNOSTIC DATA (LABS, IMAGING, TESTING) - I reviewed patient records, labs, notes, testing and imaging myself where available.  No flowsheet data found.   Lab Results  Component Value Date   WBC 6.3 02/06/2019   HGB 14.6 02/06/2019   HCT 42.5 02/06/2019   MCV 88.6 02/06/2019   PLT 295.0 02/06/2019      Component Value Date/Time   NA 140 02/06/2019 1106   K 3.9 02/06/2019 1106   CL 100 02/06/2019 1106   CO2 31 02/06/2019 1106   GLUCOSE 108 (H) 02/06/2019 1106   BUN 13 02/06/2019 1106   CREATININE 0.79 02/06/2019 1106   CALCIUM 9.9 02/06/2019 1106   PROT 8.1 02/06/2019 1106   ALBUMIN 4.7 02/06/2019 1106   AST 27 02/06/2019 1106   ALT 37 02/06/2019 1106   ALKPHOS 54 02/06/2019 1106   BILITOT 0.5 02/06/2019 1106   GFRNONAA >60 06/06/2018 2033   GFRAA >60 06/06/2018 2033   Lab Results  Component Value Date   CHOL 198 02/06/2019   HDL 40.90 02/06/2019   LDLCALC 52 06/17/2017   LDLDIRECT 133.0 02/06/2019   TRIG 226.0 (H) 02/06/2019   CHOLHDL 5 02/06/2019   Lab Results  Component Value Date   HGBA1C 5.5 09/11/2019   Lab Results  Component Value Date   VITAMINB12 606 02/11/2017   Lab Results  Component Value Date   TSH 1.60 07/03/2016     ASSESSMENT AND PLAN 43 y.o. year old male  has a past medical history of Allergic rhinitis, Chronic headache, DM2 (diabetes mellitus, type 2) ([place]), HTN (hypertension), and OSA on CPAP. here with     ICD-10-CM   1. OSA on CPAP  G47.33 For home use only DME continuous positive airway pressure (CPAP)   Z99.89   2. Other fatigue  R53.83      Joseh Sjogren is doing well on CPAP therapy.  Compliance report reveals excellent compliance.  He does report worsening fatigue over  the past few months.  No obvious triggers.  He does note increased stress at work.  He was encouraged to follow-up closely with primary care.  Healthy lifestyle habits reviewed. He was encouraged to continue using CPAP nightly and for greater than 4 hours each night. We will update supply orders as indicated. Risks of untreated sleep apnea review and education materials provided. Healthy lifestyle habits encouraged. He will follow up in 1 year, sooner if needed. He verbalizes understanding and agreement with this plan.    Orders Placed This Encounter  Procedures  . For home use only DME continuous positive airway pressure (CPAP)    Supplies    Order Specific Question:   Length of Need    Answer:   Lifetime    Order Specific Question:   Patient has OSA or probable OSA    Answer:   Yes    Order Specific Question:   Is the patient currently using CPAP in the home    Answer:   Yes    Order Specific Question:  Settings    Answer:   Other see comments    Order Specific Question:   CPAP supplies needed    Answer:   Mask, headgear, cushions, filters, heated tubing and water chamber     No orders of the defined types were placed in this encounter.     I spent 15 minutes with the patient. 50% of this time was spent counseling and educating patient on plan of care and medications.    Debbora Presto, FNP-C 01/04/2020, 7:57 AM [facility] [address] [place], [place] [address] [phone]

## 2020-01-04 ENCOUNTER — Other Ambulatory Visit: Payer: Self-pay

## 2020-01-04 ENCOUNTER — Encounter: Payer: Self-pay | Admitting: Family Medicine

## 2020-01-04 ENCOUNTER — Ambulatory Visit (INDEPENDENT_AMBULATORY_CARE_PROVIDER_SITE_OTHER): Payer: BC Managed Care – PPO | Admitting: Family Medicine

## 2020-01-04 VITALS — BP 145/78 | HR 68 | Wt 272.2 lb

## 2020-01-04 DIAGNOSIS — R5383 Other fatigue: Secondary | ICD-10-CM

## 2020-01-04 DIAGNOSIS — Z9989 Dependence on other enabling machines and devices: Secondary | ICD-10-CM

## 2020-01-04 DIAGNOSIS — G4733 Obstructive sleep apnea (adult) (pediatric): Secondary | ICD-10-CM | POA: Diagnosis not present

## 2020-01-04 NOTE — Patient Instructions (Signed)
Please continue using your CPAP regularly. While your insurance requires that you use CPAP at least 4 hours each night on 70% of the nights, I recommend, that you not skip any nights and use it throughout the night if you can. Getting used to CPAP and staying with the treatment long term does take time and patience and discipline. Untreated obstructive sleep apnea when it is moderate to severe can have an adverse impact on cardiovascular health and raise her risk for heart disease, arrhythmias, hypertension, congestive heart failure, stroke and diabetes. Untreated obstructive sleep apnea causes sleep disruption, nonrestorative sleep, and sleep deprivation. This can have an impact on your day to day functioning and cause daytime sleepiness and impairment of cognitive function, memory loss, mood disturbance, and problems focussing. Using CPAP regularly can improve these symptoms.  Stay well hydrated. Focus on eating well balanced meals and getting regular exercise. Follow up closely with PCP.   Follow up in 1 year   Fatigue If you have fatigue, you feel tired all the time and have a lack of energy or a lack of motivation. Fatigue may make it difficult to start or complete tasks because of exhaustion. In general, occasional or mild fatigue is often a normal response to activity or life. However, long-lasting (chronic) or extreme fatigue may be a symptom of a medical condition. Follow these instructions at home: General instructions  Watch your fatigue for any changes.  Go to bed and get up at the same time every day.  Avoid fatigue by pacing yourself during the day and getting enough sleep at night.  Maintain a healthy weight. Medicines  Take over-the-counter and prescription medicines only as told by your health care provider.  Take a multivitamin, if told by your health care provider.  Do not use herbal or dietary supplements unless they are approved by your health care  provider. Activity   Exercise regularly, as told by your health care provider.  Use or practice techniques to help you relax, such as yoga, tai chi, meditation, or massage therapy. Eating and drinking   Avoid heavy meals in the evening.  Eat a well-balanced diet, which includes lean proteins, whole grains, plenty of fruits and vegetables, and low-fat dairy products.  Avoid consuming too much caffeine.  Avoid the use of alcohol.  Drink enough fluid to keep your urine pale yellow. Lifestyle  Change situations that cause you stress. Try to keep your work and personal schedule in balance.  Do not use any products that contain nicotine or tobacco, such as cigarettes and e-cigarettes. If you need help quitting, ask your health care provider.  Do not use drugs. Contact a health care provider if:  Your fatigue does not get better.  You have a fever.  You suddenly lose or gain weight.  You have headaches.  You have trouble falling asleep or sleeping through the night.  You feel angry, guilty, anxious, or sad.  You are unable to have a bowel movement (constipation).  Your skin is dry.  You have swelling in your legs or another part of your body. Get help right away if:  You feel confused.  Your vision is blurry.  You feel faint or you pass out.  You have a severe headache.  You have severe pain in your abdomen, your back, or the area between your waist and hips (pelvis).  You have chest pain, shortness of breath, or an irregular or fast heartbeat.  You are unable to urinate, or you  urinate less than normal.  You have abnormal bleeding, such as bleeding from the rectum, vagina, nose, lungs, or nipples.  You vomit blood.  You have thoughts about hurting yourself or others. If you ever feel like you may hurt yourself or others, or have thoughts about taking your own life, get help right away. You can go to your nearest emergency department or call:  Your local  emergency services (911 in the [place]  A suicide crisis helpline, such as the [facility] at 905-380-1858. This is open 24 hours a day. Summary  If you have fatigue, you feel tired all the time and have a lack of energy or a lack of motivation.  Fatigue may make it difficult to start or complete tasks because of exhaustion.  Long-lasting (chronic) or extreme fatigue may be a symptom of a medical condition.  Exercise regularly, as told by your health care provider.  Change situations that cause you stress. Try to keep your work and personal schedule in balance. This information is not intended to replace advice given to you by your health care provider. Make sure you discuss any questions you have with your health care provider. Document Revised: 09/03/2018 Document Reviewed: 11/07/2016 Elsevier Patient Education  [place].   Sleep Apnea Sleep apnea affects breathing during sleep. It causes breathing to stop for a short time or to become shallow. It can also increase the risk of:  Heart attack.  Stroke.  Being very overweight (obese).  Diabetes.  Heart failure.  Irregular heartbeat. The goal of treatment is to help you breathe normally again. What are the causes? There are three kinds of sleep apnea:  Obstructive sleep apnea. This is caused by a blocked or collapsed airway.  Central sleep apnea. This happens when the brain does not send the right signals to the muscles that control breathing.  Mixed sleep apnea. This is a combination of obstructive and central sleep apnea. The most common cause of this condition is a collapsed or blocked airway. This can happen if:  Your throat muscles are too relaxed.  Your tongue and tonsils are too large.  You are overweight.  Your airway is too small. What increases the risk?  Being overweight.  Smoking.  Having a small airway.  Being older.  Being male.  Drinking  alcohol.  Taking medicines to calm yourself (sedatives or tranquilizers).  Having family members with the condition. What are the signs or symptoms?  Trouble staying asleep.  Being sleepy or tired during the day.  Getting angry a lot.  Loud snoring.  Headaches in the morning.  Not being able to focus your mind (concentrate).  Forgetting things.  Less interest in sex.  Mood swings.  Personality changes.  Feelings of sadness (depression).  Waking up a lot during the night to pee (urinate).  Dry mouth.  Sore throat. How is this diagnosed?  Your medical history.  A physical exam.  A test that is done when you are sleeping (sleep study). The test is most often done in a sleep lab but may also be done at home. How is this treated?   Sleeping on your side.  Using a medicine to get rid of mucus in your nose (decongestant).  Avoiding the use of alcohol, medicines to help you relax, or certain pain medicines (narcotics).  Losing weight, if needed.  Changing your diet.  Not smoking.  Using a machine to open your airway while you sleep, such as: ? An  oral appliance. This is a mouthpiece that shifts your lower jaw forward. ? A CPAP device. This device blows air through a mask when you breathe out (exhale). ? An EPAP device. This has valves that you put in each nostril. ? A BPAP device. This device blows air through a mask when you breathe in (inhale) and breathe out.  Having surgery if other treatments do not work. It is important to get treatment for sleep apnea. Without treatment, it can lead to:  High blood pressure.  Coronary artery disease.  In men, not being able to have an erection (impotence).  Reduced thinking ability. Follow these instructions at home: Lifestyle  Make changes that your doctor recommends.  Eat a healthy diet.  Lose weight if needed.  Avoid alcohol, medicines to help you relax, and some pain medicines.  Do not use any  products that contain nicotine or tobacco, such as cigarettes, e-cigarettes, and chewing tobacco. If you need help quitting, ask your doctor. General instructions  Take over-the-counter and prescription medicines only as told by your doctor.  If you were given a machine to use while you sleep, use it only as told by your doctor.  If you are having surgery, make sure to tell your doctor you have sleep apnea. You may need to bring your device with you.  Keep all follow-up visits as told by your doctor. This is important. Contact a doctor if:  The machine that you were given to use during sleep bothers you or does not seem to be working.  You do not get better.  You get worse. Get help right away if:  Your chest hurts.  You have trouble breathing in enough air.  You have an uncomfortable feeling in your back, arms, or stomach.  You have trouble talking.  One side of your body feels weak.  A part of your face is hanging down. These symptoms may be an emergency. Do not wait to see if the symptoms will go away. Get medical help right away. Call your local emergency services (911 in the [place] Do not drive yourself to the hospital. Summary  This condition affects breathing during sleep.  The most common cause is a collapsed or blocked airway.  The goal of treatment is to help you breathe normally while you sleep. This information is not intended to replace advice given to you by your health care provider. Make sure you discuss any questions you have with your health care provider. Document Revised: 11/29/2017 Document Reviewed: 10/08/2017 Elsevier Patient Education  [place].

## 2020-01-05 ENCOUNTER — Other Ambulatory Visit: Payer: Self-pay | Admitting: Physician Assistant

## 2020-01-05 DIAGNOSIS — F329 Major depressive disorder, single episode, unspecified: Secondary | ICD-10-CM

## 2020-01-05 DIAGNOSIS — M62830 Muscle spasm of back: Secondary | ICD-10-CM

## 2020-01-05 NOTE — Progress Notes (Signed)
Order for cpap supplies sent to [facility] via community msg. Confirmation received that the order transmitted was successful.  

## 2020-01-26 ENCOUNTER — Other Ambulatory Visit: Payer: Self-pay | Admitting: Physician Assistant

## 2020-01-26 DIAGNOSIS — I152 Hypertension secondary to endocrine disorders: Secondary | ICD-10-CM

## 2020-01-26 DIAGNOSIS — E1159 Type 2 diabetes mellitus with other circulatory complications: Secondary | ICD-10-CM

## 2020-02-02 DIAGNOSIS — G4733 Obstructive sleep apnea (adult) (pediatric): Secondary | ICD-10-CM | POA: Diagnosis not present

## 2020-02-13 ENCOUNTER — Other Ambulatory Visit: Payer: Self-pay | Admitting: Physician Assistant

## 2020-02-19 ENCOUNTER — Other Ambulatory Visit: Payer: Self-pay | Admitting: Physician Assistant

## 2020-02-19 DIAGNOSIS — E1159 Type 2 diabetes mellitus with other circulatory complications: Secondary | ICD-10-CM

## 2020-02-19 DIAGNOSIS — I152 Hypertension secondary to endocrine disorders: Secondary | ICD-10-CM

## 2020-03-13 ENCOUNTER — Other Ambulatory Visit: Payer: Self-pay | Admitting: Physician Assistant

## 2020-03-18 ENCOUNTER — Ambulatory Visit: Payer: BC Managed Care – PPO | Admitting: Physician Assistant

## 2020-03-22 ENCOUNTER — Other Ambulatory Visit: Payer: Self-pay | Admitting: Physician Assistant

## 2020-03-23 MED ORDER — LOSARTAN POTASSIUM 100 MG PO TABS: 100.0000 mg | ORAL_TABLET | Freq: Every day | ORAL | 1 refills | Status: DC

## 2020-03-23 MED ORDER — HYDROCHLOROTHIAZIDE 25 MG PO TABS: 25.0000 mg | ORAL_TABLET | Freq: Every day | ORAL | 1 refills | Status: DC

## 2020-03-27 ENCOUNTER — Other Ambulatory Visit: Payer: Self-pay | Admitting: Physician Assistant

## 2020-03-27 DIAGNOSIS — I152 Hypertension secondary to endocrine disorders: Secondary | ICD-10-CM

## 2020-03-27 DIAGNOSIS — E1159 Type 2 diabetes mellitus with other circulatory complications: Secondary | ICD-10-CM

## 2020-03-28 ENCOUNTER — Other Ambulatory Visit: Payer: Self-pay | Admitting: Physician Assistant

## 2020-03-31 ENCOUNTER — Other Ambulatory Visit: Payer: Self-pay | Admitting: Physician Assistant

## 2020-03-31 DIAGNOSIS — F329 Major depressive disorder, single episode, unspecified: Secondary | ICD-10-CM

## 2020-04-01 ENCOUNTER — Ambulatory Visit (INDEPENDENT_AMBULATORY_CARE_PROVIDER_SITE_OTHER): Payer: BC Managed Care – PPO | Admitting: Physician Assistant

## 2020-04-01 ENCOUNTER — Encounter: Payer: Self-pay | Admitting: Physician Assistant

## 2020-04-01 ENCOUNTER — Other Ambulatory Visit: Payer: Self-pay

## 2020-04-01 VITALS — BP 126/70 | HR 58 | Temp 97.8°F | Ht 69.0 in | Wt 281.0 lb

## 2020-04-01 DIAGNOSIS — E1159 Type 2 diabetes mellitus with other circulatory complications: Secondary | ICD-10-CM

## 2020-04-01 DIAGNOSIS — I152 Hypertension secondary to endocrine disorders: Secondary | ICD-10-CM | POA: Diagnosis not present

## 2020-04-01 DIAGNOSIS — E1169 Type 2 diabetes mellitus with other specified complication: Secondary | ICD-10-CM

## 2020-04-01 DIAGNOSIS — E785 Hyperlipidemia, unspecified: Secondary | ICD-10-CM

## 2020-04-01 DIAGNOSIS — R0789 Other chest pain: Secondary | ICD-10-CM | POA: Diagnosis not present

## 2020-04-01 DIAGNOSIS — E119 Type 2 diabetes mellitus without complications: Secondary | ICD-10-CM

## 2020-04-01 LAB — CBC WITH DIFFERENTIAL/PLATELET
Basophils Absolute: 0.1 10*3/uL (ref 0.0–0.1)
Basophils Relative: 1.1 % (ref 0.0–3.0)
Eosinophils Absolute: 0.5 10*3/uL (ref 0.0–0.7)
Eosinophils Relative: 6.3 % — ABNORMAL HIGH (ref 0.0–5.0)
HCT: 40.9 % (ref 39.0–52.0)
Hemoglobin: 14.2 g/dL (ref 13.0–17.0)
Lymphocytes Relative: 25.4 % (ref 12.0–46.0)
Lymphs Abs: 1.9 10*3/uL (ref 0.7–4.0)
MCHC: 34.7 g/dL (ref 30.0–36.0)
MCV: 87.9 fl (ref 78.0–100.0)
Monocytes Absolute: 0.7 10*3/uL (ref 0.1–1.0)
Monocytes Relative: 10 % (ref 3.0–12.0)
Neutro Abs: 4.3 10*3/uL (ref 1.4–7.7)
Neutrophils Relative %: 57.2 % (ref 43.0–77.0)
Platelets: 284 10*3/uL (ref 150.0–400.0)
RBC: 4.66 Mil/uL (ref 4.22–5.81)
RDW: 14 % (ref 11.5–15.5)
WBC: 7.4 10*3/uL (ref 4.0–10.5)

## 2020-04-01 LAB — COMPREHENSIVE METABOLIC PANEL
ALT: 30 U/L (ref 0–53)
AST: 25 U/L (ref 0–37)
Albumin: 4.4 g/dL (ref 3.5–5.2)
Alkaline Phosphatase: 54 U/L (ref 39–117)
BUN: 13 mg/dL (ref 6–23)
CO2: 33 mEq/L — ABNORMAL HIGH (ref 19–32)
Calcium: 9.9 mg/dL (ref 8.4–10.5)
Chloride: 101 mEq/L (ref 96–112)
Creatinine, Ser: 0.85 mg/dL (ref 0.40–1.50)
GFR: 106.32 mL/min (ref >60.00)
Glucose, Bld: 106 mg/dL — ABNORMAL HIGH (ref 70–99)
Potassium: 3.9 mEq/L (ref 3.5–5.1)
Sodium: 140 mEq/L (ref 135–145)
Total Bilirubin: 0.5 mg/dL (ref 0.2–1.2)
Total Protein: 7.8 g/dL (ref 6.0–8.3)

## 2020-04-01 LAB — LIPID PANEL
Cholesterol: 169 mg/dL (ref 0–200)
HDL: 36.4 mg/dL — ABNORMAL LOW (ref >39.00)
LDL Cholesterol: 97 mg/dL (ref 0–99)
NonHDL: 132.32
Total CHOL/HDL Ratio: 5
Triglycerides: 175 mg/dL — ABNORMAL HIGH (ref 0.0–149.0)
VLDL: 35 mg/dL (ref 0.0–40.0)

## 2020-04-01 LAB — HEMOGLOBIN A1C: Hgb A1c MFr Bld: 6.4 % (ref 4.6–6.5)

## 2020-04-01 MED ORDER — MELOXICAM 15 MG PO TABS
15.0000 mg | ORAL_TABLET | Freq: Every day | ORAL | 0 refills | Status: DC
Start: 2020-04-01 — End: 2020-04-25

## 2020-04-01 NOTE — Patient Instructions (Signed)
It was great to see you!  Update blood work today.  Trial daily mobic 15 mg for your symptoms. If no improvement or any new/worsening symptoms, let me know.  Let's follow-up in 9 months, sooner if you have concerns.  Take care,  Inda Coke PA-C

## 2020-04-01 NOTE — Progress Notes (Signed)
Robert Gamble is a 45 y.o. male here for a new problem.   History of Present Illness:   Chief Complaint  Patient presents with  . Follow-up    Sharp pain at mid chest when sneezing and with certain movements x 2 weeks    HPI  Sternal pain Symptoms have been going on for weeks. Stabbing pain to mid chest with certain movements (mostly back extension) and sneezing. Lasts only during the movement or sneezing. Pain is 2/10 with back movement and 8/10 with sneezing. Denies: dizziness, SOB, lightheadedness, n/v/d.  Wt Readings from Last 5 Encounters:  04/01/20 281 lb (127.5 kg)  01/04/20 272 lb 3.2 oz (123.5 kg)  09/11/19 256 lb (116.1 kg)  03/13/19 261 lb 6.1 oz (118.6 kg)  02/06/19 265 lb 6.1 oz (120.4 kg)    Diabetes 6 month follow-up. Current DM meds: none. Blood sugars at home are: not checked. Patient is compliant with medications. Denies: hypoglycemic or hyperglycemic episodes or symptoms. This patient's diabetes is complicated by HTN.  Lab Results  Component Value Date   HGBA1C 5.5 09/11/2019    HTN Currently taking Verapamil 120 mg TID, Metoprolol XL 25 mg daily,  Losartan 100 mg, HCTZ 25 mg. At home blood pressure readings are: not checked. Patient denies chest pain, SOB, blurred vision, dizziness, unusual headaches, lower leg swelling. Patient is compliant with medication. Denies excessive caffeine intake, stimulant usage, excessive alcohol intake, or increase in salt consumption.  BP Readings from Last 3 Encounters:  04/01/20 126/70  01/04/20 (!) 145/78  09/11/19 138/82    HLD No medications at this time. He cannot tolerate statins. Has recently gained weight, wife is starting MWM soon and he hopes to work on dietary changes with her.   Past Medical History:  Diagnosis Date  . Allergic rhinitis   . Chronic headache   . DM2 (diabetes mellitus, type 2) ([place])   . HTN (hypertension)   . OSA on CPAP      Social History   Tobacco Use  . Smoking status:  Former Research scientist (life sciences)  . Smokeless tobacco: Never Used  Vaping Use  . Vaping Use: Never used  Substance Use Topics  . Alcohol use: No  . Drug use: No    Past Surgical History:  Procedure Laterality Date  . APPENDECTOMY      Family History  Problem Relation Age of Onset  . Hypertension Father   . Sleep apnea Sister   . Diabetes Sister   . Hyperlipidemia Sister   . Hypertension Sister   . Mental illness Sister   . Lung cancer Other   . Clotting disorder Other   . Emphysema Other   . Heart disease Other   . Cancer Maternal Grandmother   . Cancer Maternal Grandfather     Allergies  Allergen Reactions  . Pregabalin Shortness Of Breath  . Phentermine Palpitations  . Ramipril Cough  . Statins     Myalgia   . Zocor [Simvastatin]   . Penicillins Rash    Current Medications:   Current Outpatient Medications:  .  buPROPion (WELLBUTRIN XL) 150 MG 24 hr tablet, TAKE 1 TABLET BY MOUTH EVERY DAY, Disp: 30 tablet, Rfl: 2 .  hydrochlorothiazide (HYDRODIURIL) 25 MG tablet, Take 1 tablet (25 mg total) by mouth daily., Disp: 90 tablet, Rfl: 1 .  loratadine (CLARITIN) 10 MG tablet, Take 10 mg by mouth daily., Disp: , Rfl:  .  losartan (COZAAR) 100 MG tablet, Take 1 tablet (100 mg total) by  mouth daily., Disp: 90 tablet, Rfl: 1 .  meloxicam (MOBIC) 15 MG tablet, Take 1 tablet (15 mg total) by mouth daily., Disp: 30 tablet, Rfl: 0 .  methocarbamol (ROBAXIN) 500 MG tablet, TAKE 1 TABLET BY MOUTH TWICE A DAY, Disp: 60 tablet, Rfl: 1 .  metoprolol succinate (TOPROL-XL) 25 MG 24 hr tablet, TAKE 1 TABLET BY MOUTH EVERY DAY, Disp: 30 tablet, Rfl: 5 .  Multiple Vitamin (MULTIVITAMIN) capsule, Take 1 capsule by mouth daily., Disp: , Rfl:  .  SUMAtriptan (IMITREX) 100 MG tablet, TAKE 1 TAB AS NEEDWED FOR MIGRAINE, REPEAT IN 2 HRS IF PERSISTS OR RECURS, Disp: 9 tablet, Rfl: 0 .  verapamil (CALAN) 120 MG tablet, TAKE 1 TABLET (120 MG TOTAL) BY MOUTH 3 (THREE) TIMES DAILY., Disp: 90 tablet, Rfl: 1    Review of Systems:   ROS  Negative unless otherwise specified per HPI.  Vitals:   Vitals:   04/01/20 0820  BP: 126/70  Pulse: (!) 58  Temp: 97.8 F (36.6 C)  TempSrc: Oral  SpO2: 97%  Weight: 281 lb (127.5 kg)  Height: 5\' 9"  (1.753 m)     Body mass index is 41.5 kg/m.   L arm: 132/80 pulse 53 R arm: 130/78 pulse 54  Physical Exam:   Physical Exam Vitals and nursing note reviewed.  Constitutional:      General: He is not in acute distress.    Appearance: He is well-developed. He is not ill-appearing, toxic-appearing or sickly-appearing.  Cardiovascular:     Rate and Rhythm: Normal rate and regular rhythm.     Pulses: Normal pulses.     Heart sounds: Normal heart sounds, S1 normal and S2 normal.     Comments: No LE edema Pulmonary:     Effort: Pulmonary effort is normal.     Breath sounds: Normal breath sounds.  Abdominal:     General: Abdomen is flat. Bowel sounds are normal.     Palpations: Abdomen is soft.     Tenderness: There is no abdominal tenderness. There is no right CVA tenderness or left CVA tenderness.  Musculoskeletal:     Comments: No reproducible chest pain.  Skin:    General: Skin is warm, dry and intact.  Neurological:     Mental Status: He is alert.     GCS: GCS eye subscore is 4. GCS verbal subscore is 5. GCS motor subscore is 6.  Psychiatric:        Mood and Affect: Mood and affect normal.        Speech: Speech normal.        Behavior: Behavior normal. Behavior is cooperative.       Assessment and Plan:   Robert Gamble was seen today for follow-up.  Diagnoses and all orders for this visit:  Type 2 diabetes mellitus without complication Not on medications, GI tract is improved without medication. Update A1c. As long as in goal, will not restart medications. -     Hemoglobin A1c  Hypertension associated with diabetes ([place]), on Losartan, HCTZ, Verapamil Well controlled. Continue current BP regimen of Losartan 100 mg daily, HCTZ 25 mg  daily, Verapamil 120 mg TID, and Metoprolol 25 mg XR. Follow-up in 9 months. -     CBC with Differential/Platelet -     Comprehensive metabolic panel -     Lipid panel  Mid sternal chest pain No red flags on exam. No cardiac/exertional component. Start daily mobic 15 mg and follow-up if no improvement or any changes in  2 weeks.  Hyperlipidemia associated with type 2 diabetes mellitus ([place]) Update blood work. Statin intolerant per hx. Work on better [facility].  Other orders -     meloxicam (MOBIC) 15 MG tablet; Take 1 tablet (15 mg total) by mouth daily.  CMA or LPN served as scribe during this visit. History, Physical, and Plan performed by medical provider. The above documentation has been reviewed and is accurate and complete.   Inda Coke, PA-C

## 2020-04-02 IMAGING — DX LEFT SHOULDER - 2+ VIEW
3 series · 3 of 3 positions shown · non-contrast
Comparison: None.

CLINICAL DATA: Left shoulder pain

EXAM:
LEFT SHOULDER - 2+ VIEW

[shoulder grashey]
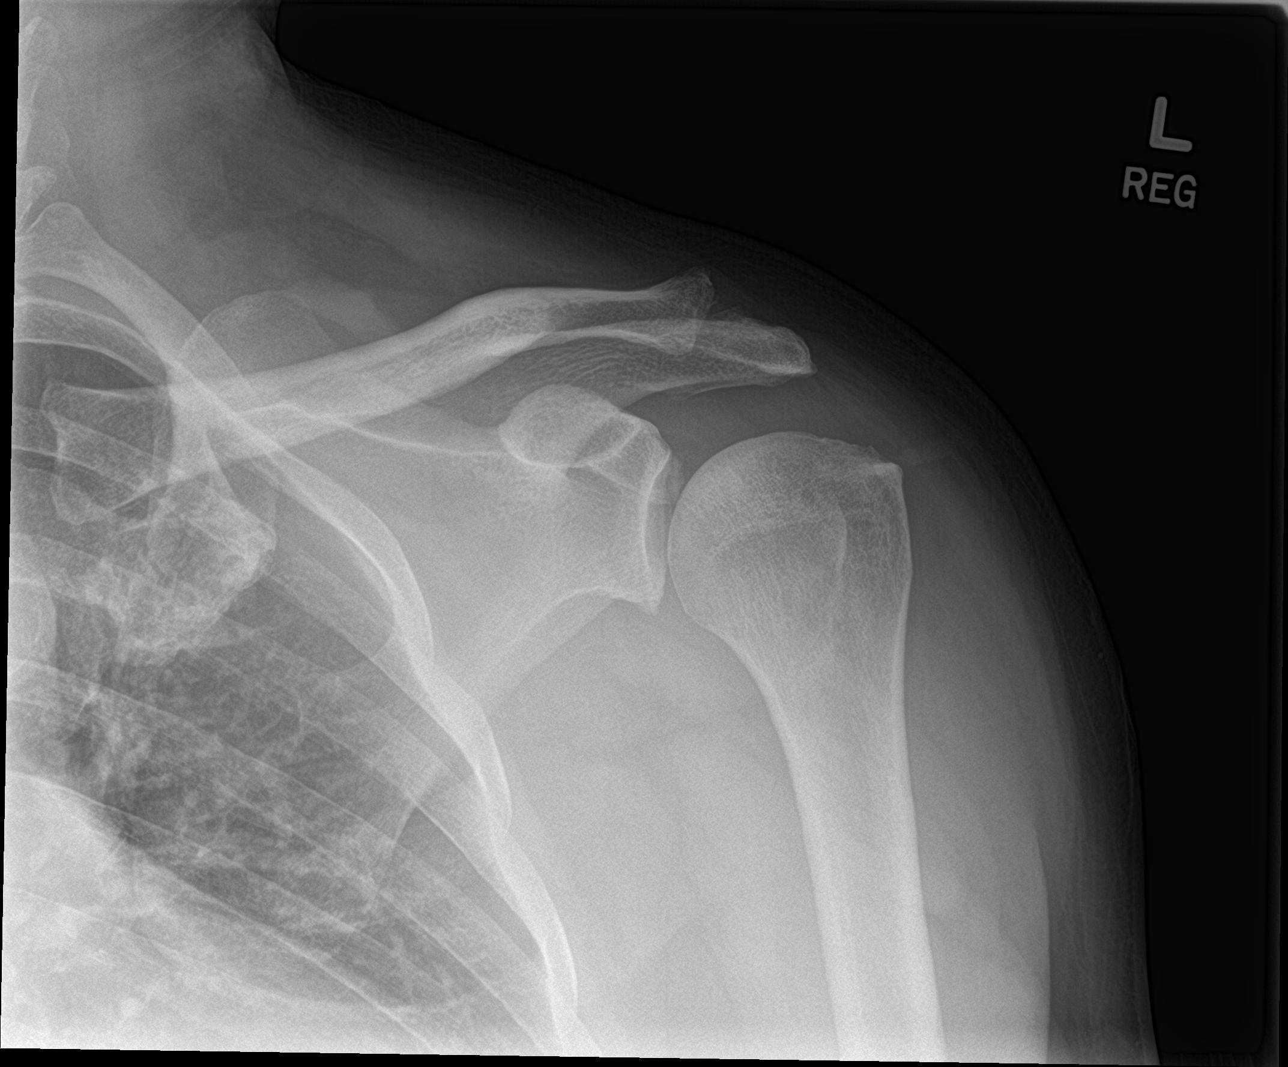

[shoulder y view]
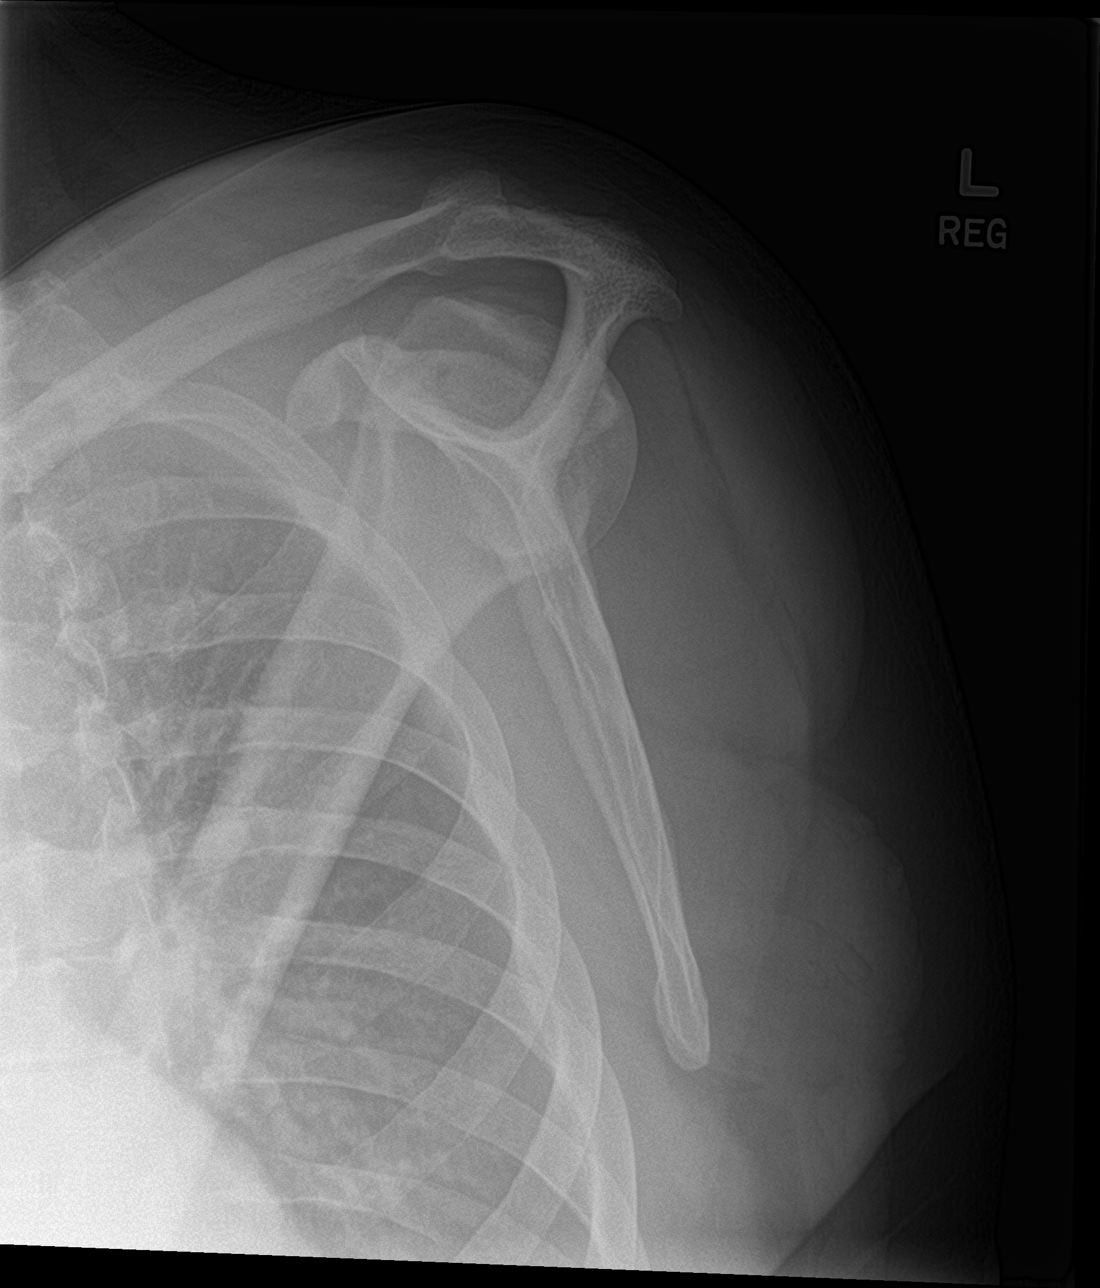

[shoulder axillary]
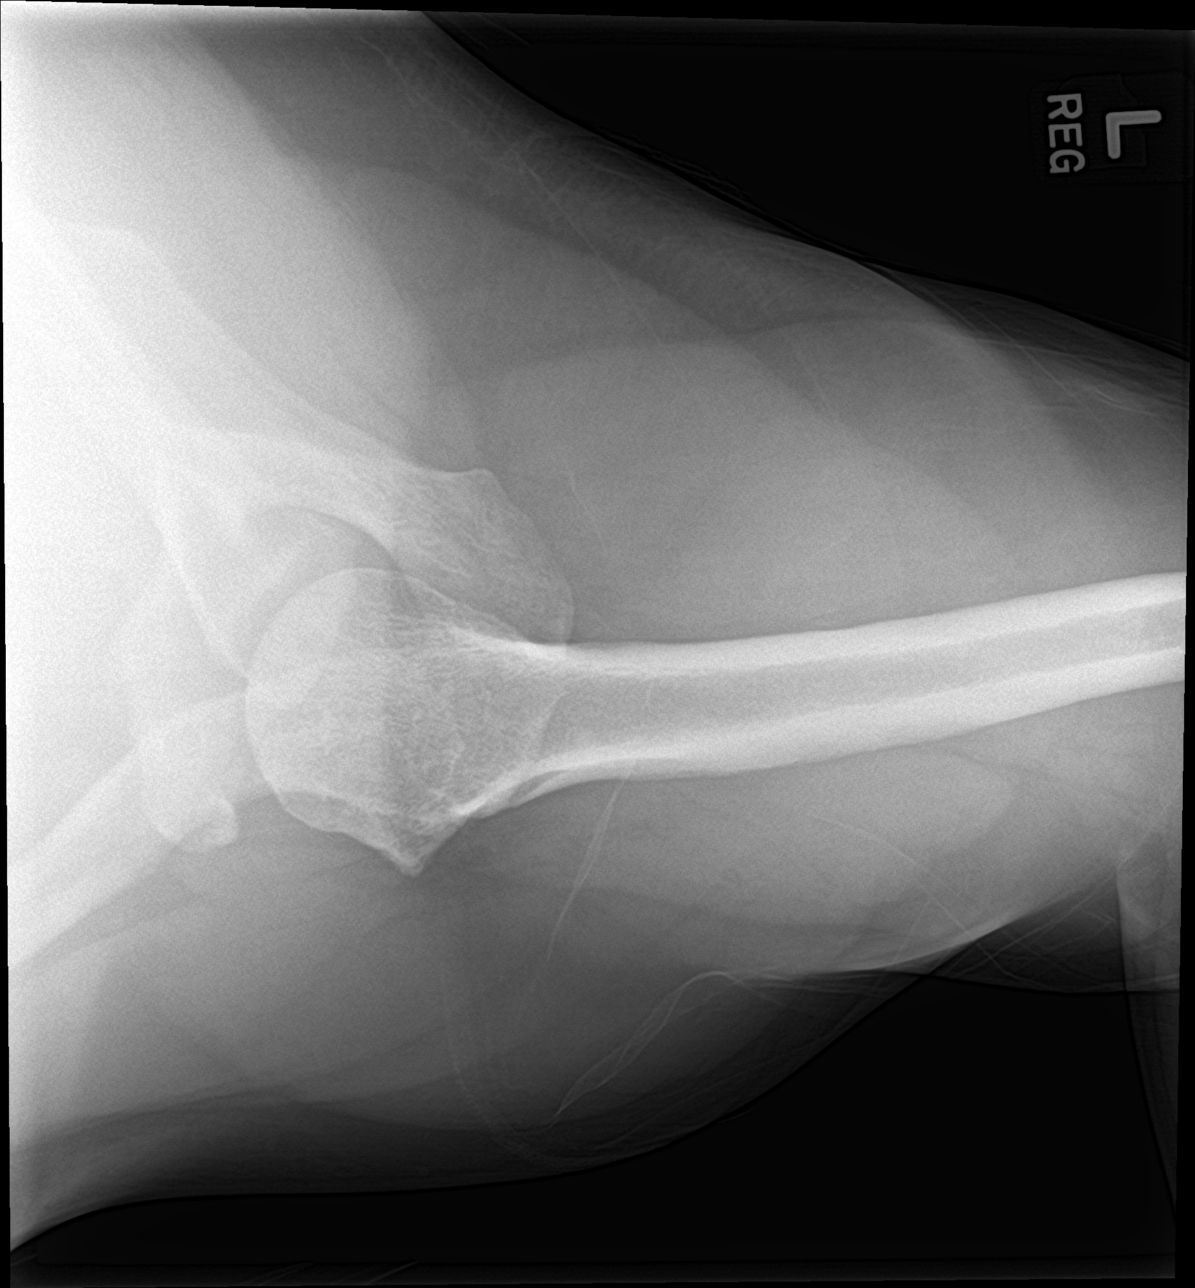

[3 of 3 positions shown; findings below may reference images not displayed]

FINDINGS: There is no evidence of fracture or dislocation. There is no
evidence of arthropathy or other focal bone abnormality. Soft
tissues are unremarkable.
IMPRESSION: Negative.

## 2020-04-13 ENCOUNTER — Other Ambulatory Visit: Payer: Self-pay | Admitting: Physician Assistant

## 2020-04-13 DIAGNOSIS — F5101 Primary insomnia: Secondary | ICD-10-CM

## 2020-04-25 ENCOUNTER — Other Ambulatory Visit: Payer: Self-pay | Admitting: Physician Assistant

## 2020-04-25 DIAGNOSIS — F329 Major depressive disorder, single episode, unspecified: Secondary | ICD-10-CM

## 2020-04-25 DIAGNOSIS — E1159 Type 2 diabetes mellitus with other circulatory complications: Secondary | ICD-10-CM

## 2020-04-25 DIAGNOSIS — I152 Hypertension secondary to endocrine disorders: Secondary | ICD-10-CM

## 2020-04-29 ENCOUNTER — Other Ambulatory Visit: Payer: Self-pay | Admitting: Physician Assistant

## 2020-04-29 DIAGNOSIS — M62830 Muscle spasm of back: Secondary | ICD-10-CM

## 2020-05-19 ENCOUNTER — Other Ambulatory Visit: Payer: Self-pay | Admitting: Physician Assistant

## 2020-05-19 DIAGNOSIS — E1159 Type 2 diabetes mellitus with other circulatory complications: Secondary | ICD-10-CM

## 2020-05-19 DIAGNOSIS — I152 Hypertension secondary to endocrine disorders: Secondary | ICD-10-CM

## 2020-05-24 ENCOUNTER — Other Ambulatory Visit: Payer: Self-pay | Admitting: Physician Assistant

## 2020-06-15 ENCOUNTER — Other Ambulatory Visit: Payer: Self-pay | Admitting: Physician Assistant

## 2020-06-27 ENCOUNTER — Telehealth: Payer: Self-pay

## 2020-06-27 DIAGNOSIS — I152 Hypertension secondary to endocrine disorders: Secondary | ICD-10-CM

## 2020-06-27 DIAGNOSIS — E1159 Type 2 diabetes mellitus with other circulatory complications: Secondary | ICD-10-CM

## 2020-06-27 MED ORDER — VERAPAMIL HCL 120 MG PO TABS: 120.0000 mg | ORAL_TABLET | Freq: Three times a day (TID) | ORAL | 1 refills | Status: DC

## 2020-06-27 NOTE — Telephone Encounter (Signed)
MEDICATION: verapamil (CALAN) 120 MG tablet  PHARMACY:  [id number] [place], [place] - [address] Phone:  [phone]  Fax:  [phone]       Comments: Patient states he needs 90 days worth instead of 30 since her takes it 3 times a day. Please advise.      **Let patient know to contact pharmacy at the end of the day to make sure medication is ready. **  ** Please notify patient to allow 48-72 hours to process**  **Encourage patient to contact the pharmacy for refills or they can request refills through [facility]**

## 2020-06-27 NOTE — Telephone Encounter (Signed)
Rx sent to pharmacy as requested.

## 2020-07-15 ENCOUNTER — Other Ambulatory Visit: Payer: Self-pay | Admitting: Physician Assistant

## 2020-08-14 ENCOUNTER — Other Ambulatory Visit: Payer: Self-pay | Admitting: Physician Assistant

## 2020-08-15 NOTE — Telephone Encounter (Signed)
LOV: 04/01/2020 12/30/2020 Last refill: 06/16/2020  Approve?

## 2020-08-17 ENCOUNTER — Other Ambulatory Visit: Payer: Self-pay | Admitting: Family Medicine

## 2020-09-12 ENCOUNTER — Other Ambulatory Visit: Payer: Self-pay | Admitting: Physician Assistant

## 2020-09-17 ENCOUNTER — Other Ambulatory Visit: Payer: Self-pay | Admitting: Physician Assistant

## 2020-09-18 ENCOUNTER — Other Ambulatory Visit: Payer: Self-pay | Admitting: Family Medicine

## 2020-10-09 ENCOUNTER — Other Ambulatory Visit: Payer: Self-pay | Admitting: Family Medicine

## 2020-10-19 ENCOUNTER — Other Ambulatory Visit: Payer: Self-pay | Admitting: Family Medicine

## 2020-12-11 ENCOUNTER — Other Ambulatory Visit: Payer: Self-pay | Admitting: Physician Assistant

## 2020-12-11 DIAGNOSIS — I152 Hypertension secondary to endocrine disorders: Secondary | ICD-10-CM

## 2020-12-11 DIAGNOSIS — E1159 Type 2 diabetes mellitus with other circulatory complications: Secondary | ICD-10-CM

## 2020-12-29 NOTE — Progress Notes (Signed)
Robert Gamble is a 45 y.o. male here for a follow up of diabetes and hypertension.   History of Present Illness:   Chief Complaint  Patient presents with   Diabetes   Hypertension    HPI  Diabetes Pt here for 9 month follow-up. Currently not on any DM medication, instead is making healthy lifestyle changes. Robert Gamble reports he has been attending the gym and trying to make healthier lifestyle choice. Currently he is down 13 pounds from our prior visit, but feels he could be doing more in terms of dieting.   As of now he is open to trialing a weight loss aid such as mounjaro. Previously trialed ozempic but experienced GI issues while on this medication. Denies: hypoglycemic or hyperglycemic episodes or symptoms. This patient's diabetes is complicated by HTN.    Lab Results  Component Value Date   HGBA1C 6.4 04/01/2020   HTN Currently compliant with taking verapamil 120 mg TID, Metoprolol XL 25 mg , Losartan 100 mg, and HCTZ 25 mg daily with no adverse effects. Patient denies chest pain, SOB, blurred vision, dizziness, unusual headaches, lower leg swelling. Denies excessive caffeine intake, stimulant usage, excessive alcohol intake, or increase in salt consumption. BP Readings from Last 3 Encounters:  12/30/20 140/80  04/01/20 126/70  01/04/20 (!) 145/78    Depression Currently compliant with Wellbutrin XL 150 mg with no adverse effects. He is managing well. No SI/HI.    Past Medical History:  Diagnosis Date   Allergic rhinitis    Chronic headache    DM2 (diabetes mellitus, type 2) ([facility])    HTN (hypertension)    OSA on CPAP      Social History   Tobacco Use   Smoking status: Former   Smokeless tobacco: Never  Scientific laboratory technician Use: Never used  Substance Use Topics   Alcohol use: No   Drug use: No    Past Surgical History:  Procedure Laterality Date   APPENDECTOMY      Family History  Problem Relation Age of Onset   Hypertension Father    Sleep  apnea Sister    Diabetes Sister    Hyperlipidemia Sister    Hypertension Sister    Mental illness Sister    Lung cancer Other    Clotting disorder Other    Emphysema Other    Heart disease Other    Cancer Maternal Grandmother    Cancer Maternal Grandfather     Allergies  Allergen Reactions   Pregabalin Shortness Of Breath   Phentermine Palpitations   Ramipril Cough   Statins     Myalgia    Zocor [Simvastatin]    Penicillins Rash    Current Medications:   Current Outpatient Medications:    buPROPion (WELLBUTRIN XL) 150 MG 24 hr tablet, TAKE 1 TABLET BY MOUTH EVERY DAY, Disp: 90 tablet, Rfl: 1   hydrochlorothiazide (HYDRODIURIL) 25 MG tablet, TAKE 1 TABLET (25 MG TOTAL) BY MOUTH DAILY., Disp: 30 tablet, Rfl: 5   loratadine (CLARITIN) 10 MG tablet, Take 10 mg by mouth daily., Disp: , Rfl:    losartan (COZAAR) 100 MG tablet, TAKE 1 TABLET BY MOUTH EVERY DAY, Disp: 30 tablet, Rfl: 5   meloxicam (MOBIC) 15 MG tablet, TAKE 1 TABLET (15 MG TOTAL) BY MOUTH DAILY. (Patient taking differently: Take 15 mg by mouth daily as needed.), Disp: 30 tablet, Rfl: 0   methocarbamol (ROBAXIN) 500 MG tablet, TAKE 1 TABLET BY MOUTH TWICE A DAY (Patient taking  differently: Take 500 mg by mouth 2 (two) times daily as needed.), Disp: 60 tablet, Rfl: 1   metoprolol succinate (TOPROL-XL) 25 MG 24 hr tablet, TAKE 1 TABLET BY MOUTH EVERY DAY, Disp: 30 tablet, Rfl: 5   Multiple Vitamin (MULTIVITAMIN) capsule, Take 1 capsule by mouth daily., Disp: , Rfl:    SUMAtriptan (IMITREX) 100 MG tablet, TAKE 1 TAB AS NEEDWED FOR MIGRAINE, REPEAT IN 2 HRS IF PERSISTS OR RECURS, Disp: 9 tablet, Rfl: 0   verapamil (CALAN) 120 MG tablet, TAKE 1 TABLET (120 MG TOTAL) BY MOUTH 3 (THREE) TIMES DAILY., Disp: 30 tablet, Rfl: 5   Review of Systems:   ROS Negative unless otherwise specified per HPI.  Vitals:   Vitals:   12/30/20 0807  BP: 140/80  Pulse: 66  Temp: 98.1 F (36.7 C)  TempSrc: Temporal  SpO2: 97%  Weight:  268 lb (121.6 kg)  Height: 5\' 9"  (1.753 m)     Body mass index is 39.58 kg/m.  Physical Exam:   Physical Exam Vitals and nursing note reviewed.  Constitutional:      General: He is not in acute distress.    Appearance: He is well-developed. He is not ill-appearing or toxic-appearing.  Cardiovascular:     Rate and Rhythm: Normal rate and regular rhythm.     Pulses: Normal pulses.     Heart sounds: Normal heart sounds, S1 normal and S2 normal.  Pulmonary:     Effort: Pulmonary effort is normal.     Breath sounds: Normal breath sounds.  Skin:    General: Skin is warm and dry.  Neurological:     Mental Status: He is alert.     GCS: GCS eye subscore is 4. GCS verbal subscore is 5. GCS motor subscore is 6.  Psychiatric:        Speech: Speech normal.        Behavior: Behavior normal. Behavior is cooperative.   Diabetic Foot Exam - Simple   Simple Foot Form Diabetic Foot exam was performed with the following findings: Yes 12/30/2020  8:10 AM  Visual Inspection No deformities, no ulcerations, no other skin breakdown bilaterally: Yes Sensation Testing Intact to touch and monofilament testing bilaterally: Yes Pulse Check Posterior Tibialis and Dorsalis pulse intact bilaterally: Yes Comments     Assessment and Plan:   Hypertension associated with diabetes ([place]), on Losartan, HCTZ, Verapamil Normotensive Continue verapamil 120 mg TID, Metoprolol XL 25 mg , Losartan 100 mg, and HCTZ 25 mg daily -- follow-up in 3-6 months, sooner if concerns.  Type 2 diabetes mellitus without complication, on Metformin Update A1c and provide recommendations, however we are going to start mounjaro 2.5 mg weekly for weight loss. I have asked patient to let us know if he would like to continue [facility] after 2-3 weeks.  Reactive depression Stable Continue Wellbutrin 150 mg XL daily   I,Havlyn C Ratchford,acting as a scribe for [employer], PA.,have documented all relevant documentation on the  behalf of Inda Coke, PA,as directed by  Inda Coke, PA while in the presence of Inda Coke, [place].   I, Inda Coke, [place], have reviewed all documentation for this visit. The documentation on 12/30/20 for the exam, diagnosis, procedures, and orders are all accurate and complete.   Inda Coke, PA-C

## 2020-12-30 ENCOUNTER — Other Ambulatory Visit: Payer: Self-pay | Admitting: Physician Assistant

## 2020-12-30 ENCOUNTER — Encounter: Payer: Self-pay | Admitting: Physician Assistant

## 2020-12-30 ENCOUNTER — Ambulatory Visit (INDEPENDENT_AMBULATORY_CARE_PROVIDER_SITE_OTHER): Payer: BC Managed Care – PPO | Admitting: Physician Assistant

## 2020-12-30 ENCOUNTER — Other Ambulatory Visit: Payer: Self-pay

## 2020-12-30 VITALS — BP 140/80 | HR 66 | Temp 98.1°F | Ht 69.0 in | Wt 268.0 lb

## 2020-12-30 DIAGNOSIS — I10 Essential (primary) hypertension: Secondary | ICD-10-CM

## 2020-12-30 DIAGNOSIS — E119 Type 2 diabetes mellitus without complications: Secondary | ICD-10-CM

## 2020-12-30 DIAGNOSIS — E1159 Type 2 diabetes mellitus with other circulatory complications: Secondary | ICD-10-CM | POA: Diagnosis not present

## 2020-12-30 DIAGNOSIS — F329 Major depressive disorder, single episode, unspecified: Secondary | ICD-10-CM

## 2020-12-30 DIAGNOSIS — I152 Hypertension secondary to endocrine disorders: Secondary | ICD-10-CM | POA: Diagnosis not present

## 2020-12-30 LAB — CBC WITH DIFFERENTIAL/PLATELET
Basophils Absolute: 0.1 10*3/uL (ref 0.0–0.1)
Basophils Relative: 1.1 % (ref 0.0–3.0)
Eosinophils Absolute: 0.5 10*3/uL (ref 0.0–0.7)
Eosinophils Relative: 7.2 % — ABNORMAL HIGH (ref 0.0–5.0)
HCT: 41.8 % (ref 39.0–52.0)
Hemoglobin: 14.4 g/dL (ref 13.0–17.0)
Lymphocytes Relative: 23.3 % (ref 12.0–46.0)
Lymphs Abs: 1.7 10*3/uL (ref 0.7–4.0)
MCHC: 34.5 g/dL (ref 30.0–36.0)
MCV: 84.6 fl (ref 78.0–100.0)
Monocytes Absolute: 0.7 10*3/uL (ref 0.1–1.0)
Monocytes Relative: 9.4 % (ref 3.0–12.0)
Neutro Abs: 4.3 10*3/uL (ref 1.4–7.7)
Neutrophils Relative %: 59 % (ref 43.0–77.0)
Platelets: 297 10*3/uL (ref 150.0–400.0)
RBC: 4.94 Mil/uL (ref 4.22–5.81)
RDW: 13.4 % (ref 11.5–15.5)
WBC: 7.3 10*3/uL (ref 4.0–10.5)

## 2020-12-30 LAB — COMPREHENSIVE METABOLIC PANEL
ALT: 37 U/L (ref 0–53)
AST: 29 U/L (ref 0–37)
Albumin: 4.5 g/dL (ref 3.5–5.2)
Alkaline Phosphatase: 63 U/L (ref 39–117)
BUN: 14 mg/dL (ref 6–23)
CO2: 30 mEq/L (ref 19–32)
Calcium: 9.2 mg/dL (ref 8.4–10.5)
Chloride: 102 mEq/L (ref 96–112)
Creatinine, Ser: 0.83 mg/dL (ref 0.40–1.50)
GFR: 106.53 mL/min (ref >60.00)
Glucose, Bld: 119 mg/dL — ABNORMAL HIGH (ref 70–99)
Potassium: 3.1 mEq/L — ABNORMAL LOW (ref 3.5–5.1)
Sodium: 141 mEq/L (ref 135–145)
Total Bilirubin: 0.5 mg/dL (ref 0.2–1.2)
Total Protein: 7.8 g/dL (ref 6.0–8.3)

## 2020-12-30 LAB — HEMOGLOBIN A1C: Hgb A1c MFr Bld: 6.6 % — ABNORMAL HIGH (ref 4.6–6.5)

## 2020-12-30 MED ORDER — TIRZEPATIDE 2.5 MG/0.5ML ~~LOC~~ SOAJ: 2.5000 mg | SUBCUTANEOUS | 0 refills | Status: DC

## 2020-12-30 MED ORDER — VERAPAMIL HCL 120 MG PO TABS
120.0000 mg | ORAL_TABLET | Freq: Three times a day (TID) | ORAL | 5 refills | Status: DC
Start: 2020-12-30 — End: 2021-01-01

## 2020-12-30 NOTE — Patient Instructions (Addendum)
It was great to see you!  Continue current blood pressure regimen  Continue wellbutrin  Trial 2.5 mg weekly Mounjaro  Please let me know if you would like to continue Mounjaro after 2-3 weeks.  I will be in touch with your blood work results.  Take care,  Inda Coke PA-C

## 2020-12-31 ENCOUNTER — Encounter: Payer: Self-pay | Admitting: Physician Assistant

## 2021-01-01 ENCOUNTER — Other Ambulatory Visit: Payer: Self-pay | Admitting: Physician Assistant

## 2021-01-01 DIAGNOSIS — I152 Hypertension secondary to endocrine disorders: Secondary | ICD-10-CM

## 2021-01-01 DIAGNOSIS — E1159 Type 2 diabetes mellitus with other circulatory complications: Secondary | ICD-10-CM

## 2021-01-01 DIAGNOSIS — E876 Hypokalemia: Secondary | ICD-10-CM

## 2021-01-01 MED ORDER — VERAPAMIL HCL 120 MG PO TABS: 120.0000 mg | ORAL_TABLET | Freq: Three times a day (TID) | ORAL | 5 refills | Status: DC

## 2021-01-01 MED ORDER — METFORMIN HCL ER 750 MG PO TB24: 750.0000 mg | ORAL_TABLET | Freq: Every day | ORAL | 1 refills | Status: DC

## 2021-01-01 MED ORDER — POTASSIUM CHLORIDE CRYS ER 10 MEQ PO TBCR: 10.0000 meq | EXTENDED_RELEASE_TABLET | Freq: Every day | ORAL | 0 refills | Status: DC

## 2021-01-04 ENCOUNTER — Other Ambulatory Visit: Payer: Self-pay | Admitting: Physician Assistant

## 2021-01-04 MED ORDER — DOXYCYCLINE HYCLATE 100 MG PO TABS: 100.0000 mg | ORAL_TABLET | Freq: Two times a day (BID) | ORAL | 0 refills | Status: DC

## 2021-01-05 NOTE — Progress Notes (Signed)
PATIENT: Robert Gamble DOB: [birth year]  REASON FOR VISIT: follow up HISTORY FROM: patient  No chief complaint on file.    HISTORY OF PRESENT ILLNESS: 01/09/21 ALL: Robert Gamble returns for follow up for OSA on CPAP. He received new Phillips CPAP machine in 07/2020. Gets supplies from [employer]. He is doing well. Fatigue has improved.     01/04/2020 ALL:  Robert Gamble is a 45 y.o. male here today for follow up for OSA on CPAP. He continues to do well with CPAP therapy. He does note worsening fatigue over the past 8-10 weeks. He endorses more stress at work. He is followed closely by PCP. He feels mood is stable. He is sleeping well. No medication changes.   Compliance report dated 10/02/2019 through 12/30/2019 reveals that he used CPAP therapy 89 of the past 90 days for compliance of 89%.  He is CPAP greater than 4 hours 86 of the past 90 days for compliance of 95.6%.  Average usage was 7 hours and 22 minutes.  Residual AHI was 2.1 on 7 to 14 cm of water.  There was no significant leak noted.   HISTORY: (copied from my note on 01/01/2019)  Robert Gamble is a 45 y.o. male here today for follow up for mild OSA with prolonged hypoxia started on on CPAP recently.  He reports doing very well with CPAP therapy at home.  He is using a nasal pillow and has adjusted nicely.  Compliance report dated 12/01/2018 through 12/30/2018 reveals that he is using CPAP therapy every night for compliance of 100% every night he used CPAP greater than 4 hours for compliance of 100%.  Average usage was 7 hours and 23 minutes.  AHI was 3.3 on 7 to 14 cm of water and an EPR of 2.  There was no significant leak.   HISTORY: (copied from Dr Dohmeier's note on 07/29/2018)   HPI:  Robert Gamble is a 45 y.o. male  Is seen here in a video guided telemedicine consultation after a referral  from Dr. Juleen China for possible sleep apnea.   Robert Gamble is a 45 year old Caucasian gentleman whose current  chief complaint is a.  Of 6 months during which he gets on average only 3 to 4 hours of nocturnal sleep.  This all started before the coronavirus lockdown.  He reports that he had a sleep study 7 years ago at [facility] was diagnosed with obstructive sleep apnea and prescribed CPAP.  Within the next 24 months he lost 100 pounds and felt or was told that he does not need CPAP anymore.  Unfortunately he has gained his weight back he reports a current weight of 272 pounds at a height of 5 foot 9 inches.  His wife has started to hear him snore loudly again, and he struggles with daytime sleepiness and fatigue.    I reviewed all the patient's medications as provided on Dr. Lutricia Horsfall referral. The patient has a history of morbid obesity, hypertension associated with diabetes type 2, treated on 3 medications, losartan, hydrochlorothiazide, verapamil. has acute knee pain but also reports that this does not bother him as much at night only when he actually walks. He suffers from migraines and has been taking Naprosyn and Imitrex.  Imitrex of course as needed.   He endorsed the PHQ 9 score for depression in February 2020 at 11 points and now on the new medication on 15 Jul 2018 at only 4 points for this seems  to have been an significant improvement.   He has trouble with insomnia not initiating sleep but maintaining sleep, and has been diagnosed with anxiety he takes Cymbalta and Lexapro.  He has apparently been taking trazodone-0 50 mg tablets and tried a house by mouth at bedtime as needed for sleep he is also been on verapamil .  He has been taking baclofen for spasm.  Wellbutrin used as a daytime booster.   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  He drinks 1 coffee in the morning and iced tea at dinner, alcohol is consumed very seldomly.  He does not endorse any form of tobacco use and is a lifelong non-smoker.    Sleep habits: The patient describes that family dinner is between 6 and 7:30 PM, bedtime is usually around 10 PM.  He has no trouble falling asleep when he goes to bed the bedroom is cool but not quiet and dark for the first hour as his wife insists on watching TV.  The TV however is on a timer and does not accompany the couple throughout the night.  Habitually he sleeps or falls asleep in supine position but he wakes up on his side.  He sleeps with 1-2 pillows, and he usually stays asleep for almost 4 hours before he wakes up sometimes it is nocturia that will wake him up but sometimes it is spontaneous and he is not aware of the trigger.  He denies having any vivid dreams or nightmares, and while he has some problems with knee pain it does not seem to affect him at night, does not create PLM's or restless legs.  However he has trouble to resume sleep again.  When he rises in the morning around 6 he feels unrefreshed as if he needs more sleep.  He avoids naps- he is basically physically active all day which helps him to overcome sleepiness, but as soon as he returns home after work and sits down to rest he can fall asleep in no time. TST is 3-4 hours only for 6 month .    REVIEW OF SYSTEMS: Out of a complete 14 system review of symptoms, the patient complains only of the following symptoms, fatigue and all other reviewed systems are negative.  ESS: 7   ALLERGIES: Allergies  Allergen Reactions   Pregabalin Shortness Of Breath   Phentermine Palpitations   Ramipril Cough   Statins     Myalgia    Zocor [Simvastatin]    Penicillins Rash    HOME MEDICATIONS: Outpatient Medications Prior to Visit  Medication Sig Dispense Refill   buPROPion (WELLBUTRIN XL) 150 MG 24 hr tablet TAKE 1 TABLET BY MOUTH EVERY DAY 90 tablet 1   doxycycline (VIBRA-TABS) 100 MG tablet Take 1 tablet (100 mg total) by mouth 2 (two) times daily. 10 tablet 0   hydrochlorothiazide (HYDRODIURIL) 25 MG tablet TAKE 1 TABLET (25  MG TOTAL) BY MOUTH DAILY. 30 tablet 5   loratadine (CLARITIN) 10 MG tablet Take 10 mg by mouth daily.     losartan (COZAAR) 100 MG tablet TAKE 1 TABLET BY MOUTH EVERY DAY 30 tablet 5   meloxicam (MOBIC) 15 MG tablet TAKE 1 TABLET (15 MG TOTAL) BY MOUTH DAILY. (Patient taking differently: Take 15 mg by mouth daily as needed.) 30 tablet 0   metFORMIN (GLUCOPHAGE XR) 750 MG 24 hr tablet Take 1 tablet (750 mg total) by mouth daily  with breakfast. 90 tablet 1   methocarbamol (ROBAXIN) 500 MG tablet TAKE 1 TABLET BY MOUTH TWICE A DAY (Patient taking differently: Take 500 mg by mouth 2 (two) times daily as needed.) 60 tablet 1   metoprolol succinate (TOPROL-XL) 25 MG 24 hr tablet TAKE 1 TABLET BY MOUTH EVERY DAY 30 tablet 5   Multiple Vitamin (MULTIVITAMIN) capsule Take 1 capsule by mouth daily.     potassium chloride (KLOR-CON) 10 MEQ tablet Take 1 tablet (10 mEq total) by mouth daily. 30 tablet 0   SUMAtriptan (IMITREX) 100 MG tablet TAKE 1 TAB AS NEEDWED FOR MIGRAINE, REPEAT IN 2 HRS IF PERSISTS OR RECURS 9 tablet 0   verapamil (CALAN) 120 MG tablet Take 1 tablet (120 mg total) by mouth 3 (three) times daily. 90 tablet 5   No facility-administered medications prior to visit.    PAST MEDICAL HISTORY: Past Medical History:  Diagnosis Date   Allergic rhinitis    Chronic headache    DM2 (diabetes mellitus, type 2) (HCC)    HTN (hypertension)    OSA on CPAP     PAST SURGICAL HISTORY: Past Surgical History:  Procedure Laterality Date   APPENDECTOMY      FAMILY HISTORY: Family History  Problem Relation Age of Onset   Hypertension Father    Sleep apnea Sister    Diabetes Sister    Hyperlipidemia Sister    Hypertension Sister    Mental illness Sister    Lung cancer Other    Clotting disorder Other    Emphysema Other    Heart disease Other    Cancer Maternal Grandmother    Cancer Maternal Grandfather     SOCIAL HISTORY: Social History   Socioeconomic History   Marital status:  Married    Spouse name: Not on file   Number of children: Not on file   Years of education: Not on file   Highest education level: Not on file  Occupational History   Not on file  Tobacco Use   Smoking status: Former   Smokeless tobacco: Never  Vaping Use   Vaping Use: Never used  Substance and Sexual Activity   Alcohol use: No   Drug use: No   Sexual activity: Yes    Birth control/protection: Rhythm  Other Topics Concern   Not on file  Social History Narrative   Quit ETOH, Street drugs   Married, daughter   Patient is a current smoker, 1/2ppd   Quit ETOH, Street drugs         Social Determinants of Radio broadcast assistant Strain: Not on file  Food Insecurity: Not on file  Transportation Needs: Not on file  Physical Activity: Not on file  Stress: Not on file  Social Connections: Not on file  Intimate Partner Violence: Not on file     PHYSICAL EXAM  There were no vitals filed for this visit.  There is no height or weight on file to calculate BMI.  Generalized: Well developed, in no acute distress  Cardiology: normal rate and rhythm, no murmur noted Respiratory: clear to auscultation bilaterally  Neurological examination  Mentation: Alert oriented to time, place, history taking. Follows all commands speech and language fluent Cranial nerve II-XII: Pupils were equal round reactive to light. Extraocular movements were full, visual field were full  Motor: The motor testing reveals 5 over 5 strength of all 4 extremities. Good symmetric motor tone is noted throughout.  Gait and station: Gait is normal.  DIAGNOSTIC DATA (LABS, IMAGING, TESTING) - I reviewed patient records, labs, notes, testing and imaging myself where available.  No flowsheet data found.   Lab Results  Component Value Date   WBC 7.3 12/30/2020   HGB 14.4 12/30/2020   HCT 41.8 12/30/2020   MCV 84.6 12/30/2020   PLT 297.0 12/30/2020      Component Value Date/Time   NA 141 12/30/2020  0839   K 3.1 (L) 12/30/2020 0839   CL 102 12/30/2020 0839   CO2 30 12/30/2020 0839   GLUCOSE 119 (H) 12/30/2020 0839   BUN 14 12/30/2020 0839   CREATININE 0.83 12/30/2020 0839   CALCIUM 9.2 12/30/2020 0839   PROT 7.8 12/30/2020 0839   ALBUMIN 4.5 12/30/2020 0839   AST 29 12/30/2020 0839   ALT 37 12/30/2020 0839   ALKPHOS 63 12/30/2020 0839   BILITOT 0.5 12/30/2020 0839   GFRNONAA >60 06/06/2018 2033   GFRAA >60 06/06/2018 2033   Lab Results  Component Value Date   CHOL 169 04/01/2020   HDL 36.40 (L) 04/01/2020   LDLCALC 97 04/01/2020   LDLDIRECT 133.0 02/06/2019   TRIG 175.0 (H) 04/01/2020   CHOLHDL 5 04/01/2020   Lab Results  Component Value Date   HGBA1C 6.6 (H) 12/30/2020   Lab Results  Component Value Date   VITAMINB12 606 02/11/2017   Lab Results  Component Value Date   TSH 1.60 07/03/2016     ASSESSMENT AND PLAN 45 y.o. year old male  has a past medical history of Allergic rhinitis, Chronic headache, DM2 (diabetes mellitus, type 2) ([place]), HTN (hypertension), and OSA on CPAP. here with     ICD-10-CM   1. OSA on CPAP  G47.33    Z99.89         Jamier Urbas is doing well on CPAP therapy.  Compliance report reveals excellent compliance.  He was encouraged to continue using CPAP nightly and for greater than 4 hours each night. We will update supply orders as indicated. Risks of untreated sleep apnea review and education materials provided. Healthy lifestyle habits encouraged. He will follow up in 1 year, sooner if needed. He verbalizes understanding and agreement with this plan.    No orders of the defined types were placed in this encounter.    No orders of the defined types were placed in this encounter.     Debbora Presto, FNP-C 01/09/2021, 7:33 AM [facility] [address] [place], [place] [address] [phone]

## 2021-01-05 NOTE — Patient Instructions (Signed)

## 2021-01-09 ENCOUNTER — Encounter: Payer: Self-pay | Admitting: Family Medicine

## 2021-01-09 ENCOUNTER — Other Ambulatory Visit: Payer: Self-pay

## 2021-01-09 ENCOUNTER — Ambulatory Visit (INDEPENDENT_AMBULATORY_CARE_PROVIDER_SITE_OTHER): Payer: BC Managed Care – PPO | Admitting: Family Medicine

## 2021-01-09 VITALS — BP 153/94 | HR 70 | Ht 69.0 in | Wt 269.0 lb

## 2021-01-09 DIAGNOSIS — G4733 Obstructive sleep apnea (adult) (pediatric): Secondary | ICD-10-CM | POA: Diagnosis not present

## 2021-01-09 DIAGNOSIS — Z9989 Dependence on other enabling machines and devices: Secondary | ICD-10-CM | POA: Diagnosis not present

## 2021-01-17 ENCOUNTER — Other Ambulatory Visit: Payer: Self-pay | Admitting: Physician Assistant

## 2021-01-17 ENCOUNTER — Ambulatory Visit (INDEPENDENT_AMBULATORY_CARE_PROVIDER_SITE_OTHER): Payer: BC Managed Care – PPO | Admitting: Family Medicine

## 2021-01-17 ENCOUNTER — Encounter: Payer: Self-pay | Admitting: Family Medicine

## 2021-01-17 ENCOUNTER — Other Ambulatory Visit: Payer: Self-pay

## 2021-01-17 VITALS — BP 156/79 | HR 60 | Temp 98.3°F | Ht 69.0 in | Wt 270.4 lb

## 2021-01-17 DIAGNOSIS — I10 Essential (primary) hypertension: Secondary | ICD-10-CM | POA: Diagnosis not present

## 2021-01-17 DIAGNOSIS — E876 Hypokalemia: Secondary | ICD-10-CM | POA: Diagnosis not present

## 2021-01-17 LAB — BASIC METABOLIC PANEL
BUN: 10 mg/dL (ref 6–23)
CO2: 29 mEq/L (ref 19–32)
Calcium: 9.6 mg/dL (ref 8.4–10.5)
Chloride: 101 mEq/L (ref 96–112)
Creatinine, Ser: 0.8 mg/dL (ref 0.40–1.50)
GFR: 107.68 mL/min (ref >60.00)
Glucose, Bld: 120 mg/dL — ABNORMAL HIGH (ref 70–99)
Potassium: 3.3 mEq/L — ABNORMAL LOW (ref 3.5–5.1)
Sodium: 139 mEq/L (ref 135–145)

## 2021-01-17 MED ORDER — POTASSIUM CHLORIDE CRYS ER 10 MEQ PO TBCR: 10.0000 meq | EXTENDED_RELEASE_TABLET | Freq: Every day | ORAL | 1 refills | Status: DC

## 2021-01-17 MED ORDER — DOXYCYCLINE HYCLATE 100 MG PO TABS: 100.0000 mg | ORAL_TABLET | Freq: Two times a day (BID) | ORAL | 0 refills | Status: DC

## 2021-01-17 MED ORDER — MUPIROCIN CALCIUM 2 % EX CREA: 1.0000 "application " | TOPICAL_CREAM | Freq: Two times a day (BID) | CUTANEOUS | 0 refills | Status: DC

## 2021-01-17 NOTE — Patient Instructions (Signed)
It was very nice to see you today!  You have an infection on your fingernails.  This is called a paronychia.  please start the doxycycline and mupirocin.  Let us know if your symptoms are not improving over the next few days.  We will check her blood work today.  Take care, Dr Jerline Pain  PLEASE NOTE:  If you had any lab tests please let us know if you have not heard back within a few days. You may see your results on mychart before we have a chance to review them but we will give you a call once they are reviewed by [place]. If we ordered any referrals today, please let us know if you have not heard from their office within the next week.   Please try these tips to maintain a healthy lifestyle:  Eat at least 3 REAL meals and 1-2 snacks per day.  Aim for no more than 5 hours between eating.  If you eat breakfast, please do so within one hour of getting up.   Each meal should contain half fruits/vegetables, one quarter protein, and one quarter carbs (no bigger than a computer mouse)  Cut down on sweet beverages. This includes juice, soda, and sweet tea.   Drink at least 1 glass of water with each meal and aim for at least 8 glasses per day  Exercise at least 150 minutes every week.

## 2021-01-17 NOTE — Progress Notes (Signed)
   Robert Gamble is a 45 y.o. male who presents today for an office visit.  Assessment/Plan:  New/Acute Problems: Paronychia No red flags.  No sites amenable to I&D today.  We will extend course of doxycycline.  Also start topical mupirocin.  He will let me know if not improving.  Hypokalemia Likely due to side effect of HCTZ.  He is on potassium supplementation.  We will recheck BMET today.  Chronic Problems Addressed Today: Essential hypertension Not controlled today though dealing with acute illness.  We will continue current regimen verapamil 120 mg 3 times daily, metoprolol succinate 25 mg daily, losartan 100 mg daily, and HCTZ 25 mg daily.  His hypokalemia is likely due to the HCTZ.  May consider switching to alternative thought will defer to his PCP.     Subjective:  HPI:  He is here with infection on his fingers and wrist. This issue has been ongoing for couple of weeks. Associated symptoms are itching, redness, dryness and pain. Symptom seems to be getting worse. Had swelling in right thumb and redness as well. He and his wife had staph infection before and was treated with doxycycline. His PCP sent in a prescription for doxycycline A couple weeks ago.  He did this for 1 week and his symptoms were getting better but this infection developed again after stopping his antibiotic. This got worse couple of days ago. Denies redness around eyes.        Objective:  Physical Exam: BP (!) 156/79   Pulse 60   Temp 98.3 F (36.8 C) (Temporal)   Ht 5\' 9"  (1.753 m)   Wt 270 lb 6.4 oz (122.7 kg)   SpO2 98%   BMI 39.93 kg/m   Gen: No acute distress, resting comfortably Skin: Redness, edema, and erythema involving nail folds on the right first digit and left third digit.  Normal distal cap refill.  Sensation intact. Neuro: Grossly normal, moves all extremities Psych: Normal affect and thought content       I,Robert Gamble,acting as a scribe for Dimas Chyle, MD.,have  documented all relevant documentation on the behalf of Dimas Chyle, MD,as directed by  Dimas Chyle, MD while in the presence of Dimas Chyle, MD.   I, Dimas Chyle, MD, have reviewed all documentation for this visit. The documentation on 01/17/21 for the exam, diagnosis, procedures, and orders are all accurate and complete.  Algis Greenhouse. Jerline Pain, MD 01/17/2021 9:27 AM

## 2021-01-17 NOTE — Telephone Encounter (Signed)
Patient is scheduled   

## 2021-01-23 ENCOUNTER — Other Ambulatory Visit: Payer: Self-pay | Admitting: Physician Assistant

## 2021-01-23 ENCOUNTER — Other Ambulatory Visit: Payer: Self-pay | Admitting: Family Medicine

## 2021-01-23 MED ORDER — MUPIROCIN 2 % EX OINT
1.0000 | TOPICAL_OINTMENT | Freq: Two times a day (BID) | CUTANEOUS | 0 refills | Status: AC
Start: 2021-01-23 — End: ?

## 2021-01-24 ENCOUNTER — Telehealth: Payer: Self-pay | Admitting: *Deleted

## 2021-01-24 NOTE — Telephone Encounter (Signed)
[facility] - Rx #: [id number] Drug Mupirocin Calcium 2% cream [facility] is covered by current benefit plan. No further PA activity needed

## 2021-01-25 ENCOUNTER — Other Ambulatory Visit: Payer: Self-pay | Admitting: Physician Assistant

## 2021-02-15 ENCOUNTER — Ambulatory Visit: Payer: BC Managed Care – PPO | Admitting: Physician Assistant

## 2021-02-21 ENCOUNTER — Other Ambulatory Visit: Payer: Self-pay | Admitting: Physician Assistant

## 2021-02-22 ENCOUNTER — Other Ambulatory Visit: Payer: Self-pay | Admitting: Family Medicine

## 2021-03-17 ENCOUNTER — Other Ambulatory Visit: Payer: Self-pay | Admitting: Physician Assistant

## 2021-03-21 ENCOUNTER — Other Ambulatory Visit: Payer: Self-pay | Admitting: Family Medicine

## 2021-06-16 ENCOUNTER — Ambulatory Visit (INDEPENDENT_AMBULATORY_CARE_PROVIDER_SITE_OTHER): Payer: BC Managed Care – PPO | Admitting: Physician Assistant

## 2021-06-16 ENCOUNTER — Encounter: Payer: Self-pay | Admitting: Physician Assistant

## 2021-06-16 VITALS — BP 140/80 | HR 66 | Temp 98.2°F | Ht 69.0 in | Wt 271.0 lb

## 2021-06-16 DIAGNOSIS — F329 Major depressive disorder, single episode, unspecified: Secondary | ICD-10-CM | POA: Diagnosis not present

## 2021-06-16 DIAGNOSIS — E1169 Type 2 diabetes mellitus with other specified complication: Secondary | ICD-10-CM

## 2021-06-16 DIAGNOSIS — E785 Hyperlipidemia, unspecified: Secondary | ICD-10-CM | POA: Diagnosis not present

## 2021-06-16 DIAGNOSIS — E669 Obesity, unspecified: Secondary | ICD-10-CM | POA: Diagnosis not present

## 2021-06-16 DIAGNOSIS — E119 Type 2 diabetes mellitus without complications: Secondary | ICD-10-CM | POA: Diagnosis not present

## 2021-06-16 DIAGNOSIS — M542 Cervicalgia: Secondary | ICD-10-CM | POA: Diagnosis not present

## 2021-06-16 LAB — COMPREHENSIVE METABOLIC PANEL
ALT: 31 U/L (ref 0–53)
AST: 24 U/L (ref 0–37)
Albumin: 4.2 g/dL (ref 3.5–5.2)
Alkaline Phosphatase: 54 U/L (ref 39–117)
BUN: 13 mg/dL (ref 6–23)
CO2: 28 mEq/L (ref 19–32)
Calcium: 9.2 mg/dL (ref 8.4–10.5)
Chloride: 101 mEq/L (ref 96–112)
Creatinine, Ser: 0.77 mg/dL (ref 0.40–1.50)
GFR: 108.62 mL/min (ref >60.00)
Glucose, Bld: 178 mg/dL — ABNORMAL HIGH (ref 70–99)
Potassium: 3.2 mEq/L — ABNORMAL LOW (ref 3.5–5.1)
Sodium: 140 mEq/L (ref 135–145)
Total Bilirubin: 0.5 mg/dL (ref 0.2–1.2)
Total Protein: 7.4 g/dL (ref 6.0–8.3)

## 2021-06-16 LAB — LIPID PANEL
Cholesterol: 171 mg/dL (ref 0–200)
HDL: 36.4 mg/dL — ABNORMAL LOW (ref >39.00)
NonHDL: 134.86
Total CHOL/HDL Ratio: 5
Triglycerides: 281 mg/dL — ABNORMAL HIGH (ref 0.0–149.0)
VLDL: 56.2 mg/dL — ABNORMAL HIGH (ref 0.0–40.0)

## 2021-06-16 LAB — LDL CHOLESTEROL, DIRECT: Direct LDL: 111 mg/dL

## 2021-06-16 LAB — CBC WITH DIFFERENTIAL/PLATELET
Basophils Absolute: 0.1 10*3/uL (ref 0.0–0.1)
Basophils Relative: 1.1 % (ref 0.0–3.0)
Eosinophils Absolute: 0.5 10*3/uL (ref 0.0–0.7)
Eosinophils Relative: 6.3 % — ABNORMAL HIGH (ref 0.0–5.0)
HCT: 41.5 % (ref 39.0–52.0)
Hemoglobin: 14 g/dL (ref 13.0–17.0)
Lymphocytes Relative: 24.6 % (ref 12.0–46.0)
Lymphs Abs: 1.8 10*3/uL (ref 0.7–4.0)
MCHC: 33.8 g/dL (ref 30.0–36.0)
MCV: 86.6 fl (ref 78.0–100.0)
Monocytes Absolute: 0.6 10*3/uL (ref 0.1–1.0)
Monocytes Relative: 7.9 % (ref 3.0–12.0)
Neutro Abs: 4.5 10*3/uL (ref 1.4–7.7)
Neutrophils Relative %: 60.1 % (ref 43.0–77.0)
Platelets: 284 10*3/uL (ref 150.0–400.0)
RBC: 4.8 Mil/uL (ref 4.22–5.81)
RDW: 14 % (ref 11.5–15.5)
WBC: 7.4 10*3/uL (ref 4.0–10.5)

## 2021-06-16 LAB — HEMOGLOBIN A1C: Hgb A1c MFr Bld: 6.7 % — ABNORMAL HIGH (ref 4.6–6.5)

## 2021-06-16 MED ORDER — BUPROPION HCL ER (XL) 150 MG PO TB24: 150.0000 mg | ORAL_TABLET | Freq: Every day | ORAL | 3 refills | Status: DC

## 2021-06-16 NOTE — Progress Notes (Signed)
? ?Acute Office Visit ? ?Subjective:  ? ?  ?Patient ID: Robert Gamble, male    DOB: 10-May-1975, 46 y.o.   MRN: [id number] ? ?Chief Complaint  ?Patient presents with  ? Obesity  ?  Pt would like to discuss weight loss medications.  ? ? ?HPI ? ?Patient is in today for obesity ? ?Obesity/DM ?He is working out at [employer] 3 x week. Doing dinner order service with portion control. Still having some stress eating. He has trialed Ozempic several years ago but he had upset stomach with it. At the time, we did not hold his metformin. Currently taking metformin 500 mg BID. Tolerating well. ? ? ?Anxiety/Depression ?He held his Wellbutrin 150 mg XL briefly to see if he didn't need it, but realized that he did need it. He wants to restart it. Denies SI/HI. ? ? ?Neck pain ?Starting on Tuesday he had developed neck stiffness. In the morning he has no significant pain, but as the day progresses, he develops pain and stiffness in his neck. He is also getting some numbness and tingling in his R arm. Denies weakness. Has tried tiger balm and flexeril w/o success. ? ? ?HLD ?Currently not on any statins, has intolerance. ? ? ?ROS ?Negative unless otherwise specified per HPI. ? ? ?   ?Objective:  ?  ?BP 140/80 (BP Location: Left Arm, Patient Position: Sitting, Cuff Size: Large)   Pulse 66   Temp 98.2 ?F (36.8 ?C) (Temporal)   Ht '5\' 9"'$  (1.753 m)   Wt 271 lb (122.9 kg)   SpO2 96%   BMI 40.02 kg/m?  ? ? ?Physical Exam ?Vitals and nursing note reviewed.  ?Constitutional:   ?   General: He is not in acute distress. ?   Appearance: He is well-developed. He is not ill-appearing or toxic-appearing.  ?Cardiovascular:  ?   Rate and Rhythm: Normal rate and regular rhythm.  ?   Pulses: Normal pulses.  ?   Heart sounds: Normal heart sounds, S1 normal and S2 normal.  ?Pulmonary:  ?   Effort: Pulmonary effort is normal.  ?   Breath sounds: Normal breath sounds.  ?Musculoskeletal:  ?   Comments: Spasm in back of neck palpated ?No decreased  ROM of b/l arms ?No spinal tenderness  ?Skin: ?   General: Skin is warm and dry.  ?Neurological:  ?   Mental Status: He is alert.  ?   GCS: GCS eye subscore is 4. GCS verbal subscore is 5. GCS motor subscore is 6.  ?Psychiatric:     ?   Speech: Speech normal.     ?   Behavior: Behavior normal. Behavior is cooperative.  ? ? ?No results found for any visits on 06/16/21. ? ? ?   ?Assessment & Plan:  ? ?Obesity, unspecified classification, unspecified obesity type, unspecified whether serious comorbidity present; Type 2 diabetes mellitus without complication, on Metformin ?Update blood work today ?Instructed patient as follows: ?-Stop metformin 3-5 days prior to starting ozempic ?-Then start weekly Ozempic 0.25 mg ?-Update me in 1-2 weeks (or sooner) via mychart if you want to continue. If not, we will go with oral rybelsus (which is an oral form of ozempic essentially) ? ?Reactive depression ?Restart Wellbutrin 150 mg XL daily ?Denies SI/HI ?Follow-up as needed ? ?Neck pain ?Uncontrolled ?Recommend trialing stretches and topical voltaren ?If no improvement, follow-up and we will get imaging ? ?Hyperlipidemia associated with type 2 diabetes mellitus ([place]) ?Update lipid panel and provide recommendations accordingly ? ? ?  Meds ordered this encounter  ?Medications  ? buPROPion (WELLBUTRIN XL) 150 MG 24 hr tablet  ?  Sig: Take 1 tablet (150 mg total) by mouth daily.  ?  Dispense:  90 tablet  ?  Refill:  3  ?  Order Specific Question:   Supervising Provider  ?  AnswerVivi Barrack [[id number]]  ? ? ?No follow-ups on file. ? ?Inda Coke, PA ? ? ?

## 2021-06-16 NOTE — Patient Instructions (Signed)
It was great to see you! ? ?Stop metformin 3-5 days prior to starting ozempic ? ?Then start weekly Ozempic 0.25 mg ? ?Update me in 1-2 weeks (or sooner) via mychart if you want to continue. If not, we will go with oral rybelsus (which is an oral form of ozempic essentially) ? ?Restart wellbutrin 150 mg ? ?Start topical diclofenac for your neck ?Trial neck stretches  ?If symptoms worsen or do not improve, I want to get imaging -- let me know ? ?Schedule an appointment with Dr. Jerline Pain here at our office for knee injections ? ?Let's follow-up in 3 months, sooner if you have concerns. ? ?Take care, ? ?Inda Coke PA-C  ?

## 2021-06-20 ENCOUNTER — Encounter: Payer: Self-pay | Admitting: Physician Assistant

## 2021-06-20 DIAGNOSIS — F329 Major depressive disorder, single episode, unspecified: Secondary | ICD-10-CM

## 2021-06-21 MED ORDER — BUPROPION HCL ER (XL) 150 MG PO TB24: 150.0000 mg | ORAL_TABLET | Freq: Every day | ORAL | 3 refills | Status: DC

## 2021-06-28 ENCOUNTER — Other Ambulatory Visit: Payer: Self-pay | Admitting: Physician Assistant

## 2021-07-07 LAB — HM DIABETES EYE EXAM

## 2021-07-08 ENCOUNTER — Encounter: Payer: Self-pay | Admitting: Physician Assistant

## 2021-07-10 ENCOUNTER — Other Ambulatory Visit: Payer: Self-pay | Admitting: Physician Assistant

## 2021-07-10 ENCOUNTER — Telehealth: Payer: Self-pay | Admitting: *Deleted

## 2021-07-10 MED ORDER — OZEMPIC (0.25 OR 0.5 MG/DOSE) 2 MG/1.5ML ~~LOC~~ SOPN: 0.5000 mg | PEN_INJECTOR | SUBCUTANEOUS | 1 refills | Status: DC

## 2021-07-10 NOTE — Telephone Encounter (Signed)
Spoke to pt told him Ozempic has been approved by insurance. Samantha sent Rx over today, pharmacy was notified and they said they will have to order it for tomorrow. Please call pharmacy before going to pickup. Also finish dose 0.25 mg before starting next dose 0.5 mg weekly. Pt verbalized understanding.  ?

## 2021-07-10 NOTE — Telephone Encounter (Signed)
Called pharmacy spoke to [employer] told him Ozempic has been approved for one year. Ramesh verbalized understanding and said he will have to order that for tomorrow. Told him okay I will let pt know.  ?

## 2021-07-10 NOTE — Telephone Encounter (Signed)
Robert Gamble, pt agreed to try Cymbalta. Please advise on dose. Pt is aware about taking Ozempic. ?

## 2021-07-10 NOTE — Telephone Encounter (Signed)
Received request for Prior Auth from [employer]. PA done thru [employer]. ?Key: [id number] ?This request has been approved using information available on the patient's profile. CaseId:[id number];Status:Approved;Review Type:Prior Auth;Coverage Start Date:06/10/2021;Coverage End Date:07/10/2022; ?

## 2021-07-11 ENCOUNTER — Other Ambulatory Visit: Payer: Self-pay | Admitting: Physician Assistant

## 2021-07-11 MED ORDER — DULOXETINE HCL 20 MG PO CPEP: 20.0000 mg | ORAL_CAPSULE | Freq: Every day | ORAL | 0 refills | Status: DC

## 2021-08-13 ENCOUNTER — Other Ambulatory Visit: Payer: Self-pay | Admitting: Physician Assistant

## 2021-09-12 ENCOUNTER — Other Ambulatory Visit: Payer: Self-pay | Admitting: Physician Assistant

## 2021-09-22 ENCOUNTER — Ambulatory Visit (INDEPENDENT_AMBULATORY_CARE_PROVIDER_SITE_OTHER): Payer: BC Managed Care – PPO | Admitting: Physician Assistant

## 2021-09-22 ENCOUNTER — Encounter: Payer: Self-pay | Admitting: Physician Assistant

## 2021-09-22 VITALS — BP 120/80 | HR 61 | Temp 98.2°F | Ht 69.0 in | Wt 254.6 lb

## 2021-09-22 DIAGNOSIS — E119 Type 2 diabetes mellitus without complications: Secondary | ICD-10-CM

## 2021-09-22 DIAGNOSIS — F329 Major depressive disorder, single episode, unspecified: Secondary | ICD-10-CM | POA: Diagnosis not present

## 2021-09-22 DIAGNOSIS — I152 Hypertension secondary to endocrine disorders: Secondary | ICD-10-CM

## 2021-09-22 DIAGNOSIS — E1159 Type 2 diabetes mellitus with other circulatory complications: Secondary | ICD-10-CM | POA: Diagnosis not present

## 2021-09-22 MED ORDER — LOSARTAN POTASSIUM 100 MG PO TABS: 100.0000 mg | ORAL_TABLET | Freq: Every day | ORAL | 1 refills | Status: DC

## 2021-09-22 MED ORDER — VERAPAMIL HCL 120 MG PO TABS: 120.0000 mg | ORAL_TABLET | Freq: Three times a day (TID) | ORAL | 1 refills | Status: DC

## 2021-09-22 MED ORDER — METOPROLOL SUCCINATE ER 25 MG PO TB24: 25.0000 mg | ORAL_TABLET | Freq: Every day | ORAL | 1 refills | Status: DC

## 2021-09-22 NOTE — Progress Notes (Signed)
Robert Gamble is a 46 y.o. male here for a follow up of a pre-existing problem.  History of Present Illness:   Chief Complaint  Patient presents with   Follow-up   Hypertension    HPI  HTN Currently taking losartan 100 mg daily, toprol-XL 25 mg daily, verapamil 120 mg TID mg. At home blood pressure readings are: not checked. Patient denies chest pain, SOB, blurred vision, dizziness, unusual headaches, lower leg swelling. Patient is compliant with medication. Denies excessive caffeine intake, stimulant usage, excessive alcohol intake, or increase in salt consumption.  BP Readings from Last 3 Encounters:  09/22/21 120/80  06/16/21 140/80  01/17/21 (!) 156/79   Diabetes 3 month follow-up. Current DM meds: Ozempic 0.5 mg weekly. Blood sugars at home are: not regularly checked. Patient is  compliant with medications. Denies: hypoglycemic or hyperglycemic episodes or symptoms. This patient's diabetes is complicated by HLD and HTN. He had an eye exam two months ago that was WNL.  Lab Results  Component Value Date   HGBA1C 6.7 (H) 06/16/2021   Reactive depression Did not tolerate cymbalta or wellbutrin. Currently not on any medication. He is actually moving about an hour away. He is leaving his job. He will be closer to his parents. This change is helping his mood significantly.  Past Medical History:  Diagnosis Date   Allergic rhinitis    Chronic headache    DM2 (diabetes mellitus, type 2) (HCC)    HTN (hypertension)    OSA on CPAP      Social History   Tobacco Use   Smoking status: Former   Smokeless tobacco: Never  Scientific laboratory technician Use: Never used  Substance Use Topics   Alcohol use: No   Drug use: No    Past Surgical History:  Procedure Laterality Date   APPENDECTOMY      Family History  Problem Relation Age of Onset   Hypertension Father    Sleep apnea Sister    Diabetes Sister    Hyperlipidemia Sister    Hypertension Sister    Mental illness  Sister    Lung cancer Other    Clotting disorder Other    Emphysema Other    Heart disease Other    Cancer Maternal Grandmother    Cancer Maternal Grandfather     Allergies  Allergen Reactions   Pregabalin Shortness Of Breath   Phentermine Palpitations   Ramipril Cough   Statins     Myalgia    Zocor [Simvastatin]    Penicillins Rash    Current Medications:   Current Outpatient Medications:    buPROPion (WELLBUTRIN XL) 150 MG 24 hr tablet, Take 1 tablet (150 mg total) by mouth daily., Disp: 90 tablet, Rfl: 3   DULoxetine (CYMBALTA) 20 MG capsule, Take 1 capsule (20 mg total) by mouth daily., Disp: 90 capsule, Rfl: 0   hydrochlorothiazide (HYDRODIURIL) 25 MG tablet, TAKE 1 TABLET (25 MG TOTAL) BY MOUTH DAILY., Disp: 30 tablet, Rfl: 5   KLOR-CON M10 10 MEQ tablet, TAKE 1 TABLET BY MOUTH EVERY DAY, Disp: 30 tablet, Rfl: 1   losartan (COZAAR) 100 MG tablet, TAKE 1 TABLET BY MOUTH EVERY DAY, Disp: 30 tablet, Rfl: 5   metFORMIN (GLUCOPHAGE-XR) 750 MG 24 hr tablet, TAKE 1 TABLET BY MOUTH EVERY DAY WITH BREAKFAST, Disp: 30 tablet, Rfl: 5   metoprolol succinate (TOPROL-XL) 25 MG 24 hr tablet, TAKE 1 TABLET BY MOUTH EVERY DAY, Disp: 30 tablet, Rfl: 5   Multiple Vitamin (  MULTIVITAMIN) capsule, Take 1 capsule by mouth daily., Disp: , Rfl:    mupirocin ointment (BACTROBAN) 2 %, Apply 1 application topically 2 (two) times daily. (Patient taking differently: Apply 1 application  topically as needed.), Disp: 22 g, Rfl: 0   OZEMPIC, 0.25 OR 0.5 MG/DOSE, 2 MG/3ML SOPN, INJECT 0.5 MG INTO THE SKIN ONCE A WEEK., Disp: 3 mL, Rfl: 1   SUMAtriptan (IMITREX) 100 MG tablet, TAKE 1 TAB AS NEEDWED FOR MIGRAINE, REPEAT IN 2 HRS IF PERSISTS OR RECURS, Disp: 9 tablet, Rfl: 0   verapamil (CALAN) 120 MG tablet, Take 1 tablet (120 mg total) by mouth 3 (three) times daily., Disp: 90 tablet, Rfl: 5   Review of Systems:   ROS Negative unless otherwise specified per HPI.  Vitals:   Vitals:   09/22/21 0803   BP: 120/80  Pulse: 61  Temp: 98.2 F (36.8 C)  SpO2: 95%  Weight: 254 lb 9.6 oz (115.5 kg)  Height: '5\' 9"'$  (1.753 m)     Body mass index is 37.6 kg/m.  Physical Exam:   Physical Exam Vitals and nursing note reviewed.  Constitutional:      General: He is not in acute distress.    Appearance: He is well-developed. He is not ill-appearing or toxic-appearing.  Cardiovascular:     Rate and Rhythm: Normal rate and regular rhythm.     Pulses: Normal pulses.     Heart sounds: Normal heart sounds, S1 normal and S2 normal.  Pulmonary:     Effort: Pulmonary effort is normal.     Breath sounds: Normal breath sounds.  Skin:    General: Skin is warm and dry.  Neurological:     Mental Status: He is alert.     GCS: GCS eye subscore is 4. GCS verbal subscore is 5. GCS motor subscore is 6.  Psychiatric:        Speech: Speech normal.        Behavior: Behavior normal. Behavior is cooperative.     Assessment and Plan:   Hypertension associated with diabetes ([place]), on Losartan, HCTZ, Verapamil Normotensive Continue losartan 100 mg daily, toprol-XL 25 mg daily, verapamil 120 mg TID mg Follow-up in 6 months, sooner if concerns  Type 2 diabetes mellitus without complication, without long-term current use of insulin (HCC) Update A1c and adjust Ozempic 5 mg weekly as indicated Follow-up based on level of control, at a minimum will need to see him in 6 months  Reactive depression Overall stable per patient Denies SI/HI Follow-up in 6 months, sooner if concerns   Inda Coke, PA-C

## 2021-09-22 NOTE — Patient Instructions (Signed)
It was great to see you!  I have refilled your medications   I will refill your ozempic once you get blood work done  Please make an appointment with Dr. Jerline Pain for knee injections on your way out  An order for blood work has been put in for you. To get your blood work, you can walk in at the [facility] location without a scheduled appointment.  The address is [address]. It is across the street from [facility]. Lab located in the basement. Hours of operation are M-F 8:30am to 5:00pm. Please note that they are closed for lunch between 12:30 and 1:00pm.  Take care,  Inda Coke PA-C

## 2021-09-25 ENCOUNTER — Ambulatory Visit (INDEPENDENT_AMBULATORY_CARE_PROVIDER_SITE_OTHER): Payer: BC Managed Care – PPO | Admitting: Family Medicine

## 2021-09-25 ENCOUNTER — Encounter: Payer: Self-pay | Admitting: Family Medicine

## 2021-09-25 VITALS — BP 146/76 | HR 54 | Temp 97.9°F | Ht 69.0 in | Wt 255.6 lb

## 2021-09-25 DIAGNOSIS — G8929 Other chronic pain: Secondary | ICD-10-CM

## 2021-09-25 DIAGNOSIS — M25562 Pain in left knee: Secondary | ICD-10-CM | POA: Diagnosis not present

## 2021-09-25 DIAGNOSIS — E1159 Type 2 diabetes mellitus with other circulatory complications: Secondary | ICD-10-CM | POA: Diagnosis not present

## 2021-09-25 DIAGNOSIS — I152 Hypertension secondary to endocrine disorders: Secondary | ICD-10-CM

## 2021-09-25 DIAGNOSIS — M25561 Pain in right knee: Secondary | ICD-10-CM | POA: Diagnosis not present

## 2021-09-25 DIAGNOSIS — Z1159 Encounter for screening for other viral diseases: Secondary | ICD-10-CM | POA: Diagnosis not present

## 2021-09-25 DIAGNOSIS — E119 Type 2 diabetes mellitus without complications: Secondary | ICD-10-CM

## 2021-09-25 DIAGNOSIS — Z0000 Encounter for general adult medical examination without abnormal findings: Secondary | ICD-10-CM | POA: Diagnosis not present

## 2021-09-25 DIAGNOSIS — I10 Essential (primary) hypertension: Secondary | ICD-10-CM | POA: Diagnosis not present

## 2021-09-25 LAB — LIPID PANEL
Cholesterol: 181 mg/dL (ref 0–200)
HDL: 33.3 mg/dL — ABNORMAL LOW (ref >39.00)
LDL Cholesterol: 111 mg/dL — ABNORMAL HIGH (ref 0–99)
NonHDL: 147.84
Total CHOL/HDL Ratio: 5
Triglycerides: 185 mg/dL — ABNORMAL HIGH (ref 0.0–149.0)
VLDL: 37 mg/dL (ref 0.0–40.0)

## 2021-09-25 LAB — CBC WITH DIFFERENTIAL/PLATELET
Basophils Absolute: 0.1 10*3/uL (ref 0.0–0.1)
Basophils Relative: 1.1 % (ref 0.0–3.0)
Eosinophils Absolute: 0.3 10*3/uL (ref 0.0–0.7)
Eosinophils Relative: 4.6 % (ref 0.0–5.0)
HCT: 41 % (ref 39.0–52.0)
Hemoglobin: 14.1 g/dL (ref 13.0–17.0)
Lymphocytes Relative: 26.2 % (ref 12.0–46.0)
Lymphs Abs: 1.7 10*3/uL (ref 0.7–4.0)
MCHC: 34.4 g/dL (ref 30.0–36.0)
MCV: 85.7 fl (ref 78.0–100.0)
Monocytes Absolute: 0.5 10*3/uL (ref 0.1–1.0)
Monocytes Relative: 8.7 % (ref 3.0–12.0)
Neutro Abs: 3.8 10*3/uL (ref 1.4–7.7)
Neutrophils Relative %: 59.4 % (ref 43.0–77.0)
Platelets: 282 10*3/uL (ref 150.0–400.0)
RBC: 4.79 Mil/uL (ref 4.22–5.81)
RDW: 13.4 % (ref 11.5–15.5)
WBC: 6.3 10*3/uL (ref 4.0–10.5)

## 2021-09-25 LAB — COMPREHENSIVE METABOLIC PANEL
ALT: 31 U/L (ref 0–53)
AST: 28 U/L (ref 0–37)
Albumin: 4.5 g/dL (ref 3.5–5.2)
Alkaline Phosphatase: 58 U/L (ref 39–117)
BUN: 11 mg/dL (ref 6–23)
CO2: 29 mEq/L (ref 19–32)
Calcium: 9.8 mg/dL (ref 8.4–10.5)
Chloride: 103 mEq/L (ref 96–112)
Creatinine, Ser: 0.85 mg/dL (ref 0.40–1.50)
GFR: 105.22 mL/min (ref >60.00)
Glucose, Bld: 78 mg/dL (ref 70–99)
Potassium: 3.4 mEq/L — ABNORMAL LOW (ref 3.5–5.1)
Sodium: 143 mEq/L (ref 135–145)
Total Bilirubin: 0.6 mg/dL (ref 0.2–1.2)
Total Protein: 8.2 g/dL (ref 6.0–8.3)

## 2021-09-25 LAB — HEMOGLOBIN A1C: Hgb A1c MFr Bld: 6 % (ref 4.6–6.5)

## 2021-09-25 MED ORDER — METHYLPREDNISOLONE ACETATE 80 MG/ML IJ SUSP: 80.0000 mg | Freq: Once | INTRAMUSCULAR | Status: DC

## 2021-09-25 MED ORDER — METHYLPREDNISOLONE ACETATE 80 MG/ML IJ SUSP
80.0000 mg | Freq: Once | INTRAMUSCULAR | Status: AC
  Administered 2021-09-25: 80 mg via INTRAMUSCULAR

## 2021-09-25 MED ORDER — METHYLPREDNISOLONE ACETATE 80 MG/ML IJ SUSP
80.0000 mg | Freq: Once | INTRAMUSCULAR | Status: AC
  Administered 2021-09-25: 80 mg via INTRA_ARTICULAR

## 2021-09-25 NOTE — Progress Notes (Signed)
   Robert Gamble is a 46 y.o. male who presents today for an office visit.  Assessment/Plan:  Chronic Problems Addressed Today: Chronic knee pain Aspiration and injection performed today.  See the procedure note.  He tolerated well.  Also discussed home exercises, compression, and ice.  Hypertension Elevated today in setting of acute pain though typically well controlled.  We will continue his current medications losartan milligrams daily and verapamil 120 mg 3 times daily.  He will let us know if this continues to be elevated.    Subjective:  HPI:  See A/p for status chronic conditions. His main concern today is his bilateral knee pain. This has been going on for several years.  He has seen orthopedics in the past.  He has cortisone shots in the past but it worked well.  This was last done about a year ago.  Flared recently due to moving and has been doing a lot of lifting.  Over-the-counter meds have not provided significant improvement.  He would like to have a repeat injection today possible.        Objective:  Physical Exam: BP (!) 146/76   Pulse (!) 54   Temp 97.9 F (36.6 C) (Temporal)   Ht '5\' 9"'$  (1.753 m)   Wt 255 lb 9.6 oz (115.9 kg)   SpO2 99%   BMI 37.75 kg/m   Gen: No acute distress, resting comfortably CV: Regular rate and rhythm with no murmurs appreciated Pulm: Normal work of breathing, clear to auscultation bilaterally with no crackles, wheezes, or rhonchi MSK: - Knee: Slight effusion noted on right knee.  Otherwise bilateral knees without any gross deformities.  Full range of motion throughout.  Crepitus with active and passive range of motion bilaterally. Neuro: Grossly normal, moves all extremities Psych: Normal affect and thought content  Knee Arthrocentesis with Injection Procedure Note  Pre-operative Diagnosis: bilateral Knee Pain  Post-operative Diagnosis: same  Indications: Symptomatic relief of large effusion  Anesthesia: Lidocaine 2%  without epinephrine without added sodium bicarbonate  Procedure Details   Verbal consent was obtained for the procedure. The right joint was prepped with Betadine and a small wheel of anesthetic was injected into the subcutaneous tissue. An 18 gauge needle was inserted into the superior aspect of the joint from a lateral approach. 8 ml of clear yellow fluid was removed from the joint and discarded. 3 ml 1% lidocaine and 1 ml of '80mg'$ /cc depomedrol was then injected into the joint through the same needle. The needle was removed and the area cleansed and dressed.  The left joint was prepped with Betadine.  A small wheal of anesthetic was injected into the subcutaneous tissue.  An 18-gauge needle was inserted into the superior lateral aspect of the joint.  1 mL of clear fluid was removed from the joint discarded. 3 ml 1% lidocaine and 1 ml of '80mg'$ /cc depomedrol was then injected into the joint through the same needle. The needle was removed and the area cleansed and dressed.  Complications:  None; patient tolerated the procedure well.      Algis Greenhouse. Jerline Pain, MD 09/25/2021 12:01 PM

## 2021-09-25 NOTE — Patient Instructions (Signed)
It was very nice to see you today!  We drew fluid off of your knees today and injected cortisone.  Please let us know if you have any worsening pain, redness, or any other signs of infection.  Take care, Dr Jerline Pain  PLEASE NOTE:  If you had any lab tests please let us know if you have not heard back within a few days. You may see your results on mychart before we have a chance to review them but we will give you a call once they are reviewed by [place]. If we ordered any referrals today, please let us know if you have not heard from their office within the next week.   Please try these tips to maintain a healthy lifestyle:  Eat at least 3 REAL meals and 1-2 snacks per day.  Aim for no more than 5 hours between eating.  If you eat breakfast, please do so within one hour of getting up.   Each meal should contain half fruits/vegetables, one quarter protein, and one quarter carbs (no bigger than a computer mouse)  Cut down on sweet beverages. This includes juice, soda, and sweet tea.   Drink at least 1 glass of water with each meal and aim for at least 8 glasses per day  Exercise at least 150 minutes every week.    Knee Injection A knee injection is a procedure to get medicine into your knee joint to relieve the pain, swelling, and stiffness of arthritis. Your health care provider uses a needle to inject medicine, which may also help to lubricate and cushion your knee joint. You may need more than one injection. Tell a health care provider about: Any allergies you have. All medicines you are taking, including vitamins, herbs, eye drops, creams, and over-the-counter medicines. Any problems you or family members have had with anesthetic medicines. Any blood disorders you have. Any surgeries you have had. Any medical conditions you have. Whether you are pregnant or may be pregnant. What are the risks? Generally, this is a safe procedure. However, problems may occur,  including: Infection. Bleeding. Symptoms that get worse. Damage to the area around your knee. Allergic reaction to any of the medicines. Skin reactions from repeated injections. What happens before the procedure? Ask your health care provider about: Changing or stopping your regular medicines. This is especially important if you are taking diabetes medicines or blood thinners. Taking medicines such as aspirin and ibuprofen. These medicines can thin your blood. Do not take these medicines unless your health care provider tells you to take them. Taking over-the-counter medicines, vitamins, herbs, and supplements. Plan to have a responsible adult take you home from the hospital or clinic. What happens during the procedure?  You will sit or lie down in a position for your knee to be treated. The skin over your kneecap will be cleaned with a germ-killing soap. You will be given a medicine that numbs the area (local anesthetic). You may feel some stinging. The medicine will be injected into your knee. The needle is carefully placed between your kneecap and your knee. The medicine is injected into the joint space. The needle will be removed at the end of the procedure. A bandage (dressing) may be placed over the injection site. The procedure may vary among health care providers and hospitals. What can I expect after the procedure? Your blood pressure, heart rate, breathing rate, and blood oxygen level will be monitored until you leave the hospital or clinic. You may have to  move your knee through its full range of motion. This helps to get all the medicine into your joint space. You will be watched to make sure that you do not have a reaction to the injected medicine. You may feel more pain, swelling, and warmth than you did before the injection. This reaction may last about 1-2 days. Follow these instructions at home: Medicines Take over-the-counter and prescription medicines only as told by  your health care provider. Ask your health care provider if the medicine prescribed to you requires you to avoid driving or using machinery. Do not take medicines such as aspirin and ibuprofen unless your health care provider tells you to take them. Injection site care Follow instructions from your health care provider about: How to take care of your puncture site. When and how you should change your dressing. When you should remove your dressing. Check your injection area every day for signs of infection. Check for: More redness, swelling, or pain after 2 days. Fluid or blood. Pus or a bad smell. Warmth. Managing pain, stiffness, and swelling  If directed, put ice on the injection area. To do this: Put ice in a plastic bag. Place a towel between your skin and the bag. Leave the ice on for 20 minutes, 2-3 times per day. Remove the ice if your skin turns bright red. This is very important. If you cannot feel pain, heat, or cold, you have a greater risk of damage to the area. Do not apply heat to your knee. Raise (elevate) the injection area above the level of your heart while you are sitting or lying down. General instructions If you were given a dressing, keep it dry until your health care provider says it can be removed. Ask your health care provider when you can start showering or bathing. Avoid strenuous activities for as long as directed by your health care provider. Ask your health care provider when you can return to your normal activities. Keep all follow-up visits. This is important. You may need more injections. Contact a health care provider if you have: A fever. Warmth in your injection area. Fluid, blood, or pus coming from your injection site. Symptoms at your injection site that last longer than 2 days after your procedure. Get help right away if: Your knee turns very red. Your knee becomes very swollen. Your knee is in severe pain. Summary A knee injection is a  procedure to get medicine into your knee joint to relieve the pain, swelling, and stiffness of arthritis. A needle is carefully placed between your kneecap and your knee to inject medicine into the joint space. Before the procedure, ask your health care provider about changing or stopping your regular medicines, especially if you are taking diabetes medicines or blood thinners. Contact your health care provider if you have any problems or questions after your procedure. This information is not intended to replace advice given to you by your health care provider. Make sure you discuss any questions you have with your health care provider. Document Revised: 07/29/2019 Document Reviewed: 07/29/2019 Elsevier Patient Education  [place].

## 2021-09-26 ENCOUNTER — Other Ambulatory Visit: Payer: Self-pay | Admitting: Physician Assistant

## 2021-09-26 LAB — HEPATITIS C ANTIBODY: Hepatitis C Ab: NONREACTIVE

## 2021-09-26 MED ORDER — OZEMPIC (0.25 OR 0.5 MG/DOSE) 2 MG/3ML ~~LOC~~ SOPN
0.5000 mg | PEN_INJECTOR | SUBCUTANEOUS | 1 refills | Status: DC
Start: 2021-09-26 — End: 2021-12-25

## 2021-11-02 ENCOUNTER — Encounter: Payer: Self-pay | Admitting: Physician Assistant

## 2021-11-16 ENCOUNTER — Encounter: Payer: Self-pay | Admitting: Physician Assistant

## 2021-11-27 ENCOUNTER — Encounter: Payer: Self-pay | Admitting: Physician Assistant

## 2021-11-28 NOTE — Telephone Encounter (Signed)
PA for Ozempic done thru [employer]. Awaiting determination.

## 2021-11-29 NOTE — Telephone Encounter (Signed)
Office notes and lab results faxed to [place] at [phone]. Awaiting response.

## 2021-11-29 NOTE — Telephone Encounter (Signed)
Received determination from [employer] Ozempic 0.5 mg was denied.  [place] line and spoke to [facility], told her calling about denial for Ozempic. Told her pt was on Metformin. Neysha said need to fax over office notes regarding metformin. Fax # [phone]. Told her okay

## 2021-12-04 NOTE — Telephone Encounter (Signed)
Received fax from [place] PA for [place] still denied. Called and spoke to [place], she said need to file an Appeal. She will fax over form to be filled out and fax back over when complete.

## 2021-12-13 NOTE — Telephone Encounter (Signed)
Called [employer] again due to not received fax for an appeal. Spoke to an advocate and she said the PA for Ozempic is denied. Told her I was suppose to get a form to fill out for an appeal, but never received it. The advocate tried to transfer me again, but I could not hold any longer.

## 2021-12-14 NOTE — Telephone Encounter (Signed)
Left message on voicemail to call office.  

## 2021-12-14 NOTE — Telephone Encounter (Signed)
Spoke to pt told him Ozempic has been denied by insurance. I tried to appeal it, but they never sent me the form and when I call I keep getting the run around. Pt verbalized understanding and said he got the letter in the mail that is was denied. Pt said his wife Robert Gamble is going to bring paperwork by the office next week to see if they can get assistance from the company. Told him okay, I will keep an eye out for it. Pt verbalized understanding.

## 2021-12-14 NOTE — Telephone Encounter (Signed)
Pt returning a call to the office. Is asking for a call back

## 2021-12-21 ENCOUNTER — Telehealth: Payer: Self-pay | Admitting: Physician Assistant

## 2021-12-21 NOTE — Telephone Encounter (Signed)
..  Type of form received: [employer] patient assistant form    Additional comments:    Received by: patient drop off/ [employer]   Form should be Faxed [phone]   Form should be mailed to:  na   Is patient requesting call for pickup: yes      Form placed:  sam's folder    Attach charge sheet.  Yes    Individual made aware of 3-5 business day turn around (Y/N)?  Yes

## 2021-12-22 NOTE — Telephone Encounter (Signed)
Left message on voicemail to call office.  

## 2021-12-25 ENCOUNTER — Other Ambulatory Visit: Payer: Self-pay | Admitting: *Deleted

## 2021-12-25 MED ORDER — OZEMPIC (0.25 OR 0.5 MG/DOSE) 2 MG/3ML ~~LOC~~ SOPN: 0.5000 mg | PEN_INJECTOR | SUBCUTANEOUS | 3 refills | Status: AC

## 2021-12-25 NOTE — Telephone Encounter (Signed)
Spoke to [employer] pt's wife, told her you gave me your social security info not patient's. Beth said pt is not working at present. Told her okay I will complete application today and send over. Beth verbalized understanding.

## 2021-12-25 NOTE — Telephone Encounter (Signed)
Application completed for [employer] PAP for Ozempic.  Faxed to [phone].

## 2022-01-10 ENCOUNTER — Ambulatory Visit: Payer: BC Managed Care – PPO | Admitting: Family Medicine

## 2022-01-10 ENCOUNTER — Ambulatory Visit: Payer: Self-pay | Admitting: Family Medicine

## 2022-02-28 ENCOUNTER — Other Ambulatory Visit: Payer: Self-pay | Admitting: Physician Assistant

## 2022-03-01 ENCOUNTER — Other Ambulatory Visit: Payer: Self-pay | Admitting: Physician Assistant

## 2022-03-01 DIAGNOSIS — E1159 Type 2 diabetes mellitus with other circulatory complications: Secondary | ICD-10-CM

## 2022-07-09 ENCOUNTER — Ambulatory Visit: Payer: Self-pay | Admitting: Family Medicine

## 2022-10-02 ENCOUNTER — Other Ambulatory Visit: Payer: Self-pay | Admitting: Physician Assistant
